# Patient Record
Sex: Female | Born: 1980 | Marital: Single | Smoking: Never smoker
Health system: Southern US, Community
[De-identification: ages and names within clinical notes are randomized; demographics above are authoritative.]

## PROBLEM LIST (undated history)

## (undated) DIAGNOSIS — Z992 Dependence on renal dialysis: Secondary | ICD-10-CM

## (undated) DIAGNOSIS — I10 Essential (primary) hypertension: Secondary | ICD-10-CM

## (undated) DIAGNOSIS — J690 Pneumonitis due to inhalation of food and vomit: Secondary | ICD-10-CM

## (undated) DIAGNOSIS — G40909 Epilepsy, unspecified, not intractable, without status epilepticus: Secondary | ICD-10-CM

## (undated) DIAGNOSIS — I5022 Chronic systolic (congestive) heart failure: Secondary | ICD-10-CM

## (undated) DIAGNOSIS — I469 Cardiac arrest, cause unspecified: Secondary | ICD-10-CM

## (undated) DIAGNOSIS — J9621 Acute and chronic respiratory failure with hypoxia: Secondary | ICD-10-CM

## (undated) DIAGNOSIS — N186 End stage renal disease: Secondary | ICD-10-CM

## (undated) DIAGNOSIS — F819 Developmental disorder of scholastic skills, unspecified: Secondary | ICD-10-CM

## (undated) HISTORY — PX: CLOSED REDUCTION PROXIMAL ULNAR FRACTURE: SUR236

---

## 2018-04-30 ENCOUNTER — Other Ambulatory Visit (HOSPITAL_COMMUNITY): Payer: Self-pay

## 2018-04-30 ENCOUNTER — Inpatient Hospital Stay
Admission: AD | Admit: 2018-04-30 | Discharge: 2018-05-20 | Disposition: A | Discharge status: Skilled Nursing Facility | Payer: Medicare Other | Source: Ambulatory Visit | Attending: Internal Medicine | Admitting: Internal Medicine

## 2018-04-30 DIAGNOSIS — J189 Pneumonia, unspecified organism: Secondary | ICD-10-CM

## 2018-04-30 DIAGNOSIS — Z431 Encounter for attention to gastrostomy: Secondary | ICD-10-CM

## 2018-04-30 DIAGNOSIS — J9621 Acute and chronic respiratory failure with hypoxia: Secondary | ICD-10-CM

## 2018-04-30 DIAGNOSIS — J90 Pleural effusion, not elsewhere classified: Secondary | ICD-10-CM

## 2018-04-30 DIAGNOSIS — R609 Edema, unspecified: Secondary | ICD-10-CM

## 2018-04-30 DIAGNOSIS — Z4659 Encounter for fitting and adjustment of other gastrointestinal appliance and device: Secondary | ICD-10-CM

## 2018-04-30 DIAGNOSIS — I5022 Chronic systolic (congestive) heart failure: Secondary | ICD-10-CM | POA: Diagnosis present

## 2018-04-30 DIAGNOSIS — J690 Pneumonitis due to inhalation of food and vomit: Secondary | ICD-10-CM | POA: Diagnosis present

## 2018-04-30 DIAGNOSIS — Z9911 Dependence on respirator [ventilator] status: Secondary | ICD-10-CM

## 2018-04-30 DIAGNOSIS — Z992 Dependence on renal dialysis: Secondary | ICD-10-CM

## 2018-04-30 DIAGNOSIS — O2230 Deep phlebothrombosis in pregnancy, unspecified trimester: Secondary | ICD-10-CM

## 2018-04-30 DIAGNOSIS — N186 End stage renal disease: Secondary | ICD-10-CM

## 2018-04-30 DIAGNOSIS — T17908A Unspecified foreign body in respiratory tract, part unspecified causing other injury, initial encounter: Secondary | ICD-10-CM

## 2018-04-30 DIAGNOSIS — I469 Cardiac arrest, cause unspecified: Secondary | ICD-10-CM | POA: Diagnosis present

## 2018-04-30 DIAGNOSIS — G40909 Epilepsy, unspecified, not intractable, without status epilepticus: Secondary | ICD-10-CM

## 2018-04-30 HISTORY — DX: Dependence on renal dialysis: N18.6

## 2018-04-30 HISTORY — DX: Developmental disorder of scholastic skills, unspecified: F81.9

## 2018-04-30 HISTORY — DX: Cardiac arrest, cause unspecified: I46.9

## 2018-04-30 HISTORY — DX: Essential (primary) hypertension: I10

## 2018-04-30 HISTORY — DX: Epilepsy, unspecified, not intractable, without status epilepticus: G40.909

## 2018-04-30 HISTORY — DX: Dependence on renal dialysis: Z99.2

## 2018-04-30 HISTORY — DX: Chronic systolic (congestive) heart failure: I50.22

## 2018-04-30 HISTORY — DX: Acute and chronic respiratory failure with hypoxia: J96.21

## 2018-04-30 HISTORY — DX: Pneumonitis due to inhalation of food and vomit: J69.0

## 2018-04-30 LAB — BLOOD GAS, ARTERIAL
Acid-Base Excess: 0.7 mmol/L (ref 0.0–2.0)
Bicarbonate: 25.3 mmol/L (ref 20.0–28.0)
FIO2: 40
MECHVT: 310 mL
O2 SAT: 96.6 %
PEEP: 5 cmH2O
Patient temperature: 97.9
RATE: 18 resp/min
pCO2 arterial: 43.6 mmHg (ref 32.0–48.0)
pH, Arterial: 7.38 (ref 7.350–7.450)
pO2, Arterial: 86.6 mmHg (ref 83.0–108.0)

## 2018-05-01 ENCOUNTER — Other Ambulatory Visit (HOSPITAL_COMMUNITY): Payer: Self-pay

## 2018-05-01 ENCOUNTER — Encounter: Payer: Self-pay | Admitting: Internal Medicine

## 2018-05-01 DIAGNOSIS — I5022 Chronic systolic (congestive) heart failure: Secondary | ICD-10-CM | POA: Diagnosis not present

## 2018-05-01 DIAGNOSIS — I469 Cardiac arrest, cause unspecified: Secondary | ICD-10-CM | POA: Diagnosis present

## 2018-05-01 DIAGNOSIS — G40909 Epilepsy, unspecified, not intractable, without status epilepticus: Secondary | ICD-10-CM

## 2018-05-01 DIAGNOSIS — Z992 Dependence on renal dialysis: Secondary | ICD-10-CM

## 2018-05-01 DIAGNOSIS — J9621 Acute and chronic respiratory failure with hypoxia: Secondary | ICD-10-CM | POA: Diagnosis not present

## 2018-05-01 DIAGNOSIS — J690 Pneumonitis due to inhalation of food and vomit: Secondary | ICD-10-CM | POA: Diagnosis not present

## 2018-05-01 DIAGNOSIS — N186 End stage renal disease: Secondary | ICD-10-CM

## 2018-05-01 LAB — CBC WITH DIFFERENTIAL/PLATELET
ABS IMMATURE GRANULOCYTES: 0.5 10*3/uL — AB (ref 0.0–0.1)
Basophils Absolute: 0.1 10*3/uL (ref 0.0–0.1)
Basophils Relative: 1 %
EOS PCT: 3 %
Eosinophils Absolute: 0.5 10*3/uL (ref 0.0–0.7)
HCT: 28.3 % — ABNORMAL LOW (ref 36.0–46.0)
HEMOGLOBIN: 8.4 g/dL — AB (ref 12.0–15.0)
Immature Granulocytes: 3 %
LYMPHS ABS: 0.7 10*3/uL (ref 0.7–4.0)
Lymphocytes Relative: 4 %
MCH: 26.3 pg (ref 26.0–34.0)
MCHC: 29.7 g/dL — AB (ref 30.0–36.0)
MCV: 88.7 fL (ref 78.0–100.0)
MONO ABS: 1.1 10*3/uL — AB (ref 0.1–1.0)
MONOS PCT: 7 %
NEUTROS ABS: 13.7 10*3/uL — AB (ref 1.7–7.7)
NEUTROS PCT: 82 %
Platelets: 327 10*3/uL (ref 150–400)
RBC: 3.19 MIL/uL — ABNORMAL LOW (ref 3.87–5.11)
RDW: 17.8 % — ABNORMAL HIGH (ref 11.5–15.5)
WBC: 16.6 10*3/uL — ABNORMAL HIGH (ref 4.0–10.5)

## 2018-05-01 LAB — COMPREHENSIVE METABOLIC PANEL
ALT: <5 U/L (ref 0–44)
ANION GAP: 13 (ref 5–15)
AST: 20 U/L (ref 15–41)
Albumin: 3.1 g/dL — ABNORMAL LOW (ref 3.5–5.0)
Alkaline Phosphatase: 119 U/L (ref 38–126)
BUN: 23 mg/dL — ABNORMAL HIGH (ref 6–20)
CHLORIDE: 98 mmol/L (ref 98–111)
CO2: 25 mmol/L (ref 22–32)
Calcium: 7.6 mg/dL — ABNORMAL LOW (ref 8.9–10.3)
Creatinine, Ser: 3.56 mg/dL — ABNORMAL HIGH (ref 0.44–1.00)
GFR calc non Af Amer: 15 mL/min — ABNORMAL LOW (ref >60)
GFR, EST AFRICAN AMERICAN: 18 mL/min — AB (ref >60)
Glucose, Bld: 107 mg/dL — ABNORMAL HIGH (ref 70–99)
Potassium: 3.9 mmol/L (ref 3.5–5.1)
Sodium: 136 mmol/L (ref 135–145)
Total Bilirubin: 0.7 mg/dL (ref 0.3–1.2)
Total Protein: 7.1 g/dL (ref 6.5–8.1)

## 2018-05-01 LAB — PREPARE RBC (CROSSMATCH)

## 2018-05-01 LAB — HEMOGLOBIN AND HEMATOCRIT, BLOOD
HCT: 24.6 % — ABNORMAL LOW (ref 36.0–46.0)
HCT: 23 % — ABNORMAL LOW (ref 36.0–46.0)
Hemoglobin: 7.5 g/dL — ABNORMAL LOW (ref 12.0–15.0)
Hemoglobin: 7 g/dL — ABNORMAL LOW (ref 12.0–15.0)

## 2018-05-01 LAB — PHOSPHORUS: PHOSPHORUS: 3.6 mg/dL (ref 2.5–4.6)

## 2018-05-01 LAB — PROTIME-INR
INR: 1.59
Prothrombin Time: 18.8 seconds — ABNORMAL HIGH (ref 11.4–15.2)

## 2018-05-01 LAB — MAGNESIUM: Magnesium: 2.2 mg/dL (ref 1.7–2.4)

## 2018-05-01 LAB — ABO/RH: ABO/RH(D): A POS

## 2018-05-01 MED ORDER — DOCUSATE SODIUM 100 MG PO CAPS
100.00 | ORAL_CAPSULE | ORAL | Status: DC
Start: ? — End: 2018-05-01

## 2018-05-01 MED ORDER — LEVETIRACETAM 100 MG/ML PO SOLN
500.00 | ORAL | Status: DC
Start: 2018-04-30 — End: 2018-05-01

## 2018-05-01 MED ORDER — ONDANSETRON HCL 4 MG/2ML IJ SOLN
4.00 | INTRAMUSCULAR | Status: DC
Start: ? — End: 2018-05-01

## 2018-05-01 MED ORDER — GENERIC EXTERNAL MEDICATION
2.50 | Status: DC
Start: ? — End: 2018-05-01

## 2018-05-01 MED ORDER — GENERIC EXTERNAL MEDICATION
2.25 g | Status: DC
Start: 2018-04-30 — End: 2018-05-01

## 2018-05-01 MED ORDER — ACETAMINOPHEN 325 MG PO TABS
650.00 | ORAL_TABLET | ORAL | Status: DC
Start: ? — End: 2018-05-01

## 2018-05-01 MED ORDER — TRAMADOL HCL 50 MG PO TABS
50.00 | ORAL_TABLET | ORAL | Status: DC
Start: ? — End: 2018-05-01

## 2018-05-01 MED ORDER — CLONIDINE 0.3 MG/24HR TD PTWK
1.00 | MEDICATED_PATCH | TRANSDERMAL | Status: DC
Start: 2018-05-03 — End: 2018-05-01

## 2018-05-01 MED ORDER — GENERIC EXTERNAL MEDICATION
10.00 | Status: DC
Start: ? — End: 2018-05-01

## 2018-05-01 MED ORDER — ASPIRIN 81 MG PO CHEW
81.00 | CHEWABLE_TABLET | ORAL | Status: DC
Start: 2018-05-01 — End: 2018-05-01

## 2018-05-01 MED ORDER — LISINOPRIL 20 MG PO TABS
40.00 | ORAL_TABLET | ORAL | Status: DC
Start: 2018-05-01 — End: 2018-05-01

## 2018-05-01 MED ORDER — FAMOTIDINE 20 MG PO TABS
20.00 | ORAL_TABLET | ORAL | Status: DC
Start: 2018-05-01 — End: 2018-05-01

## 2018-05-01 MED ORDER — SEVELAMER CARBONATE 800 MG PO TABS
800.00 | ORAL_TABLET | ORAL | Status: DC
Start: 2018-04-30 — End: 2018-05-01

## 2018-05-01 MED ORDER — GENERIC EXTERNAL MEDICATION
300.00 | Status: DC
Start: 2018-04-30 — End: 2018-05-01

## 2018-05-01 MED ORDER — OXYCODONE-ACETAMINOPHEN 5-325 MG PO TABS
2.00 | ORAL_TABLET | ORAL | Status: DC
Start: ? — End: 2018-05-01

## 2018-05-01 MED ORDER — SERTRALINE HCL 25 MG PO TABS
25.00 | ORAL_TABLET | ORAL | Status: DC
Start: 2018-05-01 — End: 2018-05-01

## 2018-05-01 MED ORDER — IPRATROPIUM BROMIDE 0.02 % IN SOLN
0.50 | RESPIRATORY_TRACT | Status: DC
Start: ? — End: 2018-05-01

## 2018-05-01 MED ORDER — FUROSEMIDE 40 MG PO TABS
40.00 | ORAL_TABLET | ORAL | Status: DC
Start: 2018-05-01 — End: 2018-05-01

## 2018-05-01 MED ORDER — GENERIC EXTERNAL MEDICATION
.00 | Status: DC
Start: ? — End: 2018-05-01

## 2018-05-01 MED ORDER — ALBUTEROL SULFATE (5 MG/ML) 0.5% IN NEBU
2.50 | INHALATION_SOLUTION | RESPIRATORY_TRACT | Status: DC
Start: ? — End: 2018-05-01

## 2018-05-01 MED ORDER — OXIDIZED CELLULOSE EX PADS
1.00 | MEDICATED_PAD | CUTANEOUS | Status: DC
Start: ? — End: 2018-05-01

## 2018-05-01 MED ORDER — TEMAZEPAM 15 MG PO CAPS
15.00 | ORAL_CAPSULE | ORAL | Status: DC
Start: ? — End: 2018-05-01

## 2018-05-01 MED ORDER — CLONIDINE HCL 0.1 MG PO TABS
0.30 | ORAL_TABLET | ORAL | Status: DC
Start: ? — End: 2018-05-01

## 2018-05-01 MED ORDER — GENERIC EXTERNAL MEDICATION
Status: DC
Start: ? — End: 2018-05-01

## 2018-05-01 MED ORDER — HEPARIN SODIUM (PORCINE) 5000 UNIT/ML IJ SOLN
5000.00 | INTRAMUSCULAR | Status: DC
Start: 2018-04-30 — End: 2018-05-01

## 2018-05-01 MED ORDER — GENERIC EXTERNAL MEDICATION
75.00 | Status: DC
Start: ? — End: 2018-05-01

## 2018-05-01 MED ORDER — LORAZEPAM 2 MG/ML IJ SOLN
0.50 | INTRAMUSCULAR | Status: DC
Start: ? — End: 2018-05-01

## 2018-05-01 MED ORDER — HYDRALAZINE HCL 20 MG/ML IJ SOLN
10.00 | INTRAMUSCULAR | Status: DC
Start: ? — End: 2018-05-01

## 2018-05-01 MED ORDER — SODIUM CHLORIDE 0.9 % IJ SOLN
5.00 | INTRAMUSCULAR | Status: DC
Start: 2018-04-30 — End: 2018-05-01

## 2018-05-01 MED ORDER — AMLODIPINE BESYLATE 5 MG PO TABS
10.00 | ORAL_TABLET | ORAL | Status: DC
Start: 2018-04-30 — End: 2018-05-01

## 2018-05-01 NOTE — Progress Notes (Signed)
Request for diagnostic and therapeutic thoracentesis for new onset left pleural effusion noted on CXR from 8/1. Limited chest US performed at bedside prior to procedure showing minimal amounts of pleural fluid present in left pleural space which is not amenable to drainage today given risk to patient. No procedure performed today, plan discussed with Dr. Odis LusterBowers.   Lynnette CaffeyShannon Watterson, PA-C 05/01/18 1407

## 2018-05-01 NOTE — Consult Note (Signed)
[facility]  Date of Service: 05/01/2018  PULMONARY CONSULT   Lori NobleMegan C Gutierrez  ZOX:[id number]  DOB: 1981-01-28   DOA: 04/30/2018  Referring Physician: Carron CurieAli Hijazi, MD  HPI: Lori Gutierrez is a 37 y.o. female seen for follow up of Acute on Chronic Respiratory Failure.  Patient is orally intubated on the ventilator.  She is critically ill in danger of cardiac arrest.  Patient apparently has a history of end-stage renal disease on dialysis who gets regular dialysis.  She came into the hospital with increasing swelling.  Apparently the swelling is gotten worse within 24 hours.  The patient had some having pain also in the left upper extremity.  When she was seen in the emergency room she was noted to be significantly hypoxic and basically in acute respiratory failure.  Chest x-ray at the time it showed moderate pulmonary vascular congestion.  The x-ray also was done of the upper extremities does not show any significant fractures or displacements.  Patient was admitted to the floor apparently suffered a cardiac arrest and was transferred to the ICU and intubated at that time.  Patient was felt to have aspirated while eating.  Also patient had developed a seizure at the time.  She was loaded with Keppra and also did get the CPR as mentioned with return of spontaneous circulation.  When she was intubated the in the ET tube food particles were noted.  Patient was empirically started on Zosyn at the time for the aspiration pneumonia.  She is transferred to our facility for further management and weaning.  Review of Systems:  ROS performed and is unremarkable other than noted above.  Past Medical History:  Diagnosis Date  . Acute on chronic respiratory failure with hypoxia (HCC)   . Aspiration pneumonia (HCC)   . Cardiac arrest (HCC)   . Chronic systolic heart failure (HCC)   . Delay of cognitive development   . End stage renal disease  on dialysis (HCC)   . Hypertension   . Seizure disorder [facility](HCC)     Past Surgical History:  Procedure Laterality Date  . CLOSED REDUCTION PROXIMAL ULNAR FRACTURE      Social History:    reports that she has never smoked. She has never used smokeless tobacco. She reports that she drank alcohol. She reports that she has current or past drug history.  Family History: Non-Contributory to the present illness  Allergies not on file  Medications: Reviewed on Rounds  Physical Exam:  Vitals: Temperature 97.9 pulse 85 respiratory rate 19 blood pressure 187/88 saturations 100%  Ventilator Settings mode of ventilation assist control FiO2 40% tidal volume 489 PEEP 5  . General: Comfortable at this time . Eyes: Grossly normal lids, irises & conjunctiva . ENT: grossly tongue is normal . Neck: no obvious mass . Cardiovascular: S1-S2 normal no gallop or rub . Respiratory: Coarse breath sounds are noted bilaterally . Abdomen: Soft and nontender . Skin: no rash seen on limited exam . Musculoskeletal: not rigid . Psychiatric:unable to assess . Neurologic: no seizure no involuntary movements         Labs on Admission:  Basic Metabolic Panel: Recent Labs  Lab 05/01/18 0506  NA 136  K 3.9  CL 98  CO2 25  GLUCOSE 107*  BUN 23*  CREATININE 3.56*  CALCIUM 7.6*  MG 2.2  PHOS 3.6    Liver Function Tests: Recent Labs  Lab 05/01/18 0506  AST 20  ALT <5  ALKPHOS 119  BILITOT 0.7  PROT 7.1  ALBUMIN 3.1*   No results for input(s): LIPASE, AMYLASE in the last 168 hours. No results for input(s): AMMONIA in the last 168 hours.  CBC: Recent Labs  Lab 05/01/18 0506  WBC 16.6*  NEUTROABS 13.7*  HGB 8.4*  HCT 28.3*  MCV 88.7  PLT 327    Cardiac Enzymes: No results for input(s): CKTOTAL, CKMB, CKMBINDEX, TROPONINI in the last 168 hours.  BNP (last 3 results) No results for input(s): BNP in the last 8760 hours.  ProBNP (last 3 results) No results for input(s): PROBNP in  the last 8760 hours.   Radiological Exams on Admission: Dg Abd 1 View  Result Date: 04/30/2018 CLINICAL DATA:  NG tube placement. EXAM: ABDOMEN - 1 VIEW COMPARISON:  04/30/2018 FINDINGS: The bowel gas pattern is normal. Residual oral contrast throughout the colon. Enteric catheter has been advanced, tip likely within the distal gastric body. Patchy airspace consolidation the left lower lobe. Right femoral line in place. IMPRESSION: Enteric catheter tip likely within the distal gastric body. Patchy airspace consolidation in the left lower lobe. Electronically Signed   By: Ted Mcalpine M.D.   On: 04/30/2018 18:25   Dg Abd 1 View  Result Date: 04/30/2018 CLINICAL DATA:  37 year old female with a history of nasogastric tube placement EXAM: ABDOMEN - 1 VIEW COMPARISON:  None. FINDINGS: Retained enteric contrast within the left side of the colon. Scoliotic curvature of the thoracolumbar spine. Cardiomegaly. Hemodialysis catheter. Endotracheal tube partially visualized. Gastric tube terminates in the left upper quadrant, with the side-port projecting at the level of the hemidiaphragm, potentially above the GE junction. Opacity at the left lung base. IMPRESSION: Gastric tube terminates in the stomach, with the side-port likely above the GE junction. Opacity at the left lung base, potentially a combination of atelectasis, pleural fluid, and/or consolidation. Electronically Signed   By: Gilmer Mor D.O.   On: 04/30/2018 15:30   Dg Chest Port 1 View  Result Date: 04/30/2018 CLINICAL DATA:  Encounter for ETT placement. EXAM: PORTABLE CHEST 1 VIEW COMPARISON:  Portable film 04/29/2018. FINDINGS: ET tube has been pulled back and now lies 2.2 cm above the carina, in satisfactory position. LEFT lower lobe consolidation, atelectasis, and effusion are increased. Orogastric tube tip lies in the stomach. Dialysis catheter tips RIGHT atrium. Mild diffuse vascular congestion. IMPRESSION: ET tube in satisfactory  position.  Worsening aeration. Electronically Signed   By: Elsie Stain M.D.   On: 04/30/2018 15:35    Assessment/Plan Principal Problem:   Acute on chronic respiratory failure with hypoxia (HCC) Active Problems:   Aspiration pneumonia (HCC)   Seizure disorder (HCC)   Cardiac arrest (HCC)   End stage renal disease on dialysis (HCC)   Chronic systolic heart failure (HCC)   1. Acute on chronic respiratory failure with hypoxia she currently is orally intubated on the ventilator.  We will have respiratory check the spontaneous breathing index and try to start her on a pressure support wean.  Patient's mechanics right now are borderline will need to monitor closely.  In addition chest x-ray shows some worsening aeration so I think she would benefit from dialysis.  Nephrology is to see the patient today hopefully 2. Pneumonia due to aspiration would continue with antibiotics as she was started on the other facility.  Follow-up x-ray as to as necessary. 3. Seizure disorder currently no active seizures are noted. 4. Cardiac arrest rhythm is stable we will continue with present  management. 5. End-stage renal disease awaiting nephrology consultation for dialysis will continue with supportive care. 6. Chronic systolic heart failure need to monitor her fluid status closely chest x-ray results were reviewed with the primary care team will continue with current management  I have personally seen and evaluated the patient, evaluated laboratory and imaging results, formulated the assessment and plan and placed orders. The Patient requires high complexity decision making for assessment and support.  Case was discussed on Rounds with the Respiratory Therapy Staff Time Spent patient is critically ill in danger of cardiac arrest.  She will need ongoing close monitoring.  Patient case was discussed with the primary care team and case team management.  Yevonne Pax, MD [facility] Pulmonary Critical Care  Medicine Sleep Medicine

## 2018-05-01 NOTE — Consult Note (Signed)
[facility] CONSULT NOTE    Date: 05/01/2018                  [name]  MRN: [id number]  DOB: 1981-07-21  Age / Sex: 37 y.o., female         PCP: Default, Provider, MD                 Service Requesting Consult: Hospitalist                 Reason for Consult: Management of ESRD            History of Present Illness: Patient is a 37 y.o. female with a PMHx of recent acute respiratory failure requiring intubation, aspiration pneumonia, cardiac arrest, chronic systolic heart failure, delay of cognitive development, end-stage renal disease on hemodialysis MWF, hypertension, seizure disorder, who was admitted to [facility] on 04/30/2018 for ongoing management of acute respiratory failure, recent PEA arrest, and end-stage renal disease.  [facility].  She unfortunately suffered a PEA arrest probably secondary to aspiration while eating and then had subsequent seizure episode.  She was also found to have right ulnar fracture and a brace was placed.  She was subsequently transferred here for further rehabilitation and weaning from the ventilator.   Medications: Discharge medications from [facility]: . amLODIPine 10 mg Oral Daily@0900   . aspirin 81 mg Oral Daily@0900   . cloNIDine 1 patch Transdermal Weekly  . famotidine 20 mg Per G Tube Daily@0900   . furosemide 40 mg Oral Daily@0900   . heparin (porcine) 5,000 Units Subcutaneous 3 times per day  . hydrALAZINE 50 mg Oral Once  . hydrALAZINE 75 mg Oral TID  . labetalol 300 mg Oral 3 times per day  . levETIRacetam 500 mg Per OG tube 2 times per day  . lisinopril 40 mg Oral Daily@0900   . piperacillin-tazobactam 2.25 g Intravenous Q8H  . sertraline 25 mg Oral Daily@0900   . sevelamer 800 mg Oral TID WC  . sodium chloride 5 mL Intracatheter 3 times per day       Allergies: Lactose   Past  Medical History: Past Medical History:  Diagnosis Date  . Acute on chronic respiratory failure with hypoxia (HCC)   . Aspiration pneumonia (HCC)   . Cardiac arrest (HCC)   . Chronic systolic heart failure (HCC)   . Delay of cognitive development   . End stage renal disease on dialysis (HCC)   . Hypertension   . Seizure disorder [facility])      Past Surgical History: Past Surgical History:  Procedure Laterality Date  . CLOSED REDUCTION PROXIMAL ULNAR FRACTURE       Family History: Family History  Family history unknown: Yes     Social History: Social History   Socioeconomic History  . Marital status: Single    Spouse name: Not on file  . Number of children: Not on file  . Years of education: Not on file  . Highest education level: Not on file  Occupational History  . Not on file  Social Needs  . Financial resource strain: Not on file  . Food insecurity:    Worry: Not on file    Inability: Not on file  . Transportation needs:    Medical: Not on file    Non-medical: Not on file  Tobacco Use  . Smoking status: Never Smoker  .  Smokeless tobacco: Never Used  Substance and Sexual Activity  . Alcohol use: Not Currently  . Drug use: Not Currently  . Sexual activity: Not Currently  Lifestyle  . Physical activity:    Days per week: Not on file    Minutes per session: Not on file  . Stress: Not on file  Relationships  . Social connections:    Talks on phone: Not on file    Gets together: Not on file    Attends religious service: Not on file    Active member of club or organization: Not on file    Attends meetings of clubs or organizations: Not on file    Relationship status: Not on file  . Intimate partner violence:    Fear of current or ex partner: Not on file    Emotionally abused: Not on file    Physically abused: Not on file    Forced sexual activity: Not on file  Other Topics Concern  . Not on file  Social History Narrative  . Not on file     Review  of Systems: Patient unable to provide as shes intubated  Vital Signs: Temperature 97.9 pulse 85 respirations 19 blood pressure 207/80   Physical Exam: General Critically ill appearing   Head: Normocephalic, atraumatic. NG in place  Eyes: Anicteric, EOMI  Nose: Mucous membranes moist, not inflammed, nonerythematous.  Throat: ETT in place  Neck: Supple, trachea midline.  Lungs:  Normal respiratory effort. Clear to auscultation BL without crackles or wheezes.  Heart: S1S2 no rubs  Abdomen:  Soft, NTND, BS present  Extremities: No pretibial edema.  Neurologic: Arousable, in restraints  Skin: No visible rashes, scars.    Lab results: Basic Metabolic Panel: Recent Labs  Lab 05/01/18 0506  NA 136  K 3.9  CL 98  CO2 25  GLUCOSE 107*  BUN 23*  CREATININE 3.56*  CALCIUM 7.6*  MG 2.2  PHOS 3.6    Liver Function Tests: Recent Labs  Lab 05/01/18 0506  AST 20  ALT <5  ALKPHOS 119  BILITOT 0.7  PROT 7.1  ALBUMIN 3.1*   No results for input(s): LIPASE, AMYLASE in the last 168 hours. No results for input(s): AMMONIA in the last 168 hours.  CBC: Recent Labs  Lab 05/01/18 0506 05/01/18 1004  WBC 16.6*  --   NEUTROABS 13.7*  --   HGB 8.4* 7.5*  HCT 28.3* 24.6*  MCV 88.7  --   PLT 327  --     Cardiac Enzymes: No results for input(s): CKTOTAL, CKMB, CKMBINDEX, TROPONINI in the last 168 hours.  BNP: Invalid input(s): POCBNP  CBG: No results for input(s): GLUCAP in the last 168 hours.  Microbiology: No results found for this or any previous visit.  Coagulation Studies: Recent Labs    05/01/18 0506  LABPROT 18.8*  INR 1.59    Urinalysis: No results for input(s): COLORURINE, LABSPEC, PHURINE, GLUCOSEU, HGBUR, BILIRUBINUR, KETONESUR, PROTEINUR, UROBILINOGEN, NITRITE, LEUKOCYTESUR in the last 72 hours.  Invalid input(s): APPERANCEUR    Imaging: Dg Abd 1 View  Result Date: 04/30/2018 CLINICAL DATA:  NG tube placement. EXAM: ABDOMEN - 1 VIEW COMPARISON:   04/30/2018 FINDINGS: The bowel gas pattern is normal. Residual oral contrast throughout the colon. Enteric catheter has been advanced, tip likely within the distal gastric body. Patchy airspace consolidation the left lower lobe. Right femoral line in place. IMPRESSION: Enteric catheter tip likely within the distal gastric body. Patchy airspace consolidation in the left lower lobe. Electronically  Signed   By: Ted Mcalpineobrinka  Dimitrova M.D.   On: 04/30/2018 18:25   Dg Abd 1 View  Result Date: 04/30/2018 CLINICAL DATA:  37 year old female with a history of nasogastric tube placement EXAM: ABDOMEN - 1 VIEW COMPARISON:  None. FINDINGS: Retained enteric contrast within the left side of the colon. Scoliotic curvature of the thoracolumbar spine. Cardiomegaly. Hemodialysis catheter. Endotracheal tube partially visualized. Gastric tube terminates in the left upper quadrant, with the side-port projecting at the level of the hemidiaphragm, potentially above the GE junction. Opacity at the left lung base. IMPRESSION: Gastric tube terminates in the stomach, with the side-port likely above the GE junction. Opacity at the left lung base, potentially a combination of atelectasis, pleural fluid, and/or consolidation. Electronically Signed   By: Gilmer MorJaime  Wagner D.O.   On: 04/30/2018 15:30   Dg Chest Port 1 View  Result Date: 04/30/2018 CLINICAL DATA:  Encounter for ETT placement. EXAM: PORTABLE CHEST 1 VIEW COMPARISON:  Portable film 04/29/2018. FINDINGS: ET tube has been pulled back and now lies 2.2 cm above the carina, in satisfactory position. LEFT lower lobe consolidation, atelectasis, and effusion are increased. Orogastric tube tip lies in the stomach. Dialysis catheter tips RIGHT atrium. Mild diffuse vascular congestion. IMPRESSION: ET tube in satisfactory position.  Worsening aeration. Electronically Signed   By: Elsie StainJohn T Curnes M.D.   On: 04/30/2018 15:35      Assessment & Plan: Pt is a 37 y.o. female with a PMHx of recent  acute respiratory failure requiring intubation, aspiration pneumonia, cardiac arrest, chronic systolic heart failure, delay of cognitive development, end-stage renal disease on hemodialysis MWF, hypertension, seizure disorder, who was admitted to [facility] on 04/30/2018 for ongoing management of acute respiratory failure, recent PEA arrest, and end-stage renal disease.   1.  ESRD on HD.  Serum electrolytes appear acceptable at the moment and patient does not appear volume overloaded.  Therefore we will proceed with hemodialysis treatment tomorrow.  We will prepare orders for this.  2.  Acute respiratory failure.  Secondary to aspiration.  Patient maintain on the ventilator at this time.  Continue ventilatory support.  3.  Anemia of chronic kidney disease.  Hemoglobin currently 7.5.  Consider erythropoietin stimulating agents early next week.  4.  Secondary hyperparathyroidism.  Check PTH, phosphorus today.  5.  Thanks for consultation.

## 2018-05-02 DIAGNOSIS — I5022 Chronic systolic (congestive) heart failure: Secondary | ICD-10-CM | POA: Diagnosis not present

## 2018-05-02 DIAGNOSIS — J9621 Acute and chronic respiratory failure with hypoxia: Secondary | ICD-10-CM | POA: Diagnosis not present

## 2018-05-02 DIAGNOSIS — J90 Pleural effusion, not elsewhere classified: Secondary | ICD-10-CM

## 2018-05-02 DIAGNOSIS — I469 Cardiac arrest, cause unspecified: Secondary | ICD-10-CM | POA: Diagnosis not present

## 2018-05-02 DIAGNOSIS — J690 Pneumonitis due to inhalation of food and vomit: Secondary | ICD-10-CM | POA: Diagnosis not present

## 2018-05-02 LAB — RENAL FUNCTION PANEL
ANION GAP: 15 (ref 5–15)
Albumin: 2.8 g/dL — ABNORMAL LOW (ref 3.5–5.0)
BUN: 32 mg/dL — ABNORMAL HIGH (ref 6–20)
CO2: 24 mmol/L (ref 22–32)
Calcium: 7.5 mg/dL — ABNORMAL LOW (ref 8.9–10.3)
Chloride: 93 mmol/L — ABNORMAL LOW (ref 98–111)
Creatinine, Ser: 3.99 mg/dL — ABNORMAL HIGH (ref 0.44–1.00)
GFR, EST AFRICAN AMERICAN: 16 mL/min — AB (ref >60)
GFR, EST NON AFRICAN AMERICAN: 13 mL/min — AB (ref >60)
Glucose, Bld: 100 mg/dL — ABNORMAL HIGH (ref 70–99)
PHOSPHORUS: 4.3 mg/dL (ref 2.5–4.6)
Potassium: 4.1 mmol/L (ref 3.5–5.1)
SODIUM: 132 mmol/L — AB (ref 135–145)

## 2018-05-02 LAB — CBC
HCT: 25.7 % — ABNORMAL LOW (ref 36.0–46.0)
HEMOGLOBIN: 8 g/dL — AB (ref 12.0–15.0)
MCH: 27 pg (ref 26.0–34.0)
MCHC: 31.1 g/dL (ref 30.0–36.0)
MCV: 86.8 fL (ref 78.0–100.0)
Platelets: 289 10*3/uL (ref 150–400)
RBC: 2.96 MIL/uL — AB (ref 3.87–5.11)
RDW: 17.8 % — ABNORMAL HIGH (ref 11.5–15.5)
WBC: 12.9 10*3/uL — AB (ref 4.0–10.5)

## 2018-05-02 LAB — HEPATITIS B SURFACE ANTIBODY,QUALITATIVE: HEP B S AB: REACTIVE

## 2018-05-02 LAB — HEPATITIS B CORE ANTIBODY, TOTAL: Hep B Core Total Ab: NEGATIVE

## 2018-05-02 LAB — CALCIUM, IONIZED: CALCIUM, IONIZED, SERUM: 3.9 mg/dL — AB (ref 4.5–5.6)

## 2018-05-02 LAB — PTH, INTACT AND CALCIUM
Calcium, Total (PTH): 7.3 mg/dL — ABNORMAL LOW (ref 8.7–10.2)
PTH: 166 pg/mL — ABNORMAL HIGH (ref 15–65)

## 2018-05-02 LAB — PARATHYROID HORMONE, INTACT (NO CA): PTH: 131 pg/mL — ABNORMAL HIGH (ref 15–65)

## 2018-05-02 LAB — HEPATITIS B SURFACE ANTIGEN: Hepatitis B Surface Ag: NEGATIVE

## 2018-05-02 NOTE — Progress Notes (Signed)
[facility]  PROGRESS NOTE  Date of Service: 05/02/2018  Lori Gutierrez  ZOX:[id number]  DOB: Feb 06, 1981   DOA: 04/30/2018  Referring Physician: Carron CurieAli Hijazi, MD  HPI: Lori Gutierrez is a 37 y.o. female seen for follow up of Acute on Chronic Respiratory Failure.  Patient remains on the ventilator full support right now has been on assist control mode being dialyzed at this time  Medications: Reviewed on Rounds  Physical Exam:  Vitals: Temperature 96.1 pulse 84 respiratory 18 blood pressure 122/85 saturations 100%  Ventilator Settings mode of ventilation assist control FiO2 40% tidal volume 456 PEEP 5  . General: Comfortable at this time . Eyes: Grossly normal lids, irises & conjunctiva . ENT: grossly tongue is normal . Neck: no obvious mass . Cardiovascular: S1 S2 normal no gallop . Respiratory: No rhonchi or rales . Abdomen: soft . Skin: no rash seen on limited exam . Musculoskeletal: not rigid . Psychiatric:unable to assess . Neurologic: no seizure no involuntary movements         Lab Data:   Basic Metabolic Panel: Recent Labs  Lab 05/01/18 0506 05/01/18 2346  NA 136 132*  K 3.9 4.1  CL 98 93*  CO2 25 24  GLUCOSE 107* 100*  BUN 23* 32*  CREATININE 3.56* 3.99*  CALCIUM 7.6* 7.5*  MG 2.2  --   PHOS 3.6 4.3    Liver Function Tests: Recent Labs  Lab 05/01/18 0506 05/01/18 2346  AST 20  --   ALT <5  --   ALKPHOS 119  --   BILITOT 0.7  --   PROT 7.1  --   ALBUMIN 3.1* 2.8*   No results for input(s): LIPASE, AMYLASE in the last 168 hours. No results for input(s): AMMONIA in the last 168 hours.  CBC: Recent Labs  Lab 05/01/18 0506 05/01/18 1004 05/01/18 1845 05/01/18 2346  WBC 16.6*  --   --  12.9*  NEUTROABS 13.7*  --   --   --   HGB 8.4* 7.5* 7.0* 8.0*  HCT 28.3* 24.6* 23.0* 25.7*  MCV 88.7  --   --  86.8  PLT 327  --   --  289    Cardiac Enzymes: No results for  input(s): CKTOTAL, CKMB, CKMBINDEX, TROPONINI in the last 168 hours.  BNP (last 3 results) No results for input(s): BNP in the last 8760 hours.  ProBNP (last 3 results) No results for input(s): PROBNP in the last 8760 hours.  Radiological Exams: Dg Abd 1 View  Result Date: 04/30/2018 CLINICAL DATA:  NG tube placement. EXAM: ABDOMEN - 1 VIEW COMPARISON:  04/30/2018 FINDINGS: The bowel gas pattern is normal. Residual oral contrast throughout the colon. Enteric catheter has been advanced, tip likely within the distal gastric body. Patchy airspace consolidation the left lower lobe. Right femoral line in place. IMPRESSION: Enteric catheter tip likely within the distal gastric body. Patchy airspace consolidation in the left lower lobe. Electronically Signed   By: Ted Mcalpineobrinka  Dimitrova M.D.   On: 04/30/2018 18:25   Dg Abd 1 View  Result Date: 04/30/2018 CLINICAL DATA:  37 year old female with a history of nasogastric tube placement EXAM: ABDOMEN - 1 VIEW COMPARISON:  None. FINDINGS: Retained enteric contrast within the left side of the colon. Scoliotic curvature of the thoracolumbar spine. Cardiomegaly. Hemodialysis catheter. Endotracheal tube partially visualized. Gastric tube terminates in the left upper quadrant, with the side-port projecting at the level of the hemidiaphragm,  potentially above the GE junction. Opacity at the left lung base. IMPRESSION: Gastric tube terminates in the stomach, with the side-port likely above the GE junction. Opacity at the left lung base, potentially a combination of atelectasis, pleural fluid, and/or consolidation. Electronically Signed   By: Gilmer Mor D.O.   On: 04/30/2018 15:30   [place] Chest (pleural Effusion)  Result Date: 05/01/2018 CLINICAL DATA:  37 year old female with a left-sided pleural effusion presents for attempted thoracentesis. EXAM: CHEST ULTRASOUND COMPARISON:  Chest x-ray 04/30/2018 FINDINGS: Sonographic interrogation of the left chest demonstrates a  trace pleural effusion. There is insufficient fluid for thoracentesis. IMPRESSION: Trace left pleural effusion, insufficient for thoracentesis. Electronically Signed   By: Malachy Moan M.D.   On: 05/01/2018 16:50   Dg Chest Port 1 View  Result Date: 04/30/2018 CLINICAL DATA:  Encounter for ETT placement. EXAM: PORTABLE CHEST 1 VIEW COMPARISON:  Portable film 04/29/2018. FINDINGS: ET tube has been pulled back and now lies 2.2 cm above the carina, in satisfactory position. LEFT lower lobe consolidation, atelectasis, and effusion are increased. Orogastric tube tip lies in the stomach. Dialysis catheter tips RIGHT atrium. Mild diffuse vascular congestion. IMPRESSION: ET tube in satisfactory position.  Worsening aeration. Electronically Signed   By: Elsie Stain M.D.   On: 04/30/2018 15:35    Assessment/Plan Principal Problem:   Acute on chronic respiratory failure with hypoxia (HCC) Active Problems:   Aspiration pneumonia (HCC)   Seizure disorder (HCC)   Cardiac arrest (HCC)   End stage renal disease on dialysis (HCC)   Chronic systolic heart failure (HCC)   1. Acute on chronic respiratory failure with hypoxia doing fine with the current vent settings.  Respiratory therapy will assess the spontaneous breathing index and try to start weaning. 2. Pneumonia due to aspiration treated we will monitor follow-up x-rays 3. Seizure disorder no active seizures noted at this time 4. Status post cardiac arrest rhythm is stable 5. End-stage renal disease on hemodialysis we will continue to follow 6. Chronic systolic heart failure she had bilateral effusions noted on the x-ray right now radiology does not appear to think that she has enough fluid to be drained   I have personally seen and evaluated the patient, evaluated laboratory and imaging results, formulated the assessment and plan and placed orders. The Patient requires high complexity decision making for assessment and support.  Case was  discussed on Rounds with the Respiratory Therapy Staff  Yevonne Pax, MD [facility] Pulmonary Critical Care Medicine Sleep Medicine

## 2018-05-03 DIAGNOSIS — J9621 Acute and chronic respiratory failure with hypoxia: Secondary | ICD-10-CM | POA: Diagnosis not present

## 2018-05-03 DIAGNOSIS — I5022 Chronic systolic (congestive) heart failure: Secondary | ICD-10-CM | POA: Diagnosis not present

## 2018-05-03 DIAGNOSIS — I469 Cardiac arrest, cause unspecified: Secondary | ICD-10-CM | POA: Diagnosis not present

## 2018-05-03 DIAGNOSIS — J690 Pneumonitis due to inhalation of food and vomit: Secondary | ICD-10-CM | POA: Diagnosis not present

## 2018-05-03 LAB — CULTURE, RESPIRATORY

## 2018-05-03 LAB — PTH, INTACT AND CALCIUM
CALCIUM TOTAL (PTH): 7.2 mg/dL — AB (ref 8.7–10.2)
PTH: 130 pg/mL — ABNORMAL HIGH (ref 15–65)

## 2018-05-03 LAB — HEMOGLOBIN AND HEMATOCRIT, BLOOD
HEMATOCRIT: 24.1 % — AB (ref 36.0–46.0)
Hemoglobin: 7.3 g/dL — ABNORMAL LOW (ref 12.0–15.0)

## 2018-05-03 LAB — CULTURE, RESPIRATORY W GRAM STAIN: Culture: NORMAL

## 2018-05-03 NOTE — Progress Notes (Signed)
Pulmonary Critical Care Medicine [facility] [place]   PULMONARY SERVICE  PROGRESS NOTE  Date of Service: 05/03/2018  Lori Gutierrez  ZOX:[id number]  DOB: [birth year]   DOA: 04/30/2018  Referring Physician: Carron Curie, MD  HPI: Lori Gutierrez is a 37 y.o. female seen for follow up of Acute on Chronic Respiratory Failure.  At this time no distress patient is on 40% FiO2 with a PEEP of 5  Medications: Reviewed on Rounds  Physical Exam:  Vitals: Temperature 97.4 pulse 69 respiratory 18 blood pressure 118/73 saturations 100%  Ventilator Settings mode of ventilation assist control FiO2 40% tidal volume 300 PEEP 5  . General: Comfortable at this time . Eyes: Grossly normal lids, irises & conjunctiva . ENT: grossly tongue is normal . Neck: no obvious mass . Cardiovascular: S1 S2 normal no gallop . Respiratory: No rhonchi or rales are noted at this time . Abdomen: soft . Skin: no rash seen on limited exam . Musculoskeletal: not rigid . Psychiatric:unable to assess . Neurologic: no seizure no involuntary movements         Lab Data:   Basic Metabolic Panel: Recent Labs  Lab 05/01/18 0506 05/01/18 2346  NA 136 132*  K 3.9 4.1  CL 98 93*  CO2 25 24  GLUCOSE 107* 100*  BUN 23* 32*  CREATININE 3.56* 3.99*  CALCIUM 7.6*  7.3* 7.5*  MG 2.2  --   PHOS 3.6 4.3    Liver Function Tests: Recent Labs  Lab 05/01/18 0506 05/01/18 2346  AST 20  --   ALT <5  --   ALKPHOS 119  --   BILITOT 0.7  --   PROT 7.1  --   ALBUMIN 3.1* 2.8*   No results for input(s): LIPASE, AMYLASE in the last 168 hours. No results for input(s): AMMONIA in the last 168 hours.  CBC: Recent Labs  Lab 05/01/18 0506 05/01/18 1004 05/01/18 1845 05/01/18 2346 05/03/18 0622  WBC 16.6*  --   --  12.9*  --   NEUTROABS 13.7*  --   --   --   --   HGB 8.4* 7.5* 7.0* 8.0* 7.3*  HCT 28.3* 24.6* 23.0* 25.7* 24.1*  MCV 88.7  --   --  86.8  --   PLT 327  --   --  289  --     Cardiac  Enzymes: No results for input(s): CKTOTAL, CKMB, CKMBINDEX, TROPONINI in the last 168 hours.  BNP (last 3 results) No results for input(s): BNP in the last 8760 hours.  ProBNP (last 3 results) No results for input(s): PROBNP in the last 8760 hours.  Radiological Exams: [place] Chest (pleural Effusion)  Result Date: 05/01/2018 CLINICAL DATA:  37 year old female with a left-sided pleural effusion presents for attempted thoracentesis. EXAM: CHEST ULTRASOUND COMPARISON:  Chest x-ray 04/30/2018 FINDINGS: Sonographic interrogation of the left chest demonstrates a trace pleural effusion. There is insufficient fluid for thoracentesis. IMPRESSION: Trace left pleural effusion, insufficient for thoracentesis. Electronically Signed   By: Malachy Moan M.D.   On: 05/01/2018 16:50    Assessment/Plan Principal Problem:   Acute on chronic respiratory failure with hypoxia (HCC) Active Problems:   Aspiration pneumonia (HCC)   Seizure disorder (HCC)   Cardiac arrest (HCC)   End stage renal disease on dialysis (HCC)   Chronic systolic heart failure (HCC)   1. Acute on chronic respiratory failure with hypoxia patient is comfortable at this time on full support.  Mechanics are poor she is not  able to wean.  Recommended getting an ENT consultation for tracheostomy as she is not likely able to come off the ventilator. 2. Pneumonia due to aspiration treated we will continue to follow 3. Seizure disorder continue present management. 4. Cardiac arrest at baseline no arrhythmias 5. End-stage renal disease on dialysis will follow 6. Chronic systolic heart failure compensated   I have personally seen and evaluated the patient, evaluated laboratory and imaging results, formulated the assessment and plan and placed orders. The Patient requires high complexity decision making for assessment and support.  Case was discussed on Rounds with the Respiratory Therapy Staff  Yevonne PaxSaadat A Khan, MD [facility] Pulmonary Critical  Care Medicine Sleep Medicine

## 2018-05-04 DIAGNOSIS — J9621 Acute and chronic respiratory failure with hypoxia: Secondary | ICD-10-CM | POA: Diagnosis not present

## 2018-05-04 DIAGNOSIS — I5022 Chronic systolic (congestive) heart failure: Secondary | ICD-10-CM | POA: Diagnosis not present

## 2018-05-04 DIAGNOSIS — I469 Cardiac arrest, cause unspecified: Secondary | ICD-10-CM | POA: Diagnosis not present

## 2018-05-04 DIAGNOSIS — J690 Pneumonitis due to inhalation of food and vomit: Secondary | ICD-10-CM | POA: Diagnosis not present

## 2018-05-04 LAB — HEPATITIS B CORE ANTIBODY, IGM: Hep B C IgM: NEGATIVE

## 2018-05-04 LAB — HEPATITIS B SURFACE ANTIGEN: Hepatitis B Surface Ag: NEGATIVE

## 2018-05-04 LAB — HEPATITIS B SURFACE ANTIBODY, QUANTITATIVE: HEPATITIS B-POST: 112.5 m[IU]/mL

## 2018-05-04 LAB — HEMOGLOBIN AND HEMATOCRIT, BLOOD
HEMATOCRIT: 25.4 % — AB (ref 36.0–46.0)
HEMOGLOBIN: 7.7 g/dL — AB (ref 12.0–15.0)

## 2018-05-04 NOTE — Progress Notes (Signed)
[facility] [place]   PULMONARY SERVICE  PROGRESS NOTE  Date of Service: 05/04/2018  Lori Gutierrez  ZOX:[id number]:[id number]  DOB: Jan 24, 1981   DOA: 04/30/2018  Referring Physician: Carron CurieAli Hijazi, MD  HPI: Lori Gutierrez is a 37 y.o. female seen for follow up of Acute on Chronic Respiratory Failure.  Patient is resting comfortably without distress remains on assist control mode patient's been on 40% oxygen with good saturations.  Medications: Reviewed on Rounds  Physical Exam:  Vitals: Temperature is 96.5 pulse 80 respiratory rate 24 blood pressure 140/95 oxygen saturations are 95%  Ventilator Settings mode of ventilation assist control FiO2 40% tidal volume 310 PEEP 5  . General: Comfortable at this time . Eyes: Grossly normal lids, irises & conjunctiva . ENT: grossly tongue is normal . Neck: no obvious mass . Cardiovascular: S1 S2 normal no gallop . Respiratory: Coarse breath sounds no rhonchi are noted at this time . Abdomen: soft . Skin: no rash seen on limited exam . Musculoskeletal: not rigid . Psychiatric:unable to assess . Neurologic: no seizure no involuntary movements         Lab Data:   Basic Metabolic Panel: Recent Labs  Lab 05/01/18 0506 05/01/18 2346  NA 136 132*  K 3.9 4.1  CL 98 93*  CO2 25 24  GLUCOSE 107* 100*  BUN 23* 32*  CREATININE 3.56* 3.99*  CALCIUM 7.6*  7.3* 7.5*  7.2*  MG 2.2  --   PHOS 3.6 4.3    Liver Function Tests: Recent Labs  Lab 05/01/18 0506 05/01/18 2346  AST 20  --   ALT <5  --   ALKPHOS 119  --   BILITOT 0.7  --   PROT 7.1  --   ALBUMIN 3.1* 2.8*   No results for input(s): LIPASE, AMYLASE in the last 168 hours. No results for input(s): AMMONIA in the last 168 hours.  CBC: Recent Labs  Lab 05/01/18 0506 05/01/18 1004 05/01/18 1845 05/01/18 2346 05/03/18 0622 05/04/18 0632  WBC 16.6*  --   --  12.9*  --   --   NEUTROABS 13.7*  --   --   --   --   --   HGB 8.4* 7.5* 7.0*  8.0* 7.3* 7.7*  HCT 28.3* 24.6* 23.0* 25.7* 24.1* 25.4*  MCV 88.7  --   --  86.8  --   --   PLT 327  --   --  289  --   --     Cardiac Enzymes: No results for input(s): CKTOTAL, CKMB, CKMBINDEX, TROPONINI in the last 168 hours.  BNP (last 3 results) No results for input(s): BNP in the last 8760 hours.  ProBNP (last 3 results) No results for input(s): PROBNP in the last 8760 hours.  Radiological Exams: No results found.  Assessment/Plan Principal Problem:   Acute on chronic respiratory failure with hypoxia (HCC) Active Problems:   Aspiration pneumonia (HCC)   Seizure disorder (HCC)   Cardiac arrest (HCC)   End stage renal disease on dialysis (HCC)   Chronic systolic heart failure (HCC)   1. Acute on chronic respiratory failure with hypoxia patient is going to continue with full vent support currently on assist control has been on PEEP of 5 patient's tidal volumes of 310 cc.  We will recheck the spontaneous breathing index to see if she is amenable she is failed mall several times.  Continue with supportive care.  ET tube is in place she will  need a tracheostomy as previously noted 2. Aspiration pneumonia treated with antibiotics we will continue present management 3. Seizure disorder no active seizures are noted at this time. 4. Cardiac arrest rhythm is stable 5. End-stage renal disease on hemodialysis followed by nephrology 6. Chronic systolic heart failure appears to be compensated at this time.   I have personally seen and evaluated the patient, evaluated laboratory and imaging results, formulated the assessment and plan and placed orders. The Patient requires high complexity decision making for assessment and support.  Case was discussed on Rounds with the Respiratory Therapy Staff time 35 minutes multidisciplinary rounds, discussion and also discussion with primary care team  Yevonne Pax, MD [facility] Pulmonary Critical Care Medicine Sleep Medicine

## 2018-05-04 NOTE — Progress Notes (Signed)
[facility]  ROUNDING NOTE   Subjective:  Patient remains critically ill. Still on the ventilator. FiO2 35%.   Objective:  Vital signs in last 24 hours:  Temperature 96.5 pulse 80 respirations 24 blood pressure 140/100  Physical Exam: General: Critically ill appearing  Head: [place]/AT ETT in place  Eyes: Anicteric  Neck: Supple, trachea midline  Lungs:  Scattered rhonchi, vent assisted  Heart: S1S2 no rubs  Abdomen:  Soft, nontender, bowel sounds present  Extremities: trace peripheral edema.  Neurologic: Awake, not following commands  Skin: No lesions       Basic Metabolic Panel: Recent Labs  Lab 05/01/18 0506 05/01/18 2346  NA 136 132*  K 3.9 4.1  CL 98 93*  CO2 25 24  GLUCOSE 107* 100*  BUN 23* 32*  CREATININE 3.56* 3.99*  CALCIUM 7.6*  7.3* 7.5*  7.2*  MG 2.2  --   PHOS 3.6 4.3    Liver Function Tests: Recent Labs  Lab 05/01/18 0506 05/01/18 2346  AST 20  --   ALT <5  --   ALKPHOS 119  --   BILITOT 0.7  --   PROT 7.1  --   ALBUMIN 3.1* 2.8*   No results for input(s): LIPASE, AMYLASE in the last 168 hours. No results for input(s): AMMONIA in the last 168 hours.  CBC: Recent Labs  Lab 05/01/18 0506 05/01/18 1004 05/01/18 1845 05/01/18 2346 05/03/18 0622 05/04/18 0632  WBC 16.6*  --   --  12.9*  --   --   NEUTROABS 13.7*  --   --   --   --   --   HGB 8.4* 7.5* 7.0* 8.0* 7.3* 7.7*  HCT 28.3* 24.6* 23.0* 25.7* 24.1* 25.4*  MCV 88.7  --   --  86.8  --   --   PLT 327  --   --  289  --   --     Cardiac Enzymes: No results for input(s): CKTOTAL, CKMB, CKMBINDEX, TROPONINI in the last 168 hours.  BNP: Invalid input(s): POCBNP  CBG: No results for input(s): GLUCAP in the last 168 hours.  Microbiology: Results for orders placed or performed during the hospital encounter of 04/30/18  Culture, respiratory (non-expectorated)     Status: None   Collection Time: 05/01/18  7:52 AM  Result Value Ref Range Status   Specimen  Description TRACHEAL ASPIRATE  Final   Special Requests NONE  Final   Gram Stain   Final    ABUNDANT WBC PRESENT,BOTH PMN AND MONONUCLEAR NO ORGANISMS SEEN    Culture   Final    RARE Consistent with normal respiratory flora. Performed at [facility], [address] [place], [place] [address]    Report Status 05/03/2018 FINAL  Final    Coagulation Studies: No results for input(s): LABPROT, INR in the last 72 hours.  Urinalysis: No results for input(s): COLORURINE, LABSPEC, PHURINE, GLUCOSEU, HGBUR, BILIRUBINUR, KETONESUR, PROTEINUR, UROBILINOGEN, NITRITE, LEUKOCYTESUR in the last 72 hours.  Invalid input(s): APPERANCEUR    Imaging: No results found.   Medications:       Assessment/ Plan:  37 y.o. female with a PMHx of recent acute respiratory failure requiring intubation, aspiration pneumonia, cardiac arrest, chronic systolic heart failure, delay of cognitive development, end-stage renal disease on hemodialysis MWF, hypertension, seizure disorder, who was admitted to [facility] on 04/30/2018 for ongoing management of acute respiratory failure, recent PEA arrest, and end-stage renal disease.   1.  ESRD on HD.  We will maintain patient on hemodialysis schedule of Tuesday, Thursday, Saturday.  Therefore next treatment tomorrow.  2.  Acute respiratory failure.  Secondary to aspiration.   -Patient still requiring ventilatory support.  Continue mechanical ventilation at this time.  3.  Anemia of chronic kidney disease.    Hemoglobin 7.7.  Start the patient on retacrit [id number] units IV with HD.   4.  Secondary hyperparathyroidism.  Phosphorus 4.3 and at target.  Repeat value tomorrow.     LOS: 0 Fynley Chrystal 8/5/20198:28 AM

## 2018-05-05 DIAGNOSIS — I5022 Chronic systolic (congestive) heart failure: Secondary | ICD-10-CM | POA: Diagnosis not present

## 2018-05-05 DIAGNOSIS — J690 Pneumonitis due to inhalation of food and vomit: Secondary | ICD-10-CM | POA: Diagnosis not present

## 2018-05-05 DIAGNOSIS — I469 Cardiac arrest, cause unspecified: Secondary | ICD-10-CM | POA: Diagnosis not present

## 2018-05-05 DIAGNOSIS — J9621 Acute and chronic respiratory failure with hypoxia: Secondary | ICD-10-CM | POA: Diagnosis not present

## 2018-05-05 LAB — CBC
HEMATOCRIT: 23.7 % — AB (ref 36.0–46.0)
HEMOGLOBIN: 7.4 g/dL — AB (ref 12.0–15.0)
MCH: 26.6 pg (ref 26.0–34.0)
MCHC: 31.2 g/dL (ref 30.0–36.0)
MCV: 85.3 fL (ref 78.0–100.0)
Platelets: 335 10*3/uL (ref 150–400)
RBC: 2.78 MIL/uL — ABNORMAL LOW (ref 3.87–5.11)
RDW: 18.5 % — AB (ref 11.5–15.5)
WBC: 13.6 10*3/uL — ABNORMAL HIGH (ref 4.0–10.5)

## 2018-05-05 LAB — RENAL FUNCTION PANEL
ALBUMIN: 2.6 g/dL — AB (ref 3.5–5.0)
ANION GAP: 17 — AB (ref 5–15)
BUN: 45 mg/dL — AB (ref 6–20)
CO2: 23 mmol/L (ref 22–32)
Calcium: 8 mg/dL — ABNORMAL LOW (ref 8.9–10.3)
Chloride: 89 mmol/L — ABNORMAL LOW (ref 98–111)
Creatinine, Ser: 3.99 mg/dL — ABNORMAL HIGH (ref 0.44–1.00)
GFR calc Af Amer: 16 mL/min — ABNORMAL LOW (ref >60)
GFR calc non Af Amer: 13 mL/min — ABNORMAL LOW (ref >60)
GLUCOSE: 94 mg/dL (ref 70–99)
PHOSPHORUS: 4.6 mg/dL (ref 2.5–4.6)
POTASSIUM: 5.7 mmol/L — AB (ref 3.5–5.1)
Sodium: 129 mmol/L — ABNORMAL LOW (ref 135–145)

## 2018-05-05 LAB — BPAM RBC
BLOOD PRODUCT EXPIRATION DATE: 201908262359
Blood Product Expiration Date: 201908262359
ISSUE DATE / TIME: 201908020146
UNIT TYPE AND RH: 6200
UNIT TYPE AND RH: 6200

## 2018-05-05 LAB — TYPE AND SCREEN
ABO/RH(D): A POS
ANTIBODY SCREEN: NEGATIVE
UNIT DIVISION: 0
Unit division: 0

## 2018-05-05 NOTE — Progress Notes (Addendum)
Pulmonary Critical Care Medicine [facility] [place]   PULMONARY SERVICE  PROGRESS NOTE  Date of Service: 05/05/2018  Lori Gutierrez  ZOX:[id number]  DOB: [birth year]   DOA: 04/30/2018  Referring Physician: Carron Curie, MD  HPI: Lori Gutierrez is a 37 y.o. female seen for follow up of Acute on Chronic Respiratory Failure.  Patient is currently on full support has been on assist control mode has not been attempted on pressure support and has failed.  Patient has been on 30% oxygen.  Patient remains critically ill she is on propofol drip for sedation.  She was a little bit more awake.  Patient was orally intubated  Medications: Reviewed on Rounds  Physical Exam:  Vitals: Temperature 97.1 pulse 86 respiratory rate 29 blood pressure 154/102 saturations 95%  Ventilator Settings currently on assist control FiO2 30% tidal volume 590 PEEP 5  . General: Comfortable at this time . Eyes: Grossly normal lids, irises & conjunctiva . ENT: grossly tongue is normal . Neck: no obvious mass . Cardiovascular: S1 S2 normal no gallop . Respiratory: No rhonchi or rales are noted . Abdomen: soft . Skin: no rash seen on limited exam . Musculoskeletal: not rigid . Psychiatric:unable to assess . Neurologic: no seizure no involuntary movements         Lab Data:   Basic Metabolic Panel: Recent Labs  Lab 05/01/18 0506 05/01/18 2346 05/05/18 0654  NA 136 132* 129*  K 3.9 4.1 5.7*  CL 98 93* 89*  CO2 25 24 23   GLUCOSE 107* 100* 94  BUN 23* 32* 45*  CREATININE 3.56* 3.99* 3.99*  CALCIUM 7.6*  7.3* 7.5*  7.2* 8.0*  MG 2.2  --   --   PHOS 3.6 4.3 4.6    Liver Function Tests: Recent Labs  Lab 05/01/18 0506 05/01/18 2346 05/05/18 0654  AST 20  --   --   ALT <5  --   --   ALKPHOS 119  --   --   BILITOT 0.7  --   --   PROT 7.1  --   --   ALBUMIN 3.1* 2.8* 2.6*   No results for input(s): LIPASE, AMYLASE in the last 168 hours. No results for input(s): AMMONIA in the last 168  hours.  CBC: Recent Labs  Lab 05/01/18 0506  05/01/18 1845 05/01/18 2346 05/03/18 0622 05/04/18 0632 05/05/18 0654  WBC 16.6*  --   --  12.9*  --   --  13.6*  NEUTROABS 13.7*  --   --   --   --   --   --   HGB 8.4*   < > 7.0* 8.0* 7.3* 7.7* 7.4*  HCT 28.3*   < > 23.0* 25.7* 24.1* 25.4* 23.7*  MCV 88.7  --   --  86.8  --   --  85.3  PLT 327  --   --  289  --   --  335   < > = values in this interval not displayed.    Cardiac Enzymes: No results for input(s): CKTOTAL, CKMB, CKMBINDEX, TROPONINI in the last 168 hours.  BNP (last 3 results) No results for input(s): BNP in the last 8760 hours.  ProBNP (last 3 results) No results for input(s): PROBNP in the last 8760 hours.  Radiological Exams: No results found.  Assessment/Plan Principal Problem:   Acute on chronic respiratory failure with hypoxia [facility]) Active Problems:   Aspiration pneumonia (HCC)   Seizure disorder (HCC)   Cardiac arrest (  HCC)   End stage renal disease on dialysis (HCC)   Chronic systolic heart failure (HCC)   1. Acute on chronic respiratory failure with hypoxia we will continue with full support.  Continue pulmonary toilet secretion management.  Assess the spontaneous breathing index is already noted above patient has been failing his spontaneous breathing trials.  Patient is to have a trach 2. Pneumonia due to aspiration treated with antibiotics we will follow-up x-ray as necessary 3. Seizure disorder grossly unchanged 4. Status post cardiac arrest no arrhythmias are noted at baseline 5. End-stage renal disease followed by nephrology for dialysis 6. Chronic systolic heart failure at baseline continue present management   I have personally seen and evaluated the patient, evaluated laboratory and imaging results, formulated the assessment and plan and placed orders. The Patient requires high complexity decision making for assessment and support.  Case was discussed on Rounds with the Respiratory  Therapy Staff time 35 minutes patient is critically ill in danger of cardiac arrest.  As mentioned above will need a tracheostomy for ongoing airway support  Yevonne PaxSaadat A Tyreka Henneke, MD [facility] Pulmonary Critical Care Medicine Sleep Medicine

## 2018-05-06 DIAGNOSIS — I469 Cardiac arrest, cause unspecified: Secondary | ICD-10-CM | POA: Diagnosis not present

## 2018-05-06 DIAGNOSIS — J9621 Acute and chronic respiratory failure with hypoxia: Secondary | ICD-10-CM | POA: Diagnosis not present

## 2018-05-06 DIAGNOSIS — J690 Pneumonitis due to inhalation of food and vomit: Secondary | ICD-10-CM | POA: Diagnosis not present

## 2018-05-06 DIAGNOSIS — I5022 Chronic systolic (congestive) heart failure: Secondary | ICD-10-CM | POA: Diagnosis not present

## 2018-05-06 LAB — BASIC METABOLIC PANEL
ANION GAP: 15 (ref 5–15)
BUN: 38 mg/dL — ABNORMAL HIGH (ref 6–20)
CHLORIDE: 90 mmol/L — AB (ref 98–111)
CO2: 27 mmol/L (ref 22–32)
Calcium: 7.9 mg/dL — ABNORMAL LOW (ref 8.9–10.3)
Creatinine, Ser: 3.01 mg/dL — ABNORMAL HIGH (ref 0.44–1.00)
GFR calc Af Amer: 23 mL/min — ABNORMAL LOW (ref >60)
GFR calc non Af Amer: 20 mL/min — ABNORMAL LOW (ref >60)
GLUCOSE: 94 mg/dL (ref 70–99)
POTASSIUM: 3.6 mmol/L (ref 3.5–5.1)
Sodium: 132 mmol/L — ABNORMAL LOW (ref 135–145)

## 2018-05-06 LAB — PHOSPHORUS: PHOSPHORUS: 4 mg/dL (ref 2.5–4.6)

## 2018-05-06 LAB — MAGNESIUM: Magnesium: 1.9 mg/dL (ref 1.7–2.4)

## 2018-05-06 NOTE — Progress Notes (Addendum)
[facility] [place]   PULMONARY SERVICE  PROGRESS NOTE  Date of Service: 05/06/2018  Lori NobleMegan C Gutierrez  ZOX:[id number]:[id number]  DOB: 1981-07-01   DOA: 04/30/2018  Referring Physician: Carron CurieAli Hijazi, MD  HPI: Lori Gutierrez is a 37 y.o. female seen for follow up of Acute on Chronic Respiratory Failure.  She remains on full support has to be sedated because she was increasingly agitated.  Patient is on propofol drip which is being titrated.  She remains critically ill with the endotracheal tube in place she is a high risk airway at this time.  Medications: Reviewed on Rounds  Physical Exam:  Vitals: Temperature 97.3 pulse 105 respiratory rate 12 blood pressure 173/84 saturation 94%  Ventilator Settings mode of ventilation assist control FiO2 30% tidal volume 425 PEEP 5  . General: Comfortable at this time . Eyes: Grossly normal lids, irises & conjunctiva . ENT: grossly tongue is normal . Neck: no obvious mass . Cardiovascular: S1 S2 normal no gallop . Respiratory: No rhonchi rales are noted at this time . Abdomen: soft . Skin: no rash seen on limited exam . Musculoskeletal: not rigid . Psychiatric:unable to assess . Neurologic: no seizure no involuntary movements         Lab Data:   Basic Metabolic Panel: Recent Labs  Lab 05/01/18 0506 05/01/18 2346 05/05/18 0654 05/06/18 0540  NA 136 132* 129* 132*  K 3.9 4.1 5.7* 3.6  CL 98 93* 89* 90*  CO2 25 24 23 27   GLUCOSE 107* 100* 94 94  BUN 23* 32* 45* 38*  CREATININE 3.56* 3.99* 3.99* 3.01*  CALCIUM 7.6*  7.3* 7.5*  7.2* 8.0* 7.9*  MG 2.2  --   --  1.9  PHOS 3.6 4.3 4.6 4.0    Liver Function Tests: Recent Labs  Lab 05/01/18 0506 05/01/18 2346 05/05/18 0654  AST 20  --   --   ALT <5  --   --   ALKPHOS 119  --   --   BILITOT 0.7  --   --   PROT 7.1  --   --   ALBUMIN 3.1* 2.8* 2.6*   No results for input(s): LIPASE, AMYLASE in the last 168 hours. No results for input(s):  AMMONIA in the last 168 hours.  CBC: Recent Labs  Lab 05/01/18 0506  05/01/18 1845 05/01/18 2346 05/03/18 0622 05/04/18 0632 05/05/18 0654  WBC 16.6*  --   --  12.9*  --   --  13.6*  NEUTROABS 13.7*  --   --   --   --   --   --   HGB 8.4*   < > 7.0* 8.0* 7.3* 7.7* 7.4*  HCT 28.3*   < > 23.0* 25.7* 24.1* 25.4* 23.7*  MCV 88.7  --   --  86.8  --   --  85.3  PLT 327  --   --  289  --   --  335   < > = values in this interval not displayed.    Cardiac Enzymes: No results for input(s): CKTOTAL, CKMB, CKMBINDEX, TROPONINI in the last 168 hours.  BNP (last 3 results) No results for input(s): BNP in the last 8760 hours.  ProBNP (last 3 results) No results for input(s): PROBNP in the last 8760 hours.  Radiological Exams: No results found.  Assessment/Plan Principal Problem:   Acute on chronic respiratory failure with hypoxia (HCC) Active Problems:   Aspiration pneumonia (HCC)   Seizure disorder (  HCC)   Cardiac arrest (HCC)   End stage renal disease on dialysis (HCC)   Chronic systolic heart failure (HCC)   1. Acute on chronic respiratory failure with hypoxia we will continue with full vent support as Artie mentioned patient is on assist control mode right now requiring 30% oxygen.  Patient also requiring sedation because she is a danger of pulling out the ET tube.  We are waiting for tracheostomy apparently has been seen by ENT. 2. Pneumonia due to aspiration treated with antibiotics we will continue to follow 3. Seizure disorder no active seizures are noted 4. Cardiac arrest rhythm has been stable 5. End-stage renal disease currently is on dialysis will follow 6. Chronic systolic heart failure baseline monitor fluid status   I have personally seen and evaluated the patient, evaluated laboratory and imaging results, formulated the assessment and plan and placed orders. The Patient requires high complexity decision making for assessment and support.  Case was discussed on  Rounds with the Respiratory Therapy Staff time spent 35 minutes patient is critically ill in danger of cardiac arrest.  Case discussed on rounds with respiratory therapy case management as well as the primary care team  Yevonne Pax, MD [facility] Pulmonary Critical Care Medicine Sleep Medicine

## 2018-05-06 NOTE — Progress Notes (Signed)
[facility]  ROUNDING NOTE   Subjective:  Critical illness persist at this time. Continues to have labile blood pressure. Patient remains on the ventilator with FiO2 of 30%.   Objective:  Vital signs in last 24 hours:  Temperature 97.3 pulse 105 respirations 18 blood pressure 173/104  Physical Exam: General: Critically ill appearing  Head: [place]/AT ETT/NG in place  Eyes: Anicteric  Neck: Supple, trachea midline  Lungs:  Scattered rhonchi, vent assisted  Heart: S1S2 no rubs  Abdomen:  Soft, nontender, bowel sounds present  Extremities: trace peripheral edema.  Neurologic: Sleeping, not following commands  Skin: No lesions  Access: L IJ permcath    Basic Metabolic Panel: Recent Labs  Lab 05/01/18 0506 05/01/18 2346 05/05/18 0654 05/06/18 0540  NA 136 132* 129* 132*  K 3.9 4.1 5.7* 3.6  CL 98 93* 89* 90*  CO2 25 24 23 27   GLUCOSE 107* 100* 94 94  BUN 23* 32* 45* 38*  CREATININE 3.56* 3.99* 3.99* 3.01*  CALCIUM 7.6*  7.3* 7.5*  7.2* 8.0* 7.9*  MG 2.2  --   --  1.9  PHOS 3.6 4.3 4.6 4.0    Liver Function Tests: Recent Labs  Lab 05/01/18 0506 05/01/18 2346 05/05/18 0654  AST 20  --   --   ALT <5  --   --   ALKPHOS 119  --   --   BILITOT 0.7  --   --   PROT 7.1  --   --   ALBUMIN 3.1* 2.8* 2.6*   No results for input(s): LIPASE, AMYLASE in the last 168 hours. No results for input(s): AMMONIA in the last 168 hours.  CBC: Recent Labs  Lab 05/01/18 0506  05/01/18 1845 05/01/18 2346 05/03/18 0622 05/04/18 0632 05/05/18 0654  WBC 16.6*  --   --  12.9*  --   --  13.6*  NEUTROABS 13.7*  --   --   --   --   --   --   HGB 8.4*   < > 7.0* 8.0* 7.3* 7.7* 7.4*  HCT 28.3*   < > 23.0* 25.7* 24.1* 25.4* 23.7*  MCV 88.7  --   --  86.8  --   --  85.3  PLT 327  --   --  289  --   --  335   < > = values in this interval not displayed.    Cardiac Enzymes: No results for input(s): CKTOTAL, CKMB, CKMBINDEX, TROPONINI in the last 168  hours.  BNP: Invalid input(s): POCBNP  CBG: No results for input(s): GLUCAP in the last 168 hours.  Microbiology: Results for orders placed or performed during the hospital encounter of 04/30/18  Culture, respiratory (non-expectorated)     Status: None   Collection Time: 05/01/18  7:52 AM  Result Value Ref Range Status   Specimen Description TRACHEAL ASPIRATE  Final   Special Requests NONE  Final   Gram Stain   Final    ABUNDANT WBC PRESENT,BOTH PMN AND MONONUCLEAR NO ORGANISMS SEEN    Culture   Final    RARE Consistent with normal respiratory flora. Performed [facility], [address] [place], [place] [address]    Report Status 05/03/2018 FINAL  Final    Coagulation Studies: No results for input(s): LABPROT, INR in the last 72 hours.  Urinalysis: No results for input(s): COLORURINE, LABSPEC, PHURINE, GLUCOSEU, HGBUR, BILIRUBINUR, KETONESUR, PROTEINUR, UROBILINOGEN, NITRITE, LEUKOCYTESUR in the last 72 hours.  Invalid input(s):  APPERANCEUR    Imaging: No results found.   Medications:       Assessment/ Plan:  37 y.o. female with a PMHx of recent acute respiratory failure requiring intubation, aspiration pneumonia, cardiac arrest, chronic systolic heart failure, delay of cognitive development, end-stage renal disease on hemodialysis MWF, hypertension, seizure disorder, who was admitted to [facility] on 04/30/2018 for ongoing management of acute respiratory failure, recent PEA arrest, and end-stage renal disease.   1.  ESRD on HD.    We will plan for dialysis again tomorrow.  Orders to be prepared.  2.  Acute respiratory failure.  Secondary to aspiration.   -Remains on the ventilator with FiO2 of 30%.  She would likely end up requiring tracheostomy placement.  3.  Anemia of chronic kidney disease.    Hemoglobin currently 7.4.  Continues retacrit 40981 units IV with HD.   4.  Secondary hyperparathyroidism.  Phos 4.0, continue renvela powder.    5.  Hypertension:  Blood pressure still labile, will increase hydralazine to 100mg  po qid.     LOS: 0 Lori Gutierrez 8/7/201911:19 AM

## 2018-05-07 DIAGNOSIS — J690 Pneumonitis due to inhalation of food and vomit: Secondary | ICD-10-CM | POA: Diagnosis not present

## 2018-05-07 DIAGNOSIS — I5022 Chronic systolic (congestive) heart failure: Secondary | ICD-10-CM | POA: Diagnosis not present

## 2018-05-07 DIAGNOSIS — I469 Cardiac arrest, cause unspecified: Secondary | ICD-10-CM | POA: Diagnosis not present

## 2018-05-07 DIAGNOSIS — J9621 Acute and chronic respiratory failure with hypoxia: Secondary | ICD-10-CM | POA: Diagnosis not present

## 2018-05-07 LAB — CBC
HEMATOCRIT: 23.3 % — AB (ref 36.0–46.0)
HEMOGLOBIN: 7.3 g/dL — AB (ref 12.0–15.0)
MCH: 27 pg (ref 26.0–34.0)
MCHC: 31.3 g/dL (ref 30.0–36.0)
MCV: 86.3 fL (ref 78.0–100.0)
Platelets: 325 10*3/uL (ref 150–400)
RBC: 2.7 MIL/uL — ABNORMAL LOW (ref 3.87–5.11)
RDW: 19.1 % — ABNORMAL HIGH (ref 11.5–15.5)
WBC: 13.6 10*3/uL — ABNORMAL HIGH (ref 4.0–10.5)

## 2018-05-07 LAB — RENAL FUNCTION PANEL
ALBUMIN: 2.5 g/dL — AB (ref 3.5–5.0)
ANION GAP: 20 — AB (ref 5–15)
BUN: 52 mg/dL — ABNORMAL HIGH (ref 6–20)
CO2: 25 mmol/L (ref 22–32)
Calcium: 8.2 mg/dL — ABNORMAL LOW (ref 8.9–10.3)
Chloride: 86 mmol/L — ABNORMAL LOW (ref 98–111)
Creatinine, Ser: 3.53 mg/dL — ABNORMAL HIGH (ref 0.44–1.00)
GFR calc Af Amer: 18 mL/min — ABNORMAL LOW (ref >60)
GFR, EST NON AFRICAN AMERICAN: 16 mL/min — AB (ref >60)
GLUCOSE: 82 mg/dL (ref 70–99)
PHOSPHORUS: 4.1 mg/dL (ref 2.5–4.6)
POTASSIUM: 3.7 mmol/L (ref 3.5–5.1)
Sodium: 131 mmol/L — ABNORMAL LOW (ref 135–145)

## 2018-05-07 LAB — PREPARE RBC (CROSSMATCH)

## 2018-05-07 NOTE — Anesthesia Preprocedure Evaluation (Addendum)
Anesthesia Evaluation  Patient identified by MRN, date of birth, ID band Patient awake    Reviewed: Allergy & Precautions, H&P , NPO status , Patient's Chart, lab work & pertinent test results, reviewed documented beta blocker date and time   Airway Mallampati: II  TM Distance: >3 FB Neck ROM: full    Dental no notable dental hx.    Pulmonary pneumonia,    Pulmonary exam normal breath sounds clear to auscultation       Cardiovascular Exercise Tolerance: Good hypertension,  Rhythm:regular Rate:Normal     Neuro/Psych Seizures -,  negative psych ROS   GI/Hepatic negative GI ROS, Neg liver ROS,   Endo/Other  negative endocrine ROS  Renal/GU CRFRenal disease  negative genitourinary   Musculoskeletal negative musculoskeletal ROS (+)   Abdominal   Peds  Hematology negative hematology ROS (+)   Anesthesia Other Findings 1. Acute on chronic respiratory failure with hypoxia patient is doing fine with the full vent support on assist control mode right now.  Patient has been on 28% FiO2 tidal volume 430 PEEP 5.  As noted patient is scheduled for tracheostomy in the morning by ENT. 2. Pneumonia due to aspiration continue with present management supportive care 3. Seizure disorder at baseline we will continue to monitor 4. Cardiac arrest rhythm has been stable 5. End-stage renal disease on dialysis continue with hemodialysis as per nephrology recommendations. 6. Chronic systolic heart failure right now is compensated we will continue to monitor  Reproductive/Obstetrics negative OB ROS                           Anesthesia Physical Anesthesia Plan  ASA: IV  Anesthesia Plan: General   Post-op Pain Management:    Induction:   PONV Risk Score and Plan: 3 and Ondansetron and Treatment may vary due to age or medical condition  Airway Management Planned: Tracheostomy  Additional Equipment:   Intra-op  Plan:   Post-operative Plan: Post-operative intubation/ventilation  Informed Consent: I have reviewed the patients History and Physical, chart, labs and discussed the procedure including the risks, benefits and alternatives for the proposed anesthesia with the patient or authorized representative who has indicated his/her understanding and acceptance.   Dental Advisory Given  Plan Discussed with: CRNA  Anesthesia Plan Comments:        Anesthesia Quick Evaluation

## 2018-05-07 NOTE — Progress Notes (Addendum)
Pulmonary Critical Care Medicine [facility]   PULMONARY SERVICE  PROGRESS NOTE  Date of Service: 05/07/2018  Lori NobleMegan C Agosto  WJX:[id number]  DOB: [birth year]   DOA: 04/30/2018  Referring Physician: Carron CurieAli Hijazi, MD  HPI: Lori Gutierrez is a 37 y.o. female seen for follow up of Acute on Chronic Respiratory Failure.  Patient is on full vent support orally intubated.  She still is on dipper Zenaida NieceVan has had some breakthrough with movements noted.  Patient's tracheostomy is scheduled for the morning.  Her airway is still high risk with danger of cardiac arrest  Medications: Reviewed on Rounds  Physical Exam:  Vitals: Temperature 97.4 pulse 92 respiratory 18 blood pressure 162/97 saturations 100%  Ventilator Settings mode of ventilation is assist control FiO2 28% tidal volume 430 PEEP 5  . General: Comfortable at this time . Eyes: Grossly normal lids, irises & conjunctiva . ENT: grossly tongue is normal . Neck: no obvious mass . Cardiovascular: S1 S2 normal no gallop . Respiratory: Good air entry no rhonchi or rales are noted . Abdomen: soft . Skin: no rash seen on limited exam . Musculoskeletal: not rigid . Psychiatric:unable to assess . Neurologic: no seizure no involuntary movements         Lab Data:   Basic Metabolic Panel: Recent Labs  Lab 05/01/18 0506 05/01/18 2346 05/05/18 0654 05/06/18 0540 05/07/18 0457  NA 136 132* 129* 132* 131*  K 3.9 4.1 5.7* 3.6 3.7  CL 98 93* 89* 90* 86*  CO2 25 24 23 27 25   GLUCOSE 107* 100* 94 94 82  BUN 23* 32* 45* 38* 52*  CREATININE 3.56* 3.99* 3.99* 3.01* 3.53*  CALCIUM 7.6*  7.3* 7.5*  7.2* 8.0* 7.9* 8.2*  MG 2.2  --   --  1.9  --   PHOS 3.6 4.3 4.6 4.0 4.1    Liver Function Tests: Recent Labs  Lab 05/01/18 0506 05/01/18 2346 05/05/18 0654 05/07/18 0457  AST 20  --   --   --   ALT <5  --   --   --   ALKPHOS 119  --   --   --   BILITOT 0.7  --   --   --   PROT 7.1  --   --   --   ALBUMIN 3.1* 2.8* 2.6*  2.5*   No results for input(s): LIPASE, AMYLASE in the last 168 hours. No results for input(s): AMMONIA in the last 168 hours.  CBC: Recent Labs  Lab 05/01/18 0506  05/01/18 2346 05/03/18 0622 05/04/18 [id number] 05/05/18 0654 05/07/18 0457  WBC 16.6*  --  12.9*  --   --  13.6* 13.6*  NEUTROABS 13.7*  --   --   --   --   --   --   HGB 8.4*   < > 8.0* 7.3* 7.7* 7.4* 7.3*  HCT 28.3*   < > 25.7* 24.1* 25.4* 23.7* 23.3*  MCV 88.7  --  86.8  --   --  85.3 86.3  PLT 327  --  289  --   --  335 325   < > = values in this interval not displayed.    Cardiac Enzymes: No results for input(s): CKTOTAL, CKMB, CKMBINDEX, TROPONINI in the last 168 hours.  BNP (last 3 results) No results for input(s): BNP in the last 8760 hours.  ProBNP (last 3 results) No results for input(s): PROBNP in the last 8760 hours.  Radiological Exams: No results found.  Assessment/Plan Principal Problem:   Acute on chronic respiratory failure with hypoxia (HCC) Active Problems:   Aspiration pneumonia (HCC)   Seizure disorder (HCC)   Cardiac arrest (HCC)   End stage renal disease on dialysis (HCC)   Chronic systolic heart failure (HCC)   1. Acute on chronic respiratory failure with hypoxia patient is doing fine with the full vent support on assist control mode right now.  Patient has been on 28% FiO2 tidal volume 430 PEEP 5.  As noted patient is scheduled for tracheostomy in the morning by ENT. 2. Pneumonia due to aspiration continue with present management supportive care 3. Seizure disorder at baseline we will continue to monitor 4. Cardiac arrest rhythm has been stable 5. End-stage renal disease on dialysis continue with hemodialysis as per nephrology recommendations. 6. Chronic systolic heart failure right now is compensated we will continue to monitor   I have personally seen and evaluated the patient, evaluated laboratory and imaging results, formulated the assessment and plan and placed orders. The  Patient requires high complexity decision making for assessment and support.  Case was discussed on Rounds with the Respiratory Therapy Staff time spent 35 minutes patient has high risk airway assessment.  Discussed with primary care team as well as the respiratory therapist on rounds  Yevonne Pax, MD [facility] Pulmonary Critical Care Medicine Sleep Medicine

## 2018-05-08 ENCOUNTER — Institutional Professional Consult (permissible substitution) (HOSPITAL_COMMUNITY): Payer: Self-pay | Admitting: Anesthesiology

## 2018-05-08 ENCOUNTER — Inpatient Hospital Stay: Admission: RE | Admit: 2018-05-08 | Payer: Self-pay | Source: Ambulatory Visit

## 2018-05-08 ENCOUNTER — Encounter (HOSPITAL_COMMUNITY)
Admission: AD | Disposition: A | Discharge status: Skilled Nursing Facility | Payer: Self-pay | Source: Ambulatory Visit | Attending: Internal Medicine

## 2018-05-08 ENCOUNTER — Encounter: Payer: Self-pay | Admitting: Anesthesiology

## 2018-05-08 DIAGNOSIS — J9621 Acute and chronic respiratory failure with hypoxia: Secondary | ICD-10-CM | POA: Diagnosis not present

## 2018-05-08 DIAGNOSIS — J690 Pneumonitis due to inhalation of food and vomit: Secondary | ICD-10-CM | POA: Diagnosis not present

## 2018-05-08 DIAGNOSIS — I469 Cardiac arrest, cause unspecified: Secondary | ICD-10-CM | POA: Diagnosis not present

## 2018-05-08 DIAGNOSIS — I5022 Chronic systolic (congestive) heart failure: Secondary | ICD-10-CM | POA: Diagnosis not present

## 2018-05-08 HISTORY — PX: TRACHEOSTOMY TUBE PLACEMENT: SHX814

## 2018-05-08 LAB — TYPE AND SCREEN
ABO/RH(D): A POS
ANTIBODY SCREEN: NEGATIVE
Unit division: 0

## 2018-05-08 LAB — BPAM RBC
BLOOD PRODUCT EXPIRATION DATE: 201908312359
ISSUE DATE / TIME: 201908081551
UNIT TYPE AND RH: 6200

## 2018-05-08 LAB — CBC
HEMATOCRIT: 25.4 % — AB (ref 36.0–46.0)
HEMOGLOBIN: 8.3 g/dL — AB (ref 12.0–15.0)
MCH: 28.3 pg (ref 26.0–34.0)
MCHC: 32.7 g/dL (ref 30.0–36.0)
MCV: 86.7 fL (ref 78.0–100.0)
Platelets: 297 10*3/uL (ref 150–400)
RBC: 2.93 MIL/uL — AB (ref 3.87–5.11)
RDW: 18.5 % — ABNORMAL HIGH (ref 11.5–15.5)
WBC: 11.2 10*3/uL — AB (ref 4.0–10.5)

## 2018-05-08 SURGERY — CREATION, TRACHEOSTOMY
Anesthesia: General | Site: Neck

## 2018-05-08 MED ORDER — LIDOCAINE-EPINEPHRINE 1 %-1:100000 IJ SOLN
INTRAMUSCULAR | Status: DC | PRN
  Administered 2018-05-08: 5 mL

## 2018-05-08 MED ORDER — ROCURONIUM BROMIDE 10 MG/ML (PF) SYRINGE
PREFILLED_SYRINGE | INTRAVENOUS | Status: AC
  Filled 2018-05-08: qty 10

## 2018-05-08 MED ORDER — 0.9 % SODIUM CHLORIDE (POUR BTL) OPTIME
TOPICAL | Status: DC | PRN
  Administered 2018-05-08: 1000 mL

## 2018-05-08 MED ORDER — STERILE WATER FOR IRRIGATION IR SOLN
Status: DC | PRN
  Administered 2018-05-08: 1000 mL

## 2018-05-08 MED ORDER — SODIUM CHLORIDE 0.9 % IV SOLN
INTRAVENOUS | Status: DC | PRN
  Administered 2018-05-08: 08:00:00 via INTRAVENOUS

## 2018-05-08 MED ORDER — DEXAMETHASONE SODIUM PHOSPHATE 10 MG/ML IJ SOLN
INTRAMUSCULAR | Status: DC | PRN
  Administered 2018-05-08: 10 mg via INTRAVENOUS

## 2018-05-08 MED ORDER — ONDANSETRON HCL 4 MG/2ML IJ SOLN
INTRAMUSCULAR | Status: DC | PRN
  Administered 2018-05-08: 4 mg via INTRAVENOUS

## 2018-05-08 MED ORDER — MIDAZOLAM HCL 5 MG/5ML IJ SOLN
INTRAMUSCULAR | Status: DC | PRN
  Administered 2018-05-08: 2 mg via INTRAVENOUS

## 2018-05-08 MED ORDER — ROCURONIUM BROMIDE 100 MG/10ML IV SOLN
INTRAVENOUS | Status: DC | PRN
  Administered 2018-05-08: 40 mg via INTRAVENOUS

## 2018-05-08 MED ORDER — PROPOFOL 10 MG/ML IV BOLUS
INTRAVENOUS | Status: AC
  Filled 2018-05-08: qty 20

## 2018-05-08 MED ORDER — LIDOCAINE 2% (20 MG/ML) 5 ML SYRINGE
INTRAMUSCULAR | Status: AC
  Filled 2018-05-08: qty 5

## 2018-05-08 MED ORDER — FENTANYL CITRATE (PF) 250 MCG/5ML IJ SOLN
INTRAMUSCULAR | Status: AC
  Filled 2018-05-08: qty 5

## 2018-05-08 MED ORDER — MIDAZOLAM HCL 2 MG/2ML IJ SOLN
INTRAMUSCULAR | Status: AC
  Filled 2018-05-08: qty 2

## 2018-05-08 MED ORDER — FENTANYL CITRATE (PF) 100 MCG/2ML IJ SOLN
INTRAMUSCULAR | Status: DC | PRN
  Administered 2018-05-08 (×2): 50 ug via INTRAVENOUS

## 2018-05-08 SURGICAL SUPPLY — 35 items
ATTRACTOMAT 16X20 MAGNETIC DRP (DRAPES) IMPLANT
BLADE SURG 15 STRL LF DISP TIS (BLADE) ×1 IMPLANT
BLADE SURG 15 STRL SS (BLADE) ×2
CLEANER TIP ELECTROSURG 2X2 (MISCELLANEOUS) ×3 IMPLANT
COVER SURGICAL LIGHT HANDLE (MISCELLANEOUS) ×3 IMPLANT
DRAPE HALF SHEET 40X57 (DRAPES) IMPLANT
ELECT COATED BLADE 2.86 ST (ELECTRODE) ×3 IMPLANT
ELECT REM PT RETURN 9FT ADLT (ELECTROSURGICAL) ×3
ELECTRODE REM PT RTRN 9FT ADLT (ELECTROSURGICAL) ×1 IMPLANT
GAUZE 4X4 16PLY RFD (DISPOSABLE) ×3 IMPLANT
GEL ULTRASOUND 20GR AQUASONIC (MISCELLANEOUS) ×3 IMPLANT
GLOVE SS BIOGEL STRL SZ 7.5 (GLOVE) ×1 IMPLANT
GLOVE SUPERSENSE BIOGEL SZ 7.5 (GLOVE) ×2
GOWN STRL REUS W/ TWL LRG LVL3 (GOWN DISPOSABLE) ×1 IMPLANT
GOWN STRL REUS W/ TWL XL LVL3 (GOWN DISPOSABLE) ×1 IMPLANT
GOWN STRL REUS W/TWL LRG LVL3 (GOWN DISPOSABLE) ×2
GOWN STRL REUS W/TWL XL LVL3 (GOWN DISPOSABLE) ×2
HOLDER TRACH TUBE VELCRO 19.5 (MISCELLANEOUS) ×3 IMPLANT
KIT BASIN OR (CUSTOM PROCEDURE TRAY) ×3 IMPLANT
KIT TURNOVER KIT B (KITS) ×3 IMPLANT
NEEDLE HYPO 25GX1X1/2 BEV (NEEDLE) ×3 IMPLANT
NS IRRIG 1000ML POUR BTL (IV SOLUTION) ×3 IMPLANT
PACK EENT II TURBAN DRAPE (CUSTOM PROCEDURE TRAY) ×3 IMPLANT
PAD ARMBOARD 7.5X6 YLW CONV (MISCELLANEOUS) ×3 IMPLANT
PENCIL BUTTON HOLSTER BLD 10FT (ELECTRODE) ×3 IMPLANT
SPONGE DRAIN TRACH 4X4 STRL 2S (GAUZE/BANDAGES/DRESSINGS) ×3 IMPLANT
SPONGE INTESTINAL PEANUT (DISPOSABLE) ×3 IMPLANT
SUT SILK 2 0 SH CR/8 (SUTURE) ×3 IMPLANT
SUT SILK 3 0 TIES 10X30 (SUTURE) IMPLANT
SYR 5ML LUER SLIP (SYRINGE) ×3 IMPLANT
SYR CONTROL 10ML LL (SYRINGE) ×3 IMPLANT
TOWEL OR 17X26 10 PK STRL BLUE (TOWEL DISPOSABLE) ×3 IMPLANT
TUBE CONNECTING 12'X1/4 (SUCTIONS) ×1
TUBE CONNECTING 12X1/4 (SUCTIONS) ×2 IMPLANT
TUBE TRACH SHILEY  6 DIST  CUF (TUBING) ×3 IMPLANT

## 2018-05-08 NOTE — Progress Notes (Signed)
Pulmonary Critical Care Medicine [facility]   PULMONARY SERVICE  PROGRESS NOTE  Date of Service: 05/08/2018  Lori NobleMegan C Gutierrez  ZOX:[id number]  DOB: 07-Nov-1980   DOA: 04/30/2018  Referring Physician: Carron CurieAli Hijazi, MD  HPI: Lori Gutierrez is a 37 y.o. female seen for follow up of Acute on Chronic Respiratory Failure.  Patient had tracheostomy done.  Right now afebrile comfortable without distress.  Patient is on 30% FiO2  Medications: Reviewed on Rounds  Physical Exam:  Vitals: Temperature 97.3 pulse 88 respiratory rate 17 blood pressure is 155/88 saturations 97%  Ventilator Settings mode of ventilation assist control FiO2 30% tidal volume 316 PEEP is 5  . General: Comfortable at this time . Eyes: Grossly normal lids, irises & conjunctiva . ENT: grossly tongue is normal . Neck: no obvious mass . Cardiovascular: S1 S2 normal no gallop . Respiratory: No rhonchi or rales are noted . Abdomen: soft . Skin: no rash seen on limited exam . Musculoskeletal: not rigid . Psychiatric:unable to assess . Neurologic: no seizure no involuntary movements         Lab Data:   Basic Metabolic Panel: Recent Labs  Lab 05/01/18 2346 05/05/18 0654 05/06/18 0540 05/07/18 0457  NA 132* 129* 132* 131*  K 4.1 5.7* 3.6 3.7  CL 93* 89* 90* 86*  CO2 24 23 27 25   GLUCOSE 100* 94 94 82  BUN 32* 45* 38* 52*  CREATININE 3.99* 3.99* 3.01* 3.53*  CALCIUM 7.5*  7.2* 8.0* 7.9* 8.2*  MG  --   --  1.9  --   PHOS 4.3 4.6 4.0 4.1    Liver Function Tests: Recent Labs  Lab 05/01/18 2346 05/05/18 0654 05/07/18 0457  ALBUMIN 2.8* 2.6* 2.5*   No results for input(s): LIPASE, AMYLASE in the last 168 hours. No results for input(s): AMMONIA in the last 168 hours.  CBC: Recent Labs  Lab 05/01/18 2346 05/03/18 0622 05/04/18 [id number] 05/05/18 0654 05/07/18 0457 05/08/18 0644  WBC 12.9*  --   --  13.6* 13.6* 11.2*  HGB 8.0* 7.3* 7.7* 7.4* 7.3* 8.3*  HCT 25.7* 24.1* 25.4* 23.7* 23.3*  25.4*  MCV 86.8  --   --  85.3 86.3 86.7  PLT 289  --   --  335 325 297    Cardiac Enzymes: No results for input(s): CKTOTAL, CKMB, CKMBINDEX, TROPONINI in the last 168 hours.  BNP (last 3 results) No results for input(s): BNP in the last 8760 hours.  ProBNP (last 3 results) No results for input(s): PROBNP in the last 8760 hours.  Radiological Exams: No results found.  Assessment/Plan Principal Problem:   Acute on chronic respiratory failure with hypoxia (HCC) Active Problems:   Aspiration pneumonia (HCC)   Seizure disorder (HCC)   Cardiac arrest (HCC)   End stage renal disease on dialysis (HCC)   Chronic systolic heart failure (HCC)   1. Acute on chronic respiratory failure with hypoxia we will continue with full vent support for now.  Let her rest and then tomorrow we should be able to try to resume weaning.  We will continue secretion management supportive care. 2. Aspiration pneumonia treated with antibiotics we will continue to follow 3. Seizure disorder at baseline we will continue present management. 4. Status post cardiac arrest at baseline rhythm normal 5. End-stage renal disease on dialysis followed by nephrology. 6. Chronic systolic heart failure follow fluid status   I have personally seen and evaluated the patient, evaluated laboratory and imaging results, formulated  the assessment and plan and placed orders. The Patient requires high complexity decision making for assessment and support.  Case was discussed on Rounds with the Respiratory Therapy Staff  Allyne Gee, MD [facility] Pulmonary Critical Care Medicine Sleep Medicine

## 2018-05-08 NOTE — Transfer of Care (Signed)
Immediate Anesthesia Transfer of Care Note  Patient: Lori Gutierrez  Procedure(s) Performed: TRACHEOSTOMY (N/A Neck)  Patient Location: PACU  Anesthesia Type:General  Level of Consciousness: sedated and Patient remains intubated per anesthesia plan  Airway & Oxygen Therapy: Patient remains intubated per anesthesia plan and Patient placed on Ventilator (see vital sign flow sheet for setting)  Post-op Assessment: Report given to RN and Post -op Vital signs reviewed and stable  Post vital signs: Reviewed and stable  Last Vitals:  Vitals Value Taken Time  BP    Temp    Pulse    Resp    SpO2      Last Pain: There were no vitals filed for this visit.       Complications: No apparent anesthesia complications

## 2018-05-08 NOTE — Interval H&P Note (Signed)
History and Physical Interval Note:  05/08/2018 7:40 AM  Lori Gutierrez  has presented today for surgery, with the diagnosis of respiratory failure  The various methods of treatment have been discussed with the patient and family. After consideration of risks, benefits and other options for treatment, the patient has consented to  Procedure(s): TRACHEOSTOMY (N/A) as a surgical intervention .  The patient's history has been reviewed, patient examined, no change in status, stable for surgery.  I have reviewed the patient's chart and labs.  Questions were answered to the patient's satisfaction.     Dillard Cannonhristopher Newman

## 2018-05-08 NOTE — Progress Notes (Signed)
[facility]  ROUNDING NOTE   Subjective:  Patient just had tracheostomy placed. She will be due for dialysis again tomorrow. To be transitioned to MWF schedule starting next week.   Objective:  Vital signs in last 24 hours:  Temperature 97.3 pulse 88 respirations 22 blood pressure 155/110  Physical Exam: General: Critically ill appearing  Head: Scott/AT ETT/NG in place  Eyes: Anicteric  Neck:  Tracheostomy in place  Lungs:  Scattered rhonchi, vent assisted  Heart: S1S2 no rubs  Abdomen:  Soft, nontender, bowel sounds present  Extremities: trace peripheral edema.  Neurologic: Received sedation for tracheostomy  Skin: No lesions  Access: L IJ permcath    Basic Metabolic Panel: Recent Labs  Lab 05/01/18 2346 05/05/18 0654 05/06/18 0540 05/07/18 0457  NA 132* 129* 132* 131*  K 4.1 5.7* 3.6 3.7  CL 93* 89* 90* 86*  CO2 24 23 27 25   GLUCOSE 100* 94 94 82  BUN 32* 45* 38* 52*  CREATININE 3.99* 3.99* 3.01* 3.53*  CALCIUM 7.5*  7.2* 8.0* 7.9* 8.2*  MG  --   --  1.9  --   PHOS 4.3 4.6 4.0 4.1    Liver Function Tests: Recent Labs  Lab 05/01/18 2346 05/05/18 0654 05/07/18 0457  ALBUMIN 2.8* 2.6* 2.5*   No results for input(s): LIPASE, AMYLASE in the last 168 hours. No results for input(s): AMMONIA in the last 168 hours.  CBC: Recent Labs  Lab 05/01/18 2346 05/03/18 0622 05/04/18 1610 05/05/18 0654 05/07/18 0457 05/08/18 0644  WBC 12.9*  --   --  13.6* 13.6* 11.2*  HGB 8.0* 7.3* 7.7* 7.4* 7.3* 8.3*  HCT 25.7* 24.1* 25.4* 23.7* 23.3* 25.4*  MCV 86.8  --   --  85.3 86.3 86.7  PLT 289  --   --  335 325 297    Cardiac Enzymes: No results for input(s): CKTOTAL, CKMB, CKMBINDEX, TROPONINI in the last 168 hours.  BNP: Invalid input(s): POCBNP  CBG: No results for input(s): GLUCAP in the last 168 hours.  Microbiology: Results for orders placed or performed during the hospital encounter of 04/30/18  Culture, respiratory (non-expectorated)      Status: None   Collection Time: 05/01/18  7:52 AM  Result Value Ref Range Status   Specimen Description TRACHEAL ASPIRATE  Final   Special Requests NONE  Final   Gram Stain   Final    ABUNDANT WBC PRESENT,BOTH PMN AND MONONUCLEAR NO ORGANISMS SEEN    Culture   Final    RARE Consistent with normal respiratory flora. Performed at [facility], [address], [place], [place] [address]    Report Status 05/03/2018 FINAL  Final    Coagulation Studies: No results for input(s): LABPROT, INR in the last 72 hours.  Urinalysis: No results for input(s): COLORURINE, LABSPEC, PHURINE, GLUCOSEU, HGBUR, BILIRUBINUR, KETONESUR, PROTEINUR, UROBILINOGEN, NITRITE, LEUKOCYTESUR in the last 72 hours.  Invalid input(s): APPERANCEUR    Imaging: No results found.   Medications:       Assessment/ Plan:  37 y.o. female with a PMHx of recent acute respiratory failure requiring intubation, aspiration pneumonia, cardiac arrest, chronic systolic heart failure, delay of cognitive development, end-stage renal disease on hemodialysis MWF, hypertension, seizure disorder, who was admitted to [facility] on 04/30/2018 for ongoing management of acute respiratory failure, recent PEA arrest, and end-stage renal disease.   1.  ESRD on HD.    Patient due for dialysis again on Saturday.  Thereafter we will switch her  to MWF scheduling.  2.  Acute respiratory failure.  Secondary to aspiration.   -Patient status post tracheostomy placement.  Hopefully she can now be weaned off of the ventilator.  3.  Anemia of chronic kidney disease.    Hemoglobin up to 8.3.  Maintain the patient on erythropoietin stimulating agents.  4.  Secondary hyperparathyroidism.  Stress acceptable at 4.1.  Patient to be maintained on Renvela powder.  5.  Hypertension: Blood pressure continues to be labile at times.  She is on IV hydralazine for acute blood pressure elevations.  P.o. hydralazine was also recently increased.   Continue to monitor for now.    LOS: 0 Lori Gutierrez 8/9/20198:29 AM

## 2018-05-08 NOTE — OR Nursing (Signed)
CALLED 5E RN TO LET KNOW WE WILL BE ARRIVING WITH PATIENT IN 5 MINUTES.

## 2018-05-08 NOTE — Brief Op Note (Signed)
05/08/2018  8:30 AM  PATIENT:  Asencion NobleMegan C Griep  37 y.o. female  PRE-OPERATIVE DIAGNOSIS:  respiratory failure  POST-OPERATIVE DIAGNOSIS:  respiratory failure  PROCEDURE:  Procedure(s): TRACHEOSTOMY (N/A)  # 6 Shiley Trach  SURGEON:  Surgeon(s) and Role:    * Ezzard StandingNewman, Kristine Garbehristopher E, MD - Primary  PHYSICIAN ASSISTANT:   ASSISTANTS: none   ANESTHESIA:   general  EBL:  minimal   BLOOD ADMINISTERED:none  DRAINS: none   LOCAL MEDICATIONS USED:  XYLOCAINE with Epi  5 cc  SPECIMEN:  No Specimen  DISPOSITION OF SPECIMEN:  N/A  COUNTS:  YES  TOURNIQUET:  * No tourniquets in log *  DICTATION: .Other Dictation: Dictation Number [id number]  PLAN OF CARE: Discharge to home after PACU  PATIENT DISPOSITION:  PACU - hemodynamically stable.   Delay start of Pharmacological VTE agent (>24hrs) due to surgical blood loss or risk of bleeding: yes

## 2018-05-08 NOTE — Op Note (Signed)
NAME: Lori Gutierrez, Dyan C. MEDICAL RECORD [id number] ACCOUNT [id number] DATE OF BIRTH:[birth year] FACILITY: [facility] LOCATION: [facility] PHYSICIAN:Khira Cudmore Braxton FeathersE. Emmersyn Kratzke, MD  OPERATIVE REPORT  DATE OF PROCEDURE:  05/08/2018  PREOPERATIVE DIAGNOSIS:  Acute on chronic respiratory failure.  POSTOPERATIVE DIAGNOSIS:  Acute on chronic respiratory failure.  OPERATION PERFORMED:  Tracheostomy with a #6 cuffed Shiley tube.  SURGEON:  Dillard Cannonhristopher Ann-Marie Kluge, M.D.  ANESTHESIA:  General endotracheal.  COMPLICATIONS:  None.  ESTIMATED BLOOD LOSS:  Minimal.  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  She has history of chronic end-stage renal disease, is on dialysis.  She has had problems with aspiration and chronic pulmonary problems and is on home O2.  She also  has history of seizure disorder.  She was recently admitted to outside hospital because of increasing respiratory problems.  During hospitalization, she had acute respiratory and cardiac arrest requiring CPR.  The patient was subsequently intubated and  transferred to the ICU.  This occurred about 2 weeks ago.  [facility] intubated on vent.  Attempts at weaning have been unsuccessful and a tracheotomy was recommended.  The patient  was taken to the operating room this time for tracheostomy.  DESCRIPTION OF PROCEDURE:  The [facility] room.  She remained in her bed.  The neck was extended with a roll beneath her shoulders.  The proposed incision site was marked out and  injected with 5 mL of Xylocaine with epinephrine for local hemostasis.  The area was prepped with Betadine solution.  Vertical incision was made just below the cricoid cartilage.  Dissection was carried down through the subcutaneous tissue with a  cautery.  The strap muscles were identified and divided  in the midline and retracted laterally.  The cricoid cartilage was exposed and the first tracheal ring was partially exposed.  The thyroid isthmus was divided with the cautery  to expose the first 3  tracheal rings.  Hemostasis was obtained with cautery.  A horizontal tracheotomy was performed between the first and second tracheal rings.  Endotracheal tube was removed and a #6 cuffed Shiley tube was inserted without any difficulty.  The patient was  ventilated well.  This was secured to the neck with 2-0 silk sutures x4 and a Velcro trach collar around the neck.  The [facility]  in stable condition with good O2 sats and ventilated well.  AN/NUANCE  D:05/08/2018 T:05/08/2018 JOB:[id number]

## 2018-05-08 NOTE — H&P (Signed)
PREOPERATIVE H&P  Chief Complaint: Acute on chronic respiratory failure  HPI: Lori Gutierrez is a 37 y.o. female who presents for evaluation of acute on chronic respiratory failure. Patient with ESRD who is on dialysis. She was admitted to outside hospital with respiratory difficulty felt to be related to aspiration. She apparently had acute respiratory failure and cardiac arrest requiring CPR on 7/29. She was intubated and transferred to the ICU. She had an associated seizure at that time. [facility] int[facility] and on vent. Weaning the patient has been unsuccessful and trach was recommended. Patient with history of home O2 use, CHF and right ulna fx.  She has mental retardation. She's taken to the OR for tracheostomy.   Past Medical History:  Diagnosis Date  . Acute on chronic respiratory failure with hypoxia (HCC)   . Aspiration pneumonia (HCC)   . Cardiac arrest (HCC)   . Chronic systolic heart failure (HCC)   . Delay of cognitive development   . End stage renal disease on dialysis (HCC)   . Hypertension   . Seizure disorder [facility](HCC)    Past Surgical History:  Procedure Laterality Date  . CLOSED REDUCTION PROXIMAL ULNAR FRACTURE     Social History   Socioeconomic History  . Marital status: Single    Spouse name: Not on file  . Number of children: Not on file  . Years of education: Not on file  . Highest education level: Not on file  Occupational History  . Not on file  Social Needs  . Financial resource strain: Not on file  . Food insecurity:    Worry: Not on file    Inability: Not on file  . Transportation needs:    Medical: Not on file    Non-medical: Not on file  Tobacco Use  . Smoking status: Never Smoker  . Smokeless tobacco: Never Used  Substance and Sexual Activity  . Alcohol use: Not Currently  . Drug use: Not Currently  . Sexual activity: Not Currently  Lifestyle  . Physical activity:    Days per week: Not on file   Minutes per session: Not on file  . Stress: Not on file  Relationships  . Social connections:    Talks on phone: Not on file    Gets together: Not on file    Attends religious service: Not on file    Active member of club or organization: Not on file    Attends meetings of clubs or organizations: Not on file    Relationship status: Not on file  Other Topics Concern  . Not on file  Social History Narrative  . Not on file   Family History  Family history unknown: Yes   Not on File Prior to Admission medications   Not on File     Positive ROS: neg  All other systems have been reviewed and were otherwise negative with the exception of those mentioned in the HPI and as above.  Physical Exam: There were no vitals filed for this visit.  General: Intubated and on vent. Not responsive. Oral: Intubated Neck: No palpable adenopathy or thyroid nodules. Trach midline. Cardiovascular: Regular rate and rhythm, no murmur.  Respiratory: Clear to auscultation   Assessment/Plan: respiratory failure Plan for Procedure(s): TRACHEOSTOMY   Dillard Cannonhristopher Kentavious Michele, MD 05/08/2018 7:28 AM

## 2018-05-09 ENCOUNTER — Other Ambulatory Visit (HOSPITAL_COMMUNITY): Payer: Self-pay

## 2018-05-09 DIAGNOSIS — J690 Pneumonitis due to inhalation of food and vomit: Secondary | ICD-10-CM | POA: Diagnosis not present

## 2018-05-09 DIAGNOSIS — J9621 Acute and chronic respiratory failure with hypoxia: Secondary | ICD-10-CM | POA: Diagnosis not present

## 2018-05-09 DIAGNOSIS — I5022 Chronic systolic (congestive) heart failure: Secondary | ICD-10-CM | POA: Diagnosis not present

## 2018-05-09 DIAGNOSIS — I469 Cardiac arrest, cause unspecified: Secondary | ICD-10-CM | POA: Diagnosis not present

## 2018-05-09 LAB — RENAL FUNCTION PANEL
Albumin: 2.6 g/dL — ABNORMAL LOW (ref 3.5–5.0)
Anion gap: 15 (ref 5–15)
BUN: 51 mg/dL — ABNORMAL HIGH (ref 6–20)
CALCIUM: 8.3 mg/dL — AB (ref 8.9–10.3)
CHLORIDE: 90 mmol/L — AB (ref 98–111)
CO2: 24 mmol/L (ref 22–32)
CREATININE: 2.95 mg/dL — AB (ref 0.44–1.00)
GFR calc Af Amer: 22 mL/min — ABNORMAL LOW (ref >60)
GFR calc non Af Amer: 19 mL/min — ABNORMAL LOW (ref >60)
GLUCOSE: 96 mg/dL (ref 70–99)
Phosphorus: 3.7 mg/dL (ref 2.5–4.6)
Potassium: 4.4 mmol/L (ref 3.5–5.1)
SODIUM: 129 mmol/L — AB (ref 135–145)

## 2018-05-09 LAB — CBC
HCT: 28.4 % — ABNORMAL LOW (ref 36.0–46.0)
Hemoglobin: 8.9 g/dL — ABNORMAL LOW (ref 12.0–15.0)
MCH: 28.1 pg (ref 26.0–34.0)
MCHC: 31.3 g/dL (ref 30.0–36.0)
MCV: 89.6 fL (ref 78.0–100.0)
PLATELETS: 373 10*3/uL (ref 150–400)
RBC: 3.17 MIL/uL — AB (ref 3.87–5.11)
RDW: 19.4 % — ABNORMAL HIGH (ref 11.5–15.5)
WBC: 11.1 10*3/uL — ABNORMAL HIGH (ref 4.0–10.5)

## 2018-05-09 NOTE — Progress Notes (Signed)
Pulmonary Critical Care [facility]   PULMONARY SERVICE  PROGRESS NOTE  Date of Service: 05/09/2018  Lori Gutierrez  ZOX:[id number]  DOB: Jan 14, 1981   DOA: 04/30/2018  Referring Physician: Carron CurieAli Hijazi, MD  HPI: Lori NobleMegan C Gutierrez is a 37 y.o. female seen for follow up of Acute on Chronic Respiratory Failure.  She is more awake comfortable without distress on dialysis this morning remains on assist control mode  Medications: Reviewed on Rounds  Physical Exam:  Vitals: Temperature 97.2 pulse 73 respiratory 27 blood pressure 140/97 saturations 95%  Ventilator Settings mode of ventilation assist control FiO2 30% tidal volume 544 PEEP 5  . General: Comfortable at this time . Eyes: Grossly normal lids, irises & conjunctiva . ENT: grossly tongue is normal . Neck: no obvious mass . Cardiovascular: S1 S2 normal no gallop . Respiratory: No rhonchi or rales are noted . Abdomen: soft . Skin: no rash seen on limited exam . Musculoskeletal: not rigid . Psychiatric:unable to assess . Neurologic: no seizure no involuntary movements         Lab Data:   Basic Metabolic Panel: Recent Labs  Lab 05/05/18 0654 05/06/18 0540 05/07/18 0457 05/09/18 0450  NA 129* 132* 131* 129*  K 5.7* 3.6 3.7 4.4  CL 89* 90* 86* 90*  CO2 23 27 25 24   GLUCOSE 94 94 82 96  BUN 45* 38* 52* 51*  CREATININE 3.99* 3.01* 3.53* 2.95*  CALCIUM 8.0* 7.9* 8.2* 8.3*  MG  --  1.9  --   --   PHOS 4.6 4.0 4.1 3.7    Liver Function Tests: Recent Labs  Lab 05/05/18 0654 05/07/18 0457 05/09/18 0450  ALBUMIN 2.6* 2.5* 2.6*   No results for input(s): LIPASE, AMYLASE in the last 168 hours. No results for input(s): AMMONIA in the last 168 hours.  CBC: Recent Labs  Lab 05/04/18 0632 05/05/18 0654 05/07/18 0457 05/08/18 0644 05/09/18 0623  WBC  --  13.6* 13.6* 11.2* 11.1*  HGB 7.7* 7.4* 7.3* 8.3* 8.9*  HCT 25.4* 23.7* 23.3* 25.4* 28.4*  MCV  --  85.3 86.3 86.7 89.6  PLT  --  335  325 297 373    Cardiac Enzymes: No results for input(s): CKTOTAL, CKMB, CKMBINDEX, TROPONINI in the last 168 hours.  BNP (last 3 results) No results for input(s): BNP in the last 8760 hours.  ProBNP (last 3 results) No results for input(s): PROBNP in the last 8760 hours.  Radiological Exams: No results found.  Assessment/Plan Principal Problem:   Acute on chronic respiratory failure with hypoxia (HCC) Active Problems:   Aspiration pneumonia (HCC)   Seizure disorder (HCC)   Cardiac arrest (HCC)   End stage renal disease on dialysis (HCC)   Chronic systolic heart failure (HCC)   1. Acute on chronic respiratory failure with hypoxia patient is comfortable right now on full support respiratory therapy will check the rapid shallow index and try to start weaning. 2. Pneumonia due to aspiration treated we will continue to follow 3. Seizure disorder at baseline 4. Cardiac arrest at baseline stable rhythm 5. End-stage renal disease on hemodialysis continue 6. Chronic systolic heart failure at baseline   I have personally seen and evaluated the patient, evaluated laboratory and imaging results, formulated the assessment and plan and placed orders. The Patient requires high complexity decision making for assessment and support.  Case was discussed on Rounds with the Respiratory Therapy Staff  Yevonne PaxSaadat A Khan, MD [facility] Pulmonary Critical Care Medicine Sleep Medicine

## 2018-05-10 DIAGNOSIS — I469 Cardiac arrest, cause unspecified: Secondary | ICD-10-CM | POA: Diagnosis not present

## 2018-05-10 DIAGNOSIS — J9621 Acute and chronic respiratory failure with hypoxia: Secondary | ICD-10-CM | POA: Diagnosis not present

## 2018-05-10 DIAGNOSIS — J690 Pneumonitis due to inhalation of food and vomit: Secondary | ICD-10-CM | POA: Diagnosis not present

## 2018-05-10 DIAGNOSIS — I5022 Chronic systolic (congestive) heart failure: Secondary | ICD-10-CM | POA: Diagnosis not present

## 2018-05-10 NOTE — Progress Notes (Signed)
Pulmonary Critical Care Medicine [facility] [place]   PULMONARY SERVICE  PROGRESS NOTE  Date of Service: 05/10/2018  Lori Gutierrez  RUE:[id number]  DOB: July 21, 1981   DOA: 04/30/2018  Referring Physician: Carron Curie, MD  HPI: Lori Gutierrez is a 37 y.o. female seen for follow up of Acute on Chronic Respiratory Failure.  She is more awake comfortable without distress remains on the ventilator she has not yet been weaned down to pressure support.  Respiratory is going to assess the RSBI today  Medications: Reviewed on Rounds  Physical Exam:  Vitals: Temperature is 97.9 pulse 78 respiratory rate 17 blood pressure 197/79 saturations 93%  Ventilator Settings mode of ventilation is assist control FiO2 30% tidal volume 517 PEEP 5  . General: Comfortable at this time . Eyes: Grossly normal lids, irises & conjunctiva . ENT: grossly tongue is normal . Neck: no obvious mass . Cardiovascular: S1 S2 normal no gallop . Respiratory: Coarse rhonchi noted bilaterally . Abdomen: soft . Skin: no rash seen on limited exam . Musculoskeletal: not rigid . Psychiatric:unable to assess . Neurologic: no seizure no involuntary movements         Lab Data:   Basic Metabolic Panel: Recent Labs  Lab 05/05/18 0654 05/06/18 0540 05/07/18 0457 05/09/18 0450  NA 129* 132* 131* 129*  K 5.7* 3.6 3.7 4.4  CL 89* 90* 86* 90*  CO2 23 27 25 24   GLUCOSE 94 94 82 96  BUN 45* 38* 52* 51*  CREATININE 3.99* 3.01* 3.53* 2.95*  CALCIUM 8.0* 7.9* 8.2* 8.3*  MG  --  1.9  --   --   PHOS 4.6 4.0 4.1 3.7    Liver Function Tests: Recent Labs  Lab 05/05/18 0654 05/07/18 0457 05/09/18 0450  ALBUMIN 2.6* 2.5* 2.6*   No results for input(s): LIPASE, AMYLASE in the last 168 hours. No results for input(s): AMMONIA in the last 168 hours.  CBC: Recent Labs  Lab 05/04/18 0632 05/05/18 0654 05/07/18 0457 05/08/18 0644 05/09/18 0623  WBC  --  13.6* 13.6* 11.2* 11.1*  HGB 7.7* 7.4* 7.3* 8.3*  8.9*  HCT 25.4* 23.7* 23.3* 25.4* 28.4*  MCV  --  85.3 86.3 86.7 89.6  PLT  --  335 325 297 373    Cardiac Enzymes: No results for input(s): CKTOTAL, CKMB, CKMBINDEX, TROPONINI in the last 168 hours.  BNP (last 3 results) No results for input(s): BNP in the last 8760 hours.  ProBNP (last 3 results) No results for input(s): PROBNP in the last 8760 hours.  Radiological Exams: No results found.  Assessment/Plan Principal Problem:   Acute on chronic respiratory failure with hypoxia (HCC) Active Problems:   Aspiration pneumonia (HCC)   Seizure disorder (HCC)   Cardiac arrest (HCC)   End stage renal disease on dialysis (HCC)   Chronic systolic heart failure (HCC)   1. Acute on chronic respiratory failure with hypoxia continue with assessing the spontaneous breathing index.  Right now not able to tolerate we will reassess again tomorrow continue secretion management pulmonary toilet prognosis guarded 2. Pneumonia due to aspiration patient's been treated follow-up x-ray as necessary. 3. Status post cardiac arrest stable will follow 4. End-stage renal disease on dialysis followed by nephrology will follow the recommendations. 5. Chronic systolic heart failure compensated   I have personally seen and evaluated the patient, evaluated laboratory and imaging results, formulated the assessment and plan and placed orders. The Patient requires high complexity decision making for assessment and support.  Case was discussed on Rounds with the Respiratory Therapy Staff  Yevonne PaxSaadat A Edee Nifong, MD [facility] Pulmonary Critical Care Medicine Sleep Medicine

## 2018-05-10 NOTE — Anesthesia Postprocedure Evaluation (Signed)
Anesthesia Post Note  Patient: Lori Gutierrez  Procedure(s) Performed: TRACHEOSTOMY (N/A Neck)     Patient location during evaluation: PACU Anesthesia Type: General Level of consciousness: awake and alert Pain management: pain level controlled Vital Signs Assessment: post-procedure vital signs reviewed and stable Respiratory status: spontaneous breathing, nonlabored ventilation, respiratory function stable and patient connected to nasal cannula oxygen Cardiovascular status: blood pressure returned to baseline and stable Postop Assessment: no apparent nausea or vomiting Anesthetic complications: no    Last Vitals: There were no vitals filed for this visit.  Last Pain: There were no vitals filed for this visit.               Mukesh Kornegay

## 2018-05-11 ENCOUNTER — Other Ambulatory Visit (HOSPITAL_COMMUNITY): Payer: Self-pay

## 2018-05-11 ENCOUNTER — Encounter (HOSPITAL_COMMUNITY): Payer: Self-pay | Admitting: Otolaryngology

## 2018-05-11 DIAGNOSIS — I5022 Chronic systolic (congestive) heart failure: Secondary | ICD-10-CM | POA: Diagnosis not present

## 2018-05-11 DIAGNOSIS — J9621 Acute and chronic respiratory failure with hypoxia: Secondary | ICD-10-CM | POA: Diagnosis not present

## 2018-05-11 DIAGNOSIS — J690 Pneumonitis due to inhalation of food and vomit: Secondary | ICD-10-CM | POA: Diagnosis not present

## 2018-05-11 DIAGNOSIS — I469 Cardiac arrest, cause unspecified: Secondary | ICD-10-CM | POA: Diagnosis not present

## 2018-05-11 LAB — RENAL FUNCTION PANEL
ALBUMIN: 2.7 g/dL — AB (ref 3.5–5.0)
Anion gap: 17 — ABNORMAL HIGH (ref 5–15)
BUN: 69 mg/dL — ABNORMAL HIGH (ref 6–20)
CO2: 24 mmol/L (ref 22–32)
Calcium: 8.7 mg/dL — ABNORMAL LOW (ref 8.9–10.3)
Chloride: 87 mmol/L — ABNORMAL LOW (ref 98–111)
Creatinine, Ser: 3.19 mg/dL — ABNORMAL HIGH (ref 0.44–1.00)
GFR calc non Af Amer: 18 mL/min — ABNORMAL LOW (ref >60)
GFR, EST AFRICAN AMERICAN: 20 mL/min — AB (ref >60)
GLUCOSE: 100 mg/dL — AB (ref 70–99)
POTASSIUM: 3.8 mmol/L (ref 3.5–5.1)
Phosphorus: 3.8 mg/dL (ref 2.5–4.6)
SODIUM: 128 mmol/L — AB (ref 135–145)

## 2018-05-11 LAB — CBC
HEMATOCRIT: 29.7 % — AB (ref 36.0–46.0)
Hemoglobin: 9.2 g/dL — ABNORMAL LOW (ref 12.0–15.0)
MCH: 28 pg (ref 26.0–34.0)
MCHC: 31 g/dL (ref 30.0–36.0)
MCV: 90.3 fL (ref 78.0–100.0)
Platelets: 379 10*3/uL (ref 150–400)
RBC: 3.29 MIL/uL — AB (ref 3.87–5.11)
RDW: 19.9 % — ABNORMAL HIGH (ref 11.5–15.5)
WBC: 14.6 10*3/uL — ABNORMAL HIGH (ref 4.0–10.5)

## 2018-05-11 LAB — MAGNESIUM: Magnesium: 2.4 mg/dL (ref 1.7–2.4)

## 2018-05-11 NOTE — Progress Notes (Signed)
Pulmonary Critical Care [facility]   PULMONARY SERVICE  PROGRESS NOTE  Date of Service: 05/11/2018  Lori Gutierrez  MWU:[id number]  DOB: 14-Feb-1981   DOA: 04/30/2018  Referring Physician: Carron CurieAli Hijazi, MD  HPI: Lori Gutierrez is a 37 y.o. female seen for follow up of Acute on Chronic Respiratory Failure.  Apparently was started back on the propofol spoke with the wean team on rounds.  Patient is not weaning basically because of the propofol at this time.  Reportedly agitated.  Patient remains on full support right now assist control mode on 35% FiO2 and has been failing weaning attempts  Medications: Reviewed on Rounds  Physical Exam:  Vitals: Temperature 98.1 pulse 81 respiratory 28 blood pressure 164/67  Ventilator Settings mode of ventilation assist control FiO2 35% tidal volume 310 PEEP 5  . General: Comfortable at this time . Eyes: Grossly normal lids, irises & conjunctiva . ENT: grossly tongue is normal . Neck: no obvious mass . Cardiovascular: S1 S2 normal no gallop . Respiratory: No rhonchi or rales are noted at this time . Abdomen: soft . Skin: no rash seen on limited exam . Musculoskeletal: not rigid . Psychiatric:unable to assess . Neurologic: no seizure no involuntary movements         Lab Data:   Basic Metabolic Panel: Recent Labs  Lab 05/05/18 0654 05/06/18 0540 05/07/18 0457 05/09/18 0450 05/11/18 0451  NA 129* 132* 131* 129* 128*  K 5.7* 3.6 3.7 4.4 3.8  CL 89* 90* 86* 90* 87*  CO2 23 27 25 24 24   GLUCOSE 94 94 82 96 100*  BUN 45* 38* 52* 51* 69*  CREATININE 3.99* 3.01* 3.53* 2.95* 3.19*  CALCIUM 8.0* 7.9* 8.2* 8.3* 8.7*  MG  --  1.9  --   --  2.4  PHOS 4.6 4.0 4.1 3.7 3.8    Liver Function Tests: Recent Labs  Lab 05/05/18 0654 05/07/18 0457 05/09/18 0450 05/11/18 0451  ALBUMIN 2.6* 2.5* 2.6* 2.7*   No results for input(s): LIPASE, AMYLASE in the last 168 hours. No results for input(s): AMMONIA in the last  168 hours.  CBC: Recent Labs  Lab 05/05/18 0654 05/07/18 0457 05/08/18 0644 05/09/18 0623 05/11/18 0451  WBC 13.6* 13.6* 11.2* 11.1* 14.6*  HGB 7.4* 7.3* 8.3* 8.9* 9.2*  HCT 23.7* 23.3* 25.4* 28.4* 29.7*  MCV 85.3 86.3 86.7 89.6 90.3  PLT 335 325 297 373 379    Cardiac Enzymes: No results for input(s): CKTOTAL, CKMB, CKMBINDEX, TROPONINI in the last 168 hours.  BNP (last 3 results) No results for input(s): BNP in the last 8760 hours.  ProBNP (last 3 results) No results for input(s): PROBNP in the last 8760 hours.  Radiological Exams: Dg Chest Port 1 View  Result Date: 05/11/2018 CLINICAL DATA:  Respiratory failure. EXAM: PORTABLE CHEST 1 VIEW COMPARISON:  04/30/2018 and prior radiographs FINDINGS: A tracheostomy tube is now noted with tip 2.7 cm above the carina. A LEFT dialysis catheter with tips overlying the UPPER and mid RIGHT atrium again noted. Cardiomegaly, pulmonary vascular congestion and minimal interstitial opacities noted. Improved aeration within the LEFT LOWER lung noted. There is no evidence of pneumothorax. An NG tube is identified entering the stomach. IMPRESSION: Tracheostomy tube placement with pulmonary vascular congestion and possible minimal interstitial edema. Improved LEFT LOWER lung aeration. Electronically Signed   By: Harmon PierJeffrey  Hu M.D.   On: 05/11/2018 07:44    Assessment/Plan Principal Problem:   Acute on chronic respiratory failure with  hypoxia (HCC) Active Problems:   Aspiration pneumonia (HCC)   Seizure disorder (HCC)   Cardiac arrest (HCC)   End stage renal disease on dialysis (HCC)   Chronic systolic heart failure (HCC)   1. Acute on chronic respiratory failure with hypoxia we will continue with full support right now.  Going to try to wean the propofol back off.  We will continue pulmonary toilet secretion management. 2. Aspiration pneumonia treated with antibiotics we will continue to follow follow-up x-ray was done showing some minimal  interstitial edema. 3. Seizure disorder no active seizures noted. 4. End-stage renal disease on hemodialysis patient is being followed by nephrology for dialysis 5. Chronic systolic heart wound is both still has some interstitial edema needed to maybe take off more fluid. 6. Status post cardiac arrest rhythm is stable at baseline continue present management   I have personally seen and evaluated the patient, evaluated laboratory and imaging results, formulated the assessment and plan and placed orders. The Patient requires high complexity decision making for assessment and support.  Case was discussed on Rounds with the Respiratory Therapy Staff time 35 minutes patient is on critical drips including the propofol which will need to try to wean down.  Discussed with primary care team and the wean team during rounds  Yevonne PaxSaadat A Zachory Mangual, MD [facility] Pulmonary Critical Care Medicine Sleep Medicine

## 2018-05-11 NOTE — Progress Notes (Signed)
[facility]  ROUNDING NOTE   Subjective:  Patient seen at bedside. Still on the ventilator. Patient will be due for dialysis again today.   Objective:  Vital signs in last 24 hours:  Temperature 98.1 pulse 81 respirations 28 blood pressure 142/97  Physical Exam: General: Critically ill appearing  Head: [place]/AT NG in place  Eyes: Anicteric  Neck: Tracheostomy in place  Lungs:  Scattered rhonchi, vent assisted  Heart: S1S2 no rubs  Abdomen:  Soft, nontender, bowel sounds present  Extremities: trace peripheral edema.  Neurologic: Awake but not following commands  Skin: No lesions  Access: L IJ permcath    Basic Metabolic Panel: Recent Labs  Lab 05/05/18 0654 05/06/18 0540 05/07/18 0457 05/09/18 0450 05/11/18 0451  NA 129* 132* 131* 129* 128*  K 5.7* 3.6 3.7 4.4 3.8  CL 89* 90* 86* 90* 87*  CO2 23 27 25 24 24   GLUCOSE 94 94 82 96 100*  BUN 45* 38* 52* 51* 69*  CREATININE 3.99* 3.01* 3.53* 2.95* 3.19*  CALCIUM 8.0* 7.9* 8.2* 8.3* 8.7*  MG  --  1.9  --   --  2.4  PHOS 4.6 4.0 4.1 3.7 3.8    Liver Function Tests: Recent Labs  Lab 05/05/18 0654 05/07/18 0457 05/09/18 0450 05/11/18 0451  ALBUMIN 2.6* 2.5* 2.6* 2.7*   No results for input(s): LIPASE, AMYLASE in the last 168 hours. No results for input(s): AMMONIA in the last 168 hours.  CBC: Recent Labs  Lab 05/05/18 0654 05/07/18 0457 05/08/18 0644 05/09/18 0623 05/11/18 0451  WBC 13.6* 13.6* 11.2* 11.1* 14.6*  HGB 7.4* 7.3* 8.3* 8.9* 9.2*  HCT 23.7* 23.3* 25.4* 28.4* 29.7*  MCV 85.3 86.3 86.7 89.6 90.3  PLT 335 325 297 373 379    Cardiac Enzymes: No results for input(s): CKTOTAL, CKMB, CKMBINDEX, TROPONINI in the last 168 hours.  BNP: Invalid input(s): POCBNP  CBG: No results for input(s): GLUCAP in the last 168 hours.  Microbiology: Results for orders placed or performed during the hospital encounter of 04/30/18  Culture, respiratory (non-expectorated)     Status: None   Collection Time: 05/01/18  7:52 AM  Result Value Ref Range Status   Specimen Description TRACHEAL ASPIRATE  Final   Special Requests NONE  Final   Gram Stain   Final    ABUNDANT WBC PRESENT,BOTH PMN AND MONONUCLEAR NO ORGANISMS SEEN    Culture   Final    RARE Consistent with normal respiratory flora. Performed at [facility], [address] [place], [place] [address]    Report Status 05/03/2018 FINAL  Final    Coagulation Studies: No results for input(s): LABPROT, INR in the last 72 hours.  Urinalysis: No results for input(s): COLORURINE, LABSPEC, PHURINE, GLUCOSEU, HGBUR, BILIRUBINUR, KETONESUR, PROTEINUR, UROBILINOGEN, NITRITE, LEUKOCYTESUR in the last 72 hours.  Invalid input(s): APPERANCEUR    Imaging: Dg Chest Port 1 View  Result Date: 05/11/2018 CLINICAL DATA:  Respiratory failure. EXAM: PORTABLE CHEST 1 VIEW COMPARISON:  04/30/2018 and prior radiographs FINDINGS: A tracheostomy tube is now noted with tip 2.7 cm above the carina. A LEFT dialysis catheter with tips overlying the UPPER and mid RIGHT atrium again noted. Cardiomegaly, pulmonary vascular congestion and minimal interstitial opacities noted. Improved aeration within the LEFT LOWER lung noted. There is no evidence of pneumothorax. An NG tube is identified entering the stomach. IMPRESSION: Tracheostomy tube placement with pulmonary vascular congestion and possible minimal interstitial edema. Improved LEFT LOWER lung aeration. Electronically Signed  By: Harmon PierJeffrey  Hu M.D.   On: 05/11/2018 07:44     Medications:       Assessment/ Plan:  37 y.o. female with a PMHx of recent acute respiratory failure requiring intubation, aspiration pneumonia, cardiac arrest, chronic systolic heart failure, delay of cognitive development, end-stage renal disease on hemodialysis MWF, hypertension, seizure disorder, who was admitted to [facility] on 04/30/2018 for ongoing management of acute respiratory failure, recent PEA  arrest, and end-stage renal disease.   1.  ESRD on HD.    Patient due for dialysis today.  Orders have been prepared.  2.  Acute respiratory failure.  Secondary to aspiration.   -Still requiring ventilatory support.  Weaning efforts as per pulmonary/critical care.  3.  Anemia of chronic kidney disease.    Hemoglobin up to 9.2.  Continue to monitor.  4.  Secondary hyperparathyroidism.  Phosphorus 3.8 on Renvela powder.  Continue to periodically monitor.  5.  Hypertension: Blood pressure continues to be labile but is better this a.m. at 142/97.  We will continue to monitor the trend.    LOS: 0 Adalei Novell 8/12/20198:31 AM

## 2018-05-12 ENCOUNTER — Ambulatory Visit (HOSPITAL_COMMUNITY): Payer: Self-pay | Attending: General Practice

## 2018-05-12 ENCOUNTER — Other Ambulatory Visit (HOSPITAL_COMMUNITY): Payer: Self-pay

## 2018-05-12 DIAGNOSIS — M7989 Other specified soft tissue disorders: Secondary | ICD-10-CM | POA: Insufficient documentation

## 2018-05-12 DIAGNOSIS — J9621 Acute and chronic respiratory failure with hypoxia: Secondary | ICD-10-CM | POA: Diagnosis not present

## 2018-05-12 DIAGNOSIS — I5022 Chronic systolic (congestive) heart failure: Secondary | ICD-10-CM | POA: Diagnosis not present

## 2018-05-12 DIAGNOSIS — J690 Pneumonitis due to inhalation of food and vomit: Secondary | ICD-10-CM | POA: Diagnosis not present

## 2018-05-12 DIAGNOSIS — I469 Cardiac arrest, cause unspecified: Secondary | ICD-10-CM | POA: Diagnosis not present

## 2018-05-12 LAB — HEMOGLOBIN AND HEMATOCRIT, BLOOD
HCT: 29.5 % — ABNORMAL LOW (ref 36.0–46.0)
HEMATOCRIT: 30.4 % — AB (ref 36.0–46.0)
HEMOGLOBIN: 9.1 g/dL — AB (ref 12.0–15.0)
Hemoglobin: 9.5 g/dL — ABNORMAL LOW (ref 12.0–15.0)

## 2018-05-12 NOTE — Progress Notes (Signed)
IR requested by Merril Abbehun Li, NP for possible image-guided percutaneous gastrostomy tube placement.  CT abdomen reviewed by Dr. Fredia SorrowYamagata who states patient is not a candidate for percutaneous gastrostomy tube placement due to anatomy.  From CT abdomen report: "The liver is significantly enlarged with a large left lobe extending into the far left lateral abdomen. The majority of the stomach lies posterior to the liver. The distal stomach lies posterior to part of the transverse colon and also small bowel loops. Even with gastric insufflation, risk of liver injury or bowel perforation with attempted percutaneous gastrostomy tube placement would be quite high."  Spoke with Dr. Baldo DaubVarghese to inform her that, per Dr. Fredia SorrowYamagata, patient is not a candidate for percutaneous gastrostomy tube placement.  All questions answered and concerns addressed. IR available in future if needed.  Waylan Bogalexandra M Louk, PA-C 05/12/2018, 4:28 PM

## 2018-05-12 NOTE — Progress Notes (Signed)
*  Preliminary Results* Left upper extremity venous duplex completed. Visualized veins of the left upper extremity are patent without any obvious evidence of deep vein thrombosis.  Incidental finding: there is a large, vascularized heterogenous area extending from the antecubital fossa to the mid upper arm, with an anechoic area at the antecubital fossa. The heterogenous area is suggestive of a possible hematoma. The anechoic area is extending from the distal brachial artery area, with a visible neck containing to-fro flow. This is suggestive of a pseudoaneurysm.  Preliminary results discussed with Camelia Engriya Varghese, PA-C.  05/12/2018 4:32 PM  Gertie FeyMichelle Sheriff Rodenberg, MHA, RVT, RDCS, RDMS

## 2018-05-12 NOTE — Progress Notes (Signed)
Pulmonary Critical Care Medicine [facility] [place]   PULMONARY SERVICE  PROGRESS NOTE  Date of Service: 05/12/2018  Lori NobleMegan C Gutierrez  ZOX:[id number]  DOB: 1980/11/02   DOA: 04/30/2018  Referring Physician: Carron CurieAli Hijazi, MD  HPI: Lori Gutierrez is a 37 y.o. female seen for follow up of Acute on Chronic Respiratory Failure.  Patient currently is on full support assist control mode was attempted on pressure support failed.  Right now is resting she is on 35% FiO2.  Currently is off the propofol  Medications: Reviewed on Rounds  Physical Exam:  Vitals: Temperature 98.0 pulse 86 respiratory rate 13 blood pressure 180/115 saturations are 99%  Ventilator Settings mode of ventilation assist control FiO2 35% tidal volume is 568 PEEP 5  . General: Comfortable at this time . Eyes: Grossly normal lids, irises & conjunctiva . ENT: grossly tongue is normal . Neck: no obvious mass . Cardiovascular: S1 S2 normal no gallop . Respiratory: Coarse breath sounds no rhonchi . Abdomen: soft . Skin: no rash seen on limited exam . Musculoskeletal: not rigid . Psychiatric:unable to assess . Neurologic: no seizure no involuntary movements         Lab Data:   Basic Metabolic Panel: Recent Labs  Lab 05/06/18 0540 05/07/18 0457 05/09/18 0450 05/11/18 0451  NA 132* 131* 129* 128*  K 3.6 3.7 4.4 3.8  CL 90* 86* 90* 87*  CO2 27 25 24 24   GLUCOSE 94 82 96 100*  BUN 38* 52* 51* 69*  CREATININE 3.01* 3.53* 2.95* 3.19*  CALCIUM 7.9* 8.2* 8.3* 8.7*  MG 1.9  --   --  2.4  PHOS 4.0 4.1 3.7 3.8    Liver Function Tests: Recent Labs  Lab 05/07/18 0457 05/09/18 0450 05/11/18 0451  ALBUMIN 2.5* 2.6* 2.7*   No results for input(s): LIPASE, AMYLASE in the last 168 hours. No results for input(s): AMMONIA in the last 168 hours.  CBC: Recent Labs  Lab 05/07/18 0457 05/08/18 0644 05/09/18 0623 05/11/18 0451  WBC 13.6* 11.2* 11.1* 14.6*  HGB 7.3* 8.3* 8.9* 9.2*  HCT 23.3* 25.4* 28.4*  29.7*  MCV 86.3 86.7 89.6 90.3  PLT 325 297 373 379    Cardiac Enzymes: No results for input(s): CKTOTAL, CKMB, CKMBINDEX, TROPONINI in the last 168 hours.  BNP (last 3 results) No results for input(s): BNP in the last 8760 hours.  ProBNP (last 3 results) No results for input(s): PROBNP in the last 8760 hours.  Radiological Exams: Dg Chest Port 1 View  Result Date: 05/11/2018 CLINICAL DATA:  Respiratory failure. EXAM: PORTABLE CHEST 1 VIEW COMPARISON:  04/30/2018 and prior radiographs FINDINGS: A tracheostomy tube is now noted with tip 2.7 cm above the carina. A LEFT dialysis catheter with tips overlying the UPPER and mid RIGHT atrium again noted. Cardiomegaly, pulmonary vascular congestion and minimal interstitial opacities noted. Improved aeration within the LEFT LOWER lung noted. There is no evidence of pneumothorax. An NG tube is identified entering the stomach. IMPRESSION: Tracheostomy tube placement with pulmonary vascular congestion and possible minimal interstitial edema. Improved LEFT LOWER lung aeration. Electronically Signed   By: Harmon PierJeffrey  Hu M.D.   On: 05/11/2018 07:44    Assessment/Plan Principal Problem:   Acute on chronic respiratory failure with hypoxia (HCC) Active Problems:   Aspiration pneumonia (HCC)   Seizure disorder (HCC)   Cardiac arrest (HCC)   End stage renal disease on dialysis (HCC)   Chronic systolic heart failure (HCC)   1. Acute on chronic respiratory  failure with hypoxia patient right now is on the ventilator full support as mentioned we will check the spontaneous breathing index and try to resume the wean.  Continue with supportive care. 2. Pneumonia due to aspiration treated chest film showed minimal interstitial densities 3. Seizure disorder no active seizures 4. Cardiac arrest stable 5. End-stage renal disease on dialysis continue per nephrology recommendations. 6. Chronic systolic heart failure as noted above fluid still noted on the last chest  x-ray absent more fluid off during dialysis if she is able to tolerate.   I have personally seen and evaluated the patient, evaluated laboratory and imaging results, formulated the assessment and plan and placed orders. The Patient requires high complexity decision making for assessment and support.  Case was discussed on Rounds with the Respiratory Therapy Staff  Lori PaxSaadat A Mikhi Athey, MD [facility] Pulmonary Critical Care Medicine Sleep Medicine

## 2018-05-13 DIAGNOSIS — Z992 Dependence on renal dialysis: Secondary | ICD-10-CM

## 2018-05-13 DIAGNOSIS — J9621 Acute and chronic respiratory failure with hypoxia: Secondary | ICD-10-CM | POA: Diagnosis not present

## 2018-05-13 DIAGNOSIS — R2232 Localized swelling, mass and lump, left upper limb: Secondary | ICD-10-CM

## 2018-05-13 DIAGNOSIS — I469 Cardiac arrest, cause unspecified: Secondary | ICD-10-CM | POA: Diagnosis not present

## 2018-05-13 DIAGNOSIS — I5022 Chronic systolic (congestive) heart failure: Secondary | ICD-10-CM | POA: Diagnosis not present

## 2018-05-13 DIAGNOSIS — J690 Pneumonitis due to inhalation of food and vomit: Secondary | ICD-10-CM | POA: Diagnosis not present

## 2018-05-13 DIAGNOSIS — N186 End stage renal disease: Secondary | ICD-10-CM

## 2018-05-13 LAB — RENAL FUNCTION PANEL
Albumin: 2.9 g/dL — ABNORMAL LOW (ref 3.5–5.0)
Anion gap: 16 — ABNORMAL HIGH (ref 5–15)
BUN: 73 mg/dL — ABNORMAL HIGH (ref 6–20)
CHLORIDE: 89 mmol/L — AB (ref 98–111)
CO2: 23 mmol/L (ref 22–32)
Calcium: 9.2 mg/dL (ref 8.9–10.3)
Creatinine, Ser: 3.01 mg/dL — ABNORMAL HIGH (ref 0.44–1.00)
GFR calc Af Amer: 22 mL/min — ABNORMAL LOW (ref >60)
GFR, EST NON AFRICAN AMERICAN: 19 mL/min — AB (ref >60)
Glucose, Bld: 119 mg/dL — ABNORMAL HIGH (ref 70–99)
POTASSIUM: 4 mmol/L (ref 3.5–5.1)
Phosphorus: 5 mg/dL — ABNORMAL HIGH (ref 2.5–4.6)
Sodium: 128 mmol/L — ABNORMAL LOW (ref 135–145)

## 2018-05-13 LAB — URINALYSIS, ROUTINE W REFLEX MICROSCOPIC
BILIRUBIN URINE: NEGATIVE
Glucose, UA: NEGATIVE mg/dL
Ketones, ur: NEGATIVE mg/dL
Leukocytes, UA: NEGATIVE
Nitrite: NEGATIVE
Specific Gravity, Urine: 1.011 (ref 1.005–1.030)
pH: 7 (ref 5.0–8.0)

## 2018-05-13 LAB — CBC
HEMATOCRIT: 29.4 % — AB (ref 36.0–46.0)
Hemoglobin: 9.1 g/dL — ABNORMAL LOW (ref 12.0–15.0)
MCH: 28 pg (ref 26.0–34.0)
MCHC: 31 g/dL (ref 30.0–36.0)
MCV: 90.5 fL (ref 78.0–100.0)
Platelets: 374 10*3/uL (ref 150–400)
RBC: 3.25 MIL/uL — ABNORMAL LOW (ref 3.87–5.11)
RDW: 19.5 % — AB (ref 11.5–15.5)
WBC: 17.8 10*3/uL — ABNORMAL HIGH (ref 4.0–10.5)

## 2018-05-13 NOTE — Progress Notes (Signed)
[facility]  ROUNDING NOTE   Subjective:  Patient due for hemodialysis today. Patient resting comfortably in bed at the moment. NG remains in place. Percutaneous gastrostomy cannot be placed secondary to enlarged liver.   Objective:  Vital signs in last 24 hours:  Temperature 98.1 pulse 81 respirations 28 blood pressure 142/97  Physical Exam: General: Critically ill appearing  Head: [place]/AT NG in place  Eyes: Anicteric  Neck: Tracheostomy in place  Lungs:  Scattered rhonchi, vent assisted  Heart: S1S2 no rubs  Abdomen:  Soft, nontender, bowel sounds present  Extremities: trace peripheral edema.  Neurologic: Awake but not following commands  Skin: No lesions  Access: L IJ permcath    Basic Metabolic Panel: Recent Labs  Lab 05/07/18 0457 05/09/18 0450 05/11/18 0451 05/13/18 0436  NA 131* 129* 128* 128*  K 3.7 4.4 3.8 4.0  CL 86* 90* 87* 89*  CO2 25 24 24 23   GLUCOSE 82 96 100* 119*  BUN 52* 51* 69* 73*  CREATININE 3.53* 2.95* 3.19* 3.01*  CALCIUM 8.2* 8.3* 8.7* 9.2  MG  --   --  2.4  --   PHOS 4.1 3.7 3.8 5.0*    Liver Function Tests: Recent Labs  Lab 05/07/18 0457 05/09/18 0450 05/11/18 0451 05/13/18 0436  ALBUMIN 2.5* 2.6* 2.7* 2.9*   No results for input(s): LIPASE, AMYLASE in the last 168 hours. No results for input(s): AMMONIA in the last 168 hours.  CBC: Recent Labs  Lab 05/07/18 0457 05/08/18 0644 05/09/18 0623 05/11/18 0451 05/12/18 1651 05/12/18 2221 05/13/18 0436  WBC 13.6* 11.2* 11.1* 14.6*  --   --  17.8*  HGB 7.3* 8.3* 8.9* 9.2* 9.5* 9.1* 9.1*  HCT 23.3* 25.4* 28.4* 29.7* 30.4* 29.5* 29.4*  MCV 86.3 86.7 89.6 90.3  --   --  90.5  PLT 325 297 373 379  --   --  374    Cardiac Enzymes: No results for input(s): CKTOTAL, CKMB, CKMBINDEX, TROPONINI in the last 168 hours.  BNP: Invalid input(s): POCBNP  CBG: No results for input(s): GLUCAP in the last 168 hours.  Microbiology: Results for orders placed or performed  during the hospital encounter of 04/30/18  Culture, respiratory (non-expectorated)     Status: None   Collection Time: 05/01/18  7:52 AM  Result Value Ref Range Status   Specimen Description TRACHEAL ASPIRATE  Final   Special Requests NONE  Final   Gram Stain   Final    ABUNDANT WBC PRESENT,BOTH PMN AND MONONUCLEAR NO ORGANISMS SEEN    Culture   Final    RARE Consistent with normal respiratory flora. Performed at [facility], [address] [place], [place] [address]    Report Status 05/03/2018 FINAL  Final    Coagulation Studies: No results for input(s): LABPROT, INR in the last 72 hours.  Urinalysis: No results for input(s): COLORURINE, LABSPEC, PHURINE, GLUCOSEU, HGBUR, BILIRUBINUR, KETONESUR, PROTEINUR, UROBILINOGEN, NITRITE, LEUKOCYTESUR in the last 72 hours.  Invalid input(s): APPERANCEUR    Imaging: Ct Abdomen Wo Contrast  Result Date: 05/12/2018 CLINICAL DATA:  Respiratory failure, aspiration pneumonia, dysphagia and evaluation for possible gastrostomy tube placement. EXAM: CT ABDOMEN WITHOUT CONTRAST TECHNIQUE: Multidetector CT imaging of the abdomen was performed following the standard protocol without IV contrast. COMPARISON:  Abdominal film on 04/30/2018 FINDINGS: Lower chest: Small right pleural effusion and bibasilar atelectasis. Significant cardiac enlargement. Hepatobiliary: Hepatomegaly with prominent left lobe of the liver crossing the midline and extending into the far lateral left  abdomen. Gallbladder appears contracted. No biliary ductal dilatation identified. Pancreas: Unremarkable by unenhanced CT. Spleen: Small spleen appears unremarkable by unenhanced CT. Adrenals/Urinary Tract: No obvious adrenal masses. Native kidneys are not visualized and have either been previously removed, are severely atrophic or pelvic in location. Stomach/Bowel: No hiatal hernia. A feeding/decompression tube extends into the stomach and terminates in the second portion of the  duodenum. Gastric anatomy is unusual in that the majority of the stomach is posterior to the left lobe of the liver. A small amount of the distal stomach does project inferior to the liver, but the distal stomach lies posterior to a segment of the transverse colon and some small bowel loops. Very high splenic flexure of the colon lies predominantly posterior to the stomach. Vascular/Lymphatic: Normal caliber abdominal aorta. No lymphadenopathy identified. Other: Diffuse anasarca and body wall edema present. Edema is present in the mesenteric fat without overt ascites. Musculoskeletal: No acute or significant osseous findings. IMPRESSION: 1. Abdominal visceral and bowel anatomy is not amenable to safe placement of a percutaneous gastrostomy tube. The liver is significantly enlarged with a large left lobe extending into the far left lateral abdomen. The majority of the stomach lies posterior to the liver. The distal stomach lies posterior to part of the transverse colon and also small bowel loops. Even with gastric insufflation, risk of liver injury or bowel perforation with attempted percutaneous gastrostomy tube placement would be quite high. 2. Diffuse anasarca and body wall edema. 3. Small right pleural effusion. 4. Lack of visualization of native kidneys reflecting either previous nephrectomy, severe renal atrophy or pelvic location of kidney(s). Electronically Signed   By: Irish LackGlenn  Yamagata M.D.   On: 05/12/2018 16:20     Medications:       Assessment/ Plan:  37 y.o. female with a PMHx of recent acute respiratory failure requiring intubation, aspiration pneumonia, cardiac arrest, chronic systolic heart failure, delay of cognitive development, end-stage renal disease on hemodialysis MWF, hypertension, seizure disorder, who was admitted to [facility] on 04/30/2018 for ongoing management of acute respiratory failure, recent PEA arrest, and end-stage renal disease.   1.  ESRD on HD.    Maintain the  patient on MWF dialysis schedule.  We will prepare orders for Friday.  2.  Acute respiratory failure.  Secondary to aspiration.   -Patient still on the ventilator.  Weaning as per Dr. Welton FlakesKhan.   3.  Anemia of chronic kidney disease.    Hemoglobin is remained stable over the past week.  Hemoglobin currently 9.1.  Continue to monitor.  4.  Secondary hyperparathyroidism.  Phosphorus within target range at 5.0.  Recheck on Friday.  5.  Hypertension: Blood pressure this a.m. was 144/97.  Continue current antihypertensive regimen.    LOS: 0 Lonetta Blassingame 8/14/201910:24 AM

## 2018-05-13 NOTE — Progress Notes (Signed)
Pulmonary Critical Care Medicine [facility]   PULMONARY SERVICE  PROGRESS NOTE  Date of Service: 05/13/2018  Lori NobleMegan C Gutierrez  ZOX:[id number]  DOB: 1980-10-31   DOA: 04/30/2018  Referring Physician: Carron CurieAli Hijazi, MD  HPI: Lori Gutierrez is a 37 y.o. female seen for follow up of Acute on Chronic Respiratory Failure.  Today she was weaning on pressure support mode and seems to be tolerating it well currently on 28% FiO2 with a pressure support of 12 and PEEP of 5  Medications: Reviewed on Rounds  Physical Exam:  Vitals: Temperature 97.0 pulse 83 respiratory rate 12 blood pressure 144/87 saturations 93%  Ventilator Settings mode of ventilation pressure support FiO2 28% tidal volume 436 per support 12 PEEP 5  . General: Comfortable at this time . Eyes: Grossly normal lids, irises & conjunctiva . ENT: grossly tongue is normal . Neck: no obvious mass . Cardiovascular: S1 S2 normal no gallop . Respiratory: No rhonchi or rales are noted at this time . Abdomen: soft . Skin: no rash seen on limited exam . Musculoskeletal: not rigid . Psychiatric:unable to assess . Neurologic: no seizure no involuntary movements         Lab Data:   Basic Metabolic Panel: Recent Labs  Lab 05/07/18 0457 05/09/18 0450 05/11/18 0451 05/13/18 0436  NA 131* 129* 128* 128*  K 3.7 4.4 3.8 4.0  CL 86* 90* 87* 89*  CO2 25 24 24 23   GLUCOSE 82 96 100* 119*  BUN 52* 51* 69* 73*  CREATININE 3.53* 2.95* 3.19* 3.01*  CALCIUM 8.2* 8.3* 8.7* 9.2  MG  --   --  2.4  --   PHOS 4.1 3.7 3.8 5.0*    Liver Function Tests: Recent Labs  Lab 05/07/18 0457 05/09/18 0450 05/11/18 0451 05/13/18 0436  ALBUMIN 2.5* 2.6* 2.7* 2.9*   No results for input(s): LIPASE, AMYLASE in the last 168 hours. No results for input(s): AMMONIA in the last 168 hours.  CBC: Recent Labs  Lab 05/07/18 0457 05/08/18 0644 05/09/18 0623 05/11/18 0451 05/12/18 1651 05/12/18 2221 05/13/18 0436  WBC 13.6*  11.2* 11.1* 14.6*  --   --  17.8*  HGB 7.3* 8.3* 8.9* 9.2* 9.5* 9.1* 9.1*  HCT 23.3* 25.4* 28.4* 29.7* 30.4* 29.5* 29.4*  MCV 86.3 86.7 89.6 90.3  --   --  90.5  PLT 325 297 373 379  --   --  374    Cardiac Enzymes: No results for input(s): CKTOTAL, CKMB, CKMBINDEX, TROPONINI in the last 168 hours.  BNP (last 3 results) No results for input(s): BNP in the last 8760 hours.  ProBNP (last 3 results) No results for input(s): PROBNP in the last 8760 hours.  Radiological Exams: Ct Abdomen Wo Contrast  Result Date: 05/12/2018 CLINICAL DATA:  Respiratory failure, aspiration pneumonia, dysphagia and evaluation for possible gastrostomy tube placement. EXAM: CT ABDOMEN WITHOUT CONTRAST TECHNIQUE: Multidetector CT imaging of the abdomen was performed following the standard protocol without IV contrast. COMPARISON:  Abdominal film on 04/30/2018 FINDINGS: Lower chest: Small right pleural effusion and bibasilar atelectasis. Significant cardiac enlargement. Hepatobiliary: Hepatomegaly with prominent left lobe of the liver crossing the midline and extending into the far lateral left abdomen. Gallbladder appears contracted. No biliary ductal dilatation identified. Pancreas: Unremarkable by unenhanced CT. Spleen: Small spleen appears unremarkable by unenhanced CT. Adrenals/Urinary Tract: No obvious adrenal masses. Native kidneys are not visualized and have either been previously removed, are severely atrophic or pelvic in location. Stomach/Bowel: No hiatal hernia.  A feeding/decompression tube extends into the stomach and terminates in the second portion of the duodenum. Gastric anatomy is unusual in that the majority of the stomach is posterior to the left lobe of the liver. A small amount of the distal stomach does project inferior to the liver, but the distal stomach lies posterior to a segment of the transverse colon and some small bowel loops. Very high splenic flexure of the colon lies predominantly posterior  to the stomach. Vascular/Lymphatic: Normal caliber abdominal aorta. No lymphadenopathy identified. Other: Diffuse anasarca and body wall edema present. Edema is present in the mesenteric fat without overt ascites. Musculoskeletal: No acute or significant osseous findings. IMPRESSION: 1. Abdominal visceral and bowel anatomy is not amenable to safe placement of a percutaneous gastrostomy tube. The liver is significantly enlarged with a large left lobe extending into the far left lateral abdomen. The majority of the stomach lies posterior to the liver. The distal stomach lies posterior to part of the transverse colon and also small bowel loops. Even with gastric insufflation, risk of liver injury or bowel perforation with attempted percutaneous gastrostomy tube placement would be quite high. 2. Diffuse anasarca and body wall edema. 3. Small right pleural effusion. 4. Lack of visualization of native kidneys reflecting either previous nephrectomy, severe renal atrophy or pelvic location of kidney(s). Electronically Signed   By: Irish LackGlenn  Yamagata M.D.   On: 05/12/2018 16:20    Assessment/Plan Principal Problem:   Acute on chronic respiratory failure with hypoxia (HCC) Active Problems:   Aspiration pneumonia (HCC)   Seizure disorder (HCC)   Cardiac arrest (HCC)   End stage renal disease on dialysis (HCC)   Chronic systolic heart failure (HCC)   1. Acute on chronic respiratory failure with hypoxia we will continue weaning on pressure support as tolerated 2. Pneumonia due to aspiration treated continue present management. 3. Seizure disorder no active seizures noted. 4. Cardiac arrest stable we will monitor 5. End-stage renal disease on dialysis 6. Chronic systolic heart failure compensated at this time   I have personally seen and evaluated the patient, evaluated laboratory and imaging results, formulated the assessment and plan and placed orders. The Patient requires high complexity decision making for  assessment and support.  Case was discussed on Rounds with the Respiratory Therapy Staff  Yevonne PaxSaadat A Wynnie Pacetti, MD [facility] Pulmonary Critical Care Medicine Sleep Medicine

## 2018-05-13 NOTE — Consult Note (Addendum)
Hospital Consult    Reason for Consult:  Persistent L arm edema Requesting Physician:  Lilian Kapurriya, NP MRN #:  [id number]  History of Present Illness: This is a 37 y.o. female with cognitive deficit, acute respiratory failure now full support on trach, aspiration pneumonia, recent cardiac arrest, CHF with 25% EF, ESRD on HD via L IJ TDC seen in consultation due to persistent edema of LUE.  No family or friends present and unable to obtain history from patient.  Based on chart review patient is believed to have had an infiltrated IV site in L arm several weeks ago while admitted [facility].  Edema persisted and she was treated for a cellulitis of L arm with IV Zosyn.  Duplex in [place] negative for DVT, findings consistent with hematoma.  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  She is in wrist restraints to keep from disconnecting tubing to tracheostomy.  She has pain to touch in L arm so exam is limited.  Primary service also concerned about infectious source with rising white count.  Past Medical History:  Diagnosis Date  . Acute on chronic respiratory failure with hypoxia (HCC)   . Aspiration pneumonia (HCC)   . Cardiac arrest (HCC)   . Chronic systolic heart failure (HCC)   . Delay of cognitive development   . End stage renal disease on dialysis (HCC)   . Hypertension   . Seizure disorder [facility](HCC)     Past Surgical History:  Procedure Laterality Date  . CLOSED REDUCTION PROXIMAL ULNAR FRACTURE    . TRACHEOSTOMY TUBE PLACEMENT N/A 05/08/2018   Procedure: TRACHEOSTOMY;  Surgeon: Drema HalonNewman, Christopher E, MD;  [facility];  Service: General;  Laterality: N/A;    Not on File  Prior to Admission medications   Not on File    Social History   Socioeconomic History  . Marital status: Single    Spouse name: Not on file  . Number of children: Not  on file  . Years of education: Not on file  . Highest education level: Not on file  Occupational History  . Not on file  Social Needs  . Financial resource strain: Not on file  . Food insecurity:    Worry: Not on file    Inability: Not on file  . Transportation needs:    Medical: Not on file    Non-medical: Not on file  Tobacco Use  . Smoking status: Never Smoker  . Smokeless tobacco: Never Used  Substance and Sexual Activity  . Alcohol use: Not Currently  . Drug use: Not Currently  . Sexual activity: Not Currently  Lifestyle  . Physical activity:    Days per week: Not on file    Minutes per session: Not on file  . Stress: Not on file  Relationships  . Social connections:    Talks on phone: Not on file    Gets together: Not on file    Attends religious service: Not on file    Active member of club or organization: Not on file    Attends meetings of clubs or organizations: Not on file    Relationship status: Not on file  . Intimate partner violence:    Fear of current or ex partner: Not on file    Emotionally abused: Not on file    Physically abused: Not on file    Forced  sexual activity: Not on file  Other Topics Concern  . Not on file  Social History Narrative  . Not on file     Family History  Family history unknown: Yes    ROS: Otherwise negative unless mentioned in HPI  Physical Examination  There were no vitals filed for this visit. There is no height or weight on file to calculate BMI.  General:  Critically ill Gait: Not observed Pulmonary: ventilator Abdomen: soft, NT/ND, no masses Skin: without rashes Vascular Exam/Pulses: R wrist splint; palpable L radial Extremities:  Sensation intact L hand; grip strength intact L hand; pitting edema L forearm, more prominent L upper arm; no erythema or other sign of infection; pain to light palpation L upper arm unable to appreciate pulsatility of hematoma  Musculoskeletal: no muscle wasting or  atrophy  Neurologic: sedated Lymph:  Unremarkable  CBC    Component Value Date/Time   WBC 17.8 (H) 05/13/2018 0436   RBC 3.25 (L) 05/13/2018 0436   HGB 9.1 (L) 05/13/2018 0436   HCT 29.4 (L) 05/13/2018 0436   PLT 374 05/13/2018 0436   MCV 90.5 05/13/2018 0436   MCH 28.0 05/13/2018 0436   MCHC 31.0 05/13/2018 0436   RDW 19.5 (H) 05/13/2018 0436   LYMPHSABS 0.7 05/01/2018 0506   MONOABS 1.1 (H) 05/01/2018 0506   EOSABS 0.5 05/01/2018 0506   BASOSABS 0.1 05/01/2018 0506    BMET    Component Value Date/Time   NA 128 (L) 05/13/2018 0436   K 4.0 05/13/2018 0436   CL 89 (L) 05/13/2018 0436   CO2 23 05/13/2018 0436   GLUCOSE 119 (H) 05/13/2018 0436   BUN 73 (H) 05/13/2018 0436   CREATININE 3.01 (H) 05/13/2018 0436   CALCIUM 9.2 05/13/2018 0436   CALCIUM 7.2 (L) 05/01/2018 2346   GFRNONAA 19 (L) 05/13/2018 0436   GFRAA 22 (L) 05/13/2018 0436    COAGS: Lab Results  Component Value Date   INR 1.59 05/01/2018     Non-Invasive Vascular Imaging:    LUE venous duplex: 1.  Negative for DVT 2.  large, vascularized heterogenous area extending from the antecubital fossa to the mid upper arm, with an anechoic area at the antecubital fossa. The heterogenous area is suggestive of a possible hematoma. The anechoic area is extending from the distal brachial artery area, with a visible neck containing to-fro flow. This is suggestive of a pseudoaneurysm.   ASSESSMENT/PLAN: This is a 37 y.o. female with persistent LUE edema/hematoma with concern for possible pseudoaneurysm  - Critically ill patient with multiple medical conditions currently in [facility] and fully supported by ventilator via tracheostomy.  Venous duplex suggesting possible pseudoaneurysm off of distal brachial artery.  Does not appear to be infected so this would not explain rising white count.  Unable to appreciate any pulsatility in distal upper arm on exam however exam limited due to pain.  Would recommend  elevation of arm and possibly a light ace wrap however this is difficult given her need for restraints.  Given patient's condition we would be reluctant to proceed with any invasive treatment plan.  Dr. Arbie CookeyEarly will evaluate the patient and provide further recommendations.   Emilie RutterMatthew Eveland PA-C [facility] [phone]   I have examined the patient, reviewed and agree with above.  Difficult to determine whether the hematoma in her left arm is increasing in size.  Was noted to be present [facility].  On physical exam she does not have  any evidence of threatened skin loss over the hematoma.  Does have a large hematoma in her upper arm.  Does not have any expansile nature to her hematoma to suggest false aneurysm.  I did review her actual films from her duplex from yesterday.  This does show a large hematoma and her duplex does strongly suggest that there is a false aneurysm off her brachial artery with a flow in and out of a portion of the hematoma.  I have discussed this [facility]  Recommend CT angiogram of her left arm for further evaluation.  If she indeed does have flow into this hematoma with a false aneurysm will require eventual evacuation and repair of her brachial artery.  We will follow with you.  Gretta Began, MD 05/13/2018 4:44 PM

## 2018-05-14 DIAGNOSIS — I5022 Chronic systolic (congestive) heart failure: Secondary | ICD-10-CM | POA: Diagnosis not present

## 2018-05-14 DIAGNOSIS — J690 Pneumonitis due to inhalation of food and vomit: Secondary | ICD-10-CM | POA: Diagnosis not present

## 2018-05-14 DIAGNOSIS — J9621 Acute and chronic respiratory failure with hypoxia: Secondary | ICD-10-CM | POA: Diagnosis not present

## 2018-05-14 DIAGNOSIS — I469 Cardiac arrest, cause unspecified: Secondary | ICD-10-CM | POA: Diagnosis not present

## 2018-05-14 NOTE — Progress Notes (Signed)
[facility]   PULMONARY SERVICE  PROGRESS NOTE  Date of Service: 05/14/2018  Asencion NobleMegan C Feliz  MVH:[id number]  DOB: [birth year]   DOA: 04/30/2018  Referring Physician: Carron CurieAli Hijazi, MD  HPI: Asencion NobleMegan C Sloss is a 37 y.o. female seen for follow up of Acute on Chronic Respiratory Failure.  Patient is on pressure support mode right now has been on 40% oxygen.  She is doing a little bit better than goal is for 16 hours  Medications: Reviewed on Rounds  Physical Exam:  Vitals: Temperature 98.7 pulse 77 respiratory rate 21 blood pressure is 102/67  Ventilator Settings mode of ventilation pressure support FiO2 40% tidal volume 430 pressure support 12 PEEP 5  . General: Comfortable at this time . Eyes: Grossly normal lids, irises & conjunctiva . ENT: grossly tongue is normal . Neck: no obvious mass . Cardiovascular: S1 S2 normal no gallop . Respiratory: Coarse rhonchi expansion equal . Abdomen: soft . Skin: no rash seen on limited exam . Musculoskeletal: not rigid . Psychiatric:unable to assess . Neurologic: no seizure no involuntary movements         Lab Data:   Basic Metabolic Panel: Recent Labs  Lab 05/09/18 0450 05/11/18 0451 05/13/18 0436  NA 129* 128* 128*  K 4.4 3.8 4.0  CL 90* 87* 89*  CO2 24 24 23   GLUCOSE 96 100* 119*  BUN 51* 69* 73*  CREATININE 2.95* 3.19* 3.01*  CALCIUM 8.3* 8.7* 9.2  MG  --  2.4  --   PHOS 3.7 3.8 5.0*    Liver Function Tests: Recent Labs  Lab 05/09/18 0450 05/11/18 0451 05/13/18 0436  ALBUMIN 2.6* 2.7* 2.9*   No results for input(s): LIPASE, AMYLASE in the last 168 hours. No results for input(s): AMMONIA in the last 168 hours.  CBC: Recent Labs  Lab 05/08/18 0644 05/09/18 0623 05/11/18 0451 05/12/18 1651 05/12/18 2221 05/13/18 0436  WBC 11.2* 11.1* 14.6*  --   --  17.8*  HGB 8.3* 8.9* 9.2* 9.5* 9.1* 9.1*  HCT 25.4* 28.4* 29.7* 30.4* 29.5* 29.4*  MCV 86.7 89.6 90.3  --   --   90.5  PLT 297 373 379  --   --  374    Cardiac Enzymes: No results for input(s): CKTOTAL, CKMB, CKMBINDEX, TROPONINI in the last 168 hours.  BNP (last 3 results) No results for input(s): BNP in the last 8760 hours.  ProBNP (last 3 results) No results for input(s): PROBNP in the last 8760 hours.  Radiological Exams: Ct Abdomen Wo Contrast  Result Date: 05/12/2018 CLINICAL DATA:  Respiratory failure, aspiration pneumonia, dysphagia and evaluation for possible gastrostomy tube placement. EXAM: CT ABDOMEN WITHOUT CONTRAST TECHNIQUE: Multidetector CT imaging of the abdomen was performed following the standard protocol without IV contrast. COMPARISON:  Abdominal film on 04/30/2018 FINDINGS: Lower chest: Small right pleural effusion and bibasilar atelectasis. Significant cardiac enlargement. Hepatobiliary: Hepatomegaly with prominent left lobe of the liver crossing the midline and extending into the far lateral left abdomen. Gallbladder appears contracted. No biliary ductal dilatation identified. Pancreas: Unremarkable by unenhanced CT. Spleen: Small spleen appears unremarkable by unenhanced CT. Adrenals/Urinary Tract: No obvious adrenal masses. Native kidneys are not visualized and have either been previously removed, are severely atrophic or pelvic in location. Stomach/Bowel: No hiatal hernia. A feeding/decompression tube extends into the stomach and terminates in the second portion of the duodenum. Gastric anatomy is unusual in that the majority of the stomach is posterior to the left  lobe of the liver. A small amount of the distal stomach does project inferior to the liver, but the distal stomach lies posterior to a segment of the transverse colon and some small bowel loops. Very high splenic flexure of the colon lies predominantly posterior to the stomach. Vascular/Lymphatic: Normal caliber abdominal aorta. No lymphadenopathy identified. Other: Diffuse anasarca and body wall edema present. Edema is  present in the mesenteric fat without overt ascites. Musculoskeletal: No acute or significant osseous findings. IMPRESSION: 1. Abdominal visceral and bowel anatomy is not amenable to safe placement of a percutaneous gastrostomy tube. The liver is significantly enlarged with a large left lobe extending into the far left lateral abdomen. The majority of the stomach lies posterior to the liver. The distal stomach lies posterior to part of the transverse colon and also small bowel loops. Even with gastric insufflation, risk of liver injury or bowel perforation with attempted percutaneous gastrostomy tube placement would be quite high. 2. Diffuse anasarca and body wall edema. 3. Small right pleural effusion. 4. Lack of visualization of native kidneys reflecting either previous nephrectomy, severe renal atrophy or pelvic location of kidney(s). Electronically Signed   By: Irish LackGlenn  Yamagata M.D.   On: 05/12/2018 16:20    Assessment/Plan Principal Problem:   Acute on chronic respiratory failure with hypoxia (HCC) Active Problems:   Aspiration pneumonia (HCC)   Seizure disorder (HCC)   Cardiac arrest (HCC)   End stage renal disease on dialysis (HCC)   Chronic systolic heart failure (HCC)   1. Acute on chronic respiratory failure with hypoxia continue with weaning on pressure support as mentioned the goal is for 16 hours 2. Pneumonia due to aspiration treated we will continue to follow 3. Seizure disorder she is awake no active seizures 4. End-stage renal disease on dialysis followed by nephrology 5. Chronic systolic heart failure follow fluid status closely   I have personally seen and evaluated the patient, evaluated laboratory and imaging results, formulated the assessment and plan and placed orders. The Patient requires high complexity decision making for assessment and support.  Case was discussed on Rounds with the Respiratory Therapy Staff  Yevonne PaxSaadat A Shawntelle Ungar, MD [facility] Pulmonary Critical Care  Medicine Sleep Medicine

## 2018-05-15 ENCOUNTER — Other Ambulatory Visit (HOSPITAL_COMMUNITY): Payer: Self-pay

## 2018-05-15 DIAGNOSIS — J9621 Acute and chronic respiratory failure with hypoxia: Secondary | ICD-10-CM | POA: Diagnosis not present

## 2018-05-15 DIAGNOSIS — J690 Pneumonitis due to inhalation of food and vomit: Secondary | ICD-10-CM | POA: Diagnosis not present

## 2018-05-15 DIAGNOSIS — I469 Cardiac arrest, cause unspecified: Secondary | ICD-10-CM | POA: Diagnosis not present

## 2018-05-15 DIAGNOSIS — I5022 Chronic systolic (congestive) heart failure: Secondary | ICD-10-CM | POA: Diagnosis not present

## 2018-05-15 LAB — CBC
HCT: 27.1 % — ABNORMAL LOW (ref 36.0–46.0)
Hemoglobin: 8.5 g/dL — ABNORMAL LOW (ref 12.0–15.0)
MCH: 28.2 pg (ref 26.0–34.0)
MCHC: 31.4 g/dL (ref 30.0–36.0)
MCV: 90 fL (ref 78.0–100.0)
Platelets: 337 10*3/uL (ref 150–400)
RBC: 3.01 MIL/uL — ABNORMAL LOW (ref 3.87–5.11)
RDW: 19.6 % — ABNORMAL HIGH (ref 11.5–15.5)
WBC: 16.6 10*3/uL — ABNORMAL HIGH (ref 4.0–10.5)

## 2018-05-15 LAB — RENAL FUNCTION PANEL
ANION GAP: 12 (ref 5–15)
Albumin: 2.7 g/dL — ABNORMAL LOW (ref 3.5–5.0)
BUN: 70 mg/dL — ABNORMAL HIGH (ref 6–20)
CALCIUM: 9 mg/dL (ref 8.9–10.3)
CO2: 25 mmol/L (ref 22–32)
Chloride: 92 mmol/L — ABNORMAL LOW (ref 98–111)
Creatinine, Ser: 2.74 mg/dL — ABNORMAL HIGH (ref 0.44–1.00)
GFR calc Af Amer: 25 mL/min — ABNORMAL LOW (ref >60)
GFR calc non Af Amer: 21 mL/min — ABNORMAL LOW (ref >60)
GLUCOSE: 136 mg/dL — AB (ref 70–99)
Phosphorus: 3.8 mg/dL (ref 2.5–4.6)
Potassium: 4.1 mmol/L (ref 3.5–5.1)
SODIUM: 129 mmol/L — AB (ref 135–145)

## 2018-05-15 LAB — CULTURE, RESPIRATORY

## 2018-05-15 LAB — CULTURE, RESPIRATORY W GRAM STAIN: Culture: NORMAL

## 2018-05-15 MED ORDER — IOPAMIDOL (ISOVUE-370) INJECTION 76%
INTRAVENOUS | Status: AC
  Filled 2018-05-15: qty 100

## 2018-05-15 NOTE — Progress Notes (Signed)
[facility]  ROUNDING NOTE   Subjective:  Patient underwent hemodialysis today. Ultrafiltration achieved was 2.1 kg. Remains on the ventilator at this time.   Objective:  Vital signs in last 24 hours:  Temperature 97.4 pulse 98 respirations 26 blood pressure 180/104  Physical Exam: General: Critically ill appearing  Head: [place]/AT NG in place  Eyes: Anicteric  Neck: Tracheostomy in place  Lungs:  Scattered rhonchi, vent assisted  Heart: S1S2 no rubs  Abdomen:  Soft, nontender, bowel sounds present  Extremities: trace peripheral edema.  Neurologic: Awake but not following commands  Skin: No lesions  Access: L IJ permcath    Basic Metabolic Panel: Recent Labs  Lab 05/09/18 0450 05/11/18 0451 05/13/18 0436 05/15/18 0610  NA 129* 128* 128* 129*  K 4.4 3.8 4.0 4.1  CL 90* 87* 89* 92*  CO2 24 24 23 25   GLUCOSE 96 100* 119* 136*  BUN 51* 69* 73* 70*  CREATININE 2.95* 3.19* 3.01* 2.74*  CALCIUM 8.3* 8.7* 9.2 9.0  MG  --  2.4  --   --   PHOS 3.7 3.8 5.0* 3.8    Liver Function Tests: Recent Labs  Lab 05/09/18 0450 05/11/18 0451 05/13/18 0436 05/15/18 0610  ALBUMIN 2.6* 2.7* 2.9* 2.7*   No results for input(s): LIPASE, AMYLASE in the last 168 hours. No results for input(s): AMMONIA in the last 168 hours.  CBC: Recent Labs  Lab 05/09/18 0623 05/11/18 0451 05/12/18 1651 05/12/18 2221 05/13/18 0436 05/15/18 0610  WBC 11.1* 14.6*  --   --  17.8* 16.6*  HGB 8.9* 9.2* 9.5* 9.1* 9.1* 8.5*  HCT 28.4* 29.7* 30.4* 29.5* 29.4* 27.1*  MCV 89.6 90.3  --   --  90.5 90.0  PLT 373 379  --   --  374 337    Cardiac Enzymes: No results for input(s): CKTOTAL, CKMB, CKMBINDEX, TROPONINI in the last 168 hours.  BNP: Invalid input(s): POCBNP  CBG: No results for input(s): GLUCAP in the last 168 hours.  Microbiology: Results for orders placed or performed during the hospital encounter of 04/30/18  Culture, respiratory (non-expectorated)     Status: None   Collection Time: 05/01/18  7:52 AM  Result Value Ref Range Status   Specimen Description TRACHEAL ASPIRATE  Final   Special Requests NONE  Final   Gram Stain   Final    ABUNDANT WBC PRESENT,BOTH PMN AND MONONUCLEAR NO ORGANISMS SEEN    Culture   Final    RARE Consistent with normal respiratory flora. Performed at [facility], [address] [place], [place] [address]    Report Status 05/03/2018 FINAL  Final  Culture, respiratory (non-expectorated)     Status: None   Collection Time: 05/13/18  1:50 PM  Result Value Ref Range Status   Specimen Description TRACHEAL ASPIRATE  Final   Special Requests NONE  Final   Gram Stain   Final    ABUNDANT WBC PRESENT, PREDOMINANTLY PMN RARE GRAM POSITIVE COCCI    Culture   Final    RARE Consistent with normal respiratory flora. Performed at [facility], [address] [place], [place] [address]    Report Status 05/15/2018 FINAL  Final    Coagulation Studies: No results for input(s): LABPROT, INR in the last 72 hours.  Urinalysis: Recent Labs    05/13/18 2032  COLORURINE YELLOW  LABSPEC 1.011  PHURINE 7.0  GLUCOSEU NEGATIVE  HGBUR MODERATE*  BILIRUBINUR NEGATIVE  KETONESUR NEGATIVE  PROTEINUR >=300*  NITRITE NEGATIVE  LEUKOCYTESUR NEGATIVE      Imaging: No results found.   Medications:       Assessment/ Plan:  37 y.o. female with a PMHx of recent acute respiratory failure requiring intubation, aspiration pneumonia, cardiac arrest, chronic systolic heart failure, delay of cognitive development, end-stage renal disease on hemodialysis MWF, hypertension, seizure disorder, who was admitted to [facility] on 04/30/2018 for ongoing management of acute respiratory failure, recent PEA arrest, and end-stage renal disease.   1.  ESRD on HD.    Patient completed hemodialysis today ultrafiltration achieved was 2.1 kg.  Next dialysis treatment on Monday.  2.  Acute respiratory failure.  Secondary to aspiration.    -Continue ventilatory support at this time.  Weaning as per pulmonary/critical care.  3.  Anemia of chronic kidney disease.  Hemoglobin currently 8.5.  Recheck this next week.  4.  Secondary hyperparathyroidism.  Phosphorus down to 3.8 and at target.  Recheck on Monday.  5.  Hypertension: Patient continues to have labile blood pressure.  Continue the patient on amlodipine, clonidine, hydralazine, lisinopril, and metoprolol.  She also receives as needed hydralazine intravenous.    LOS: 0 Cidney Kirkwood 8/16/20196:34 PM

## 2018-05-15 NOTE — Progress Notes (Signed)
Patient ID: Lori NobleMegan C Gutierrez, female   DOB: Jan 29, 1981, 37 y.o.   MRN: [id number] Left arm stable.  No change in hematoma. Awaiting CT angiogram. No acute issues.  If she has a pulse aneurysm, will discuss possible benefit of evacuation and ligation of false aneurysm

## 2018-05-15 NOTE — Progress Notes (Signed)
Pulmonary Critical Care Medicine [facility]   PULMONARY SERVICE  PROGRESS NOTE  Date of Service: 05/15/2018  Lori NobleMegan C Gutierrez  ZOX:[id number]:[id number]  DOB: [birth year]   DOA: 04/30/2018  Referring Physician: Carron CurieAli Hijazi, MD  HPI: Lori Gutierrez is a 37 y.o. female seen for follow up of Acute on Chronic Respiratory Failure.  Currently on T collar has been on 40% oxygen.  Patient was able to do about 3 hours  Medications: Reviewed on Rounds  Physical Exam:  Vitals: Temperature 97.4 pulse 98 respiratory rate 26 blood pressure 180/100 saturations 98%  Ventilator Settings currently is on aerosolized T collar FiO2 40%  . General: Comfortable at this time . Eyes: Grossly normal lids, irises & conjunctiva . ENT: grossly tongue is normal . Neck: no obvious mass . Cardiovascular: S1 S2 normal no gallop . Respiratory: No rhonchi no rales are noted . Abdomen: soft . Skin: no rash seen on limited exam . Musculoskeletal: not rigid . Psychiatric:unable to assess . Neurologic: no seizure no involuntary movements         Lab Data:   Basic Metabolic Panel: Recent Labs  Lab 05/09/18 0450 05/11/18 0451 05/13/18 0436 05/15/18 0610  NA 129* 128* 128* 129*  K 4.4 3.8 4.0 4.1  CL 90* 87* 89* 92*  CO2 24 24 23 25   GLUCOSE 96 100* 119* 136*  BUN 51* 69* 73* 70*  CREATININE 2.95* 3.19* 3.01* 2.74*  CALCIUM 8.3* 8.7* 9.2 9.0  MG  --  2.4  --   --   PHOS 3.7 3.8 5.0* 3.8    Liver Function Tests: Recent Labs  Lab 05/09/18 0450 05/11/18 0451 05/13/18 0436 05/15/18 0610  ALBUMIN 2.6* 2.7* 2.9* 2.7*   No results for input(s): LIPASE, AMYLASE in the last 168 hours. No results for input(s): AMMONIA in the last 168 hours.  CBC: Recent Labs  Lab 05/09/18 0623 05/11/18 0451 05/12/18 1651 05/12/18 2221 05/13/18 0436 05/15/18 0610  WBC 11.1* 14.6*  --   --  17.8* 16.6*  HGB 8.9* 9.2* 9.5* 9.1* 9.1* 8.5*  HCT 28.4* 29.7* 30.4* 29.5* 29.4* 27.1*  MCV 89.6 90.3  --   --   90.5 90.0  PLT 373 379  --   --  374 337    Cardiac Enzymes: No results for input(s): CKTOTAL, CKMB, CKMBINDEX, TROPONINI in the last 168 hours.  BNP (last 3 results) No results for input(s): BNP in the last 8760 hours.  ProBNP (last 3 results) No results for input(s): PROBNP in the last 8760 hours.  Radiological Exams: No results found.  Assessment/Plan Principal Problem:   Acute on chronic respiratory failure with hypoxia (HCC) Active Problems:   Aspiration pneumonia (HCC)   Seizure disorder (HCC)   Cardiac arrest (HCC)   End stage renal disease on dialysis (HCC)   Chronic systolic heart failure (HCC)   1. Acute on chronic respiratory failure with hypoxia continue to wean and advance as tolerated tomorrow goal should be 6 hours. 2. Pneumonia due to aspiration clinically improving we will continue to monitor 3. Seizure disorder at baseline we will continue with supportive care no active seizures are noted. 4. Cardiac arrest patient is got normal stable rhythm right now 5. End-stage renal disease on dialysis followed by nephrology 6. Chronic systolic heart failure continue with supportive care   I have personally seen and evaluated the patient, evaluated laboratory and imaging results, formulated the assessment and plan and placed orders. The Patient requires high complexity decision making  for assessment and support.  Case was discussed on Rounds with the Respiratory Therapy Staff  Allyne Gee, MD [facility] Pulmonary Critical Care Medicine Sleep Medicine

## 2018-05-16 ENCOUNTER — Other Ambulatory Visit (HOSPITAL_COMMUNITY): Payer: Self-pay

## 2018-05-16 MED ORDER — IOPAMIDOL (ISOVUE-370) INJECTION 76%
100.0000 mL | Freq: Once | INTRAVENOUS | Status: AC | PRN
  Administered 2018-05-16: 100 mL via INTRAVENOUS

## 2018-05-16 NOTE — Progress Notes (Addendum)
[facility] [place]  Subjective  - Left arm hematoma.  Patient alert and nods yes no answers.  Does not speak aloud.  Patient is believed to have had an infiltrated IV site in L arm several weeks ago [facility].  Edema persisted and she was treated for a cellulitis of L arm with IV Zosyn.  Duplex in [place] negative for DVT, findings consistent with hematoma.    Objective           No intake or output data in the 24 hours ending 05/16/18 1312  Left UE AC hematoma/ edema markings on skin indicate unchanged. Skin is in tact without damage.  Area is firm to palpation, but not tender per patients response.   Palpable radial pulse distally, motor intact, as well as sensation.    CTA: LUE Left upper extremity vasculature: Normal appearance of left axillary artery without aneurysm or dissection. The left brachial artery is patent but there is narrowing of the brachial artery in the distal humerus region which appears to be associated with a soft tissue hematoma. There is some motion artifact and streak artifact in the distal humeral region but no evidence for a significant aneurysm or active contrast extravasation. Hyperdense material around the distal brachial artery is probably associated with the hematoma. Difficult to assess for a dissection due to the imaging limitations. Images of the forearm vessels are limited. Radial artery is patent to the wrist. Ulnar artery is not well demonstrated on this examination.  IMPRESSION: Enlargement of the left biceps musculature compatible with history of a hematoma. There is a 2.5 cm hyperdense area around the left brachial vein which could be related to the hematoma but difficult to exclude a pseudoaneurysm in this area. This area could be further characterized with a vascular ultrasound.  Assessment/Planning: Left UE pseudoaneurysm   The left arm appears stable and unchanged. I spoke  with the physicians who are following this patient and they stated it is stable.  No skin changes and no changes in motor, palpable pulse or sensation.    Continued observation for now.    Mosetta Pigeonmma Maureen Collins 05/16/2018 1:12 PM --  Laboratory Lab Results: Recent Labs    05/15/18 0610  WBC 16.6*  HGB 8.5*  HCT 27.1*  PLT 337   BMET Recent Labs    05/15/18 0610  NA 129*  K 4.1  CL 92*  CO2 25  GLUCOSE 136*  BUN 70*  CREATININE 2.74*  CALCIUM 9.0    COAG Lab Results  Component Value Date   INR 1.59 05/01/2018   No results found for: PTT   I have seen and evaluated the patient. I agree with the PA note as documented above. Reviewed CTA.  Arm is stable.  Cephus Shellinghristopher J. Kele Barthelemy, MD [facility] [place] Office: [phone] Pager: [phone]

## 2018-05-17 DIAGNOSIS — J690 Pneumonitis due to inhalation of food and vomit: Secondary | ICD-10-CM | POA: Diagnosis not present

## 2018-05-17 DIAGNOSIS — I469 Cardiac arrest, cause unspecified: Secondary | ICD-10-CM | POA: Diagnosis not present

## 2018-05-17 DIAGNOSIS — I5022 Chronic systolic (congestive) heart failure: Secondary | ICD-10-CM | POA: Diagnosis not present

## 2018-05-17 DIAGNOSIS — J9621 Acute and chronic respiratory failure with hypoxia: Secondary | ICD-10-CM | POA: Diagnosis not present

## 2018-05-17 NOTE — Progress Notes (Signed)
Pulmonary Critical [facility]   PULMONARY SERVICE  PROGRESS NOTE  Date of Service: 05/17/2018  Lori Gutierrez  ZHY:[id number]  DOB: 12-12-1980   DOA: 04/30/2018  Referring Physician: Carron CurieAli Hijazi, MD  HPI: Lori Gutierrez is a 37 y.o. female seen for follow up of Acute on Chronic Respiratory Failure.  Patient right now is on T collar has been on 40% oxygen the goal is for 12-hour weaning today  Medications: Reviewed on Rounds  Physical Exam:  Vitals: Temperature 97.2 pulse 62 respiratory rate 17 blood pressure 149/88 saturation 95%  Ventilator Settings off the ventilator on T collar  . General: Comfortable at this time . Eyes: Grossly normal lids, irises & conjunctiva . ENT: grossly tongue is normal . Neck: no obvious mass . Cardiovascular: S1 S2 normal no gallop . Respiratory: Coarse breath sounds no rhonchi . Abdomen: soft . Skin: no rash seen on limited exam . Musculoskeletal: not rigid . Psychiatric:unable to assess . Neurologic: no seizure no involuntary movements         Lab Data:   Basic Metabolic Panel: Recent Labs  Lab 05/11/18 0451 05/13/18 0436 05/15/18 0610  NA 128* 128* 129*  K 3.8 4.0 4.1  CL 87* 89* 92*  CO2 24 23 25   GLUCOSE 100* 119* 136*  BUN 69* 73* 70*  CREATININE 3.19* 3.01* 2.74*  CALCIUM 8.7* 9.2 9.0  MG 2.4  --   --   PHOS 3.8 5.0* 3.8    Liver Function Tests: Recent Labs  Lab 05/11/18 0451 05/13/18 0436 05/15/18 0610  ALBUMIN 2.7* 2.9* 2.7*   No results for input(s): LIPASE, AMYLASE in the last 168 hours. No results for input(s): AMMONIA in the last 168 hours.  CBC: Recent Labs  Lab 05/11/18 0451 05/12/18 1651 05/12/18 2221 05/13/18 0436 05/15/18 0610  WBC 14.6*  --   --  17.8* 16.6*  HGB 9.2* 9.5* 9.1* 9.1* 8.5*  HCT 29.7* 30.4* 29.5* 29.4* 27.1*  MCV 90.3  --   --  90.5 90.0  PLT 379  --   --  374 337    Cardiac Enzymes: No results for input(s): CKTOTAL, CKMB, CKMBINDEX,  TROPONINI in the last 168 hours.  BNP (last 3 results) No results for input(s): BNP in the last 8760 hours.  ProBNP (last 3 results) No results for input(s): PROBNP in the last 8760 hours.  Radiological Exams: Ct Angio Up Extrem Left W &/or Wo Contast  Result Date: 05/16/2018 CLINICAL DATA:  37 year old with complex medical history including respiratory failure, cardiac arrest and end-stage renal disease with hemodialysis. Patient has a hematoma and evaluate for aneurysm. EXAM: CT ANGIOGRAPHY OF THE LEFT UPPEREXTREMITY TECHNIQUE: Multidetector CT imaging of the left upper extremitywas performed using the standard protocol during bolus administration of intravenous contrast. Multiplanar CT image reconstructions and MIPs were obtained to evaluate the vascular anatomy. CONTRAST:  100mL ISOVUE-370 IOPAMIDOL (ISOVUE-370) INJECTION 76% COMPARISON:  Abdominal CT 05/12/2018 FINDINGS: Aorta and great vessels: The visualized thoracic aorta is patent with normal caliber and negative for dissection. Proximal great vessels are patent. Left common carotid artery is widely patent. Left subclavian artery and left vertebral artery are patent. Left upper extremity vasculature: Normal appearance of left axillary artery without aneurysm or dissection. The left brachial artery is patent but there is narrowing of the brachial artery in the distal humerus region which appears to be associated with a soft tissue hematoma. There is some motion artifact and streak artifact in the distal  humeral region but no evidence for a significant aneurysm or active contrast extravasation. Hyperdense material around the distal brachial artery is probably associated with the hematoma. Difficult to assess for a dissection due to the imaging limitations. Images of the forearm vessels are limited. Radial artery is patent to the wrist. Ulnar artery is not well demonstrated on this examination. Musculoskeletal and structures: Subcutaneous edema  throughout the left upper extremity. There is high-density material in the soft tissues around the distal brachial artery and compatible with history of hematoma. This area measures up to 2.5 cm. In general, there appears to be enlargement of the left biceps which is also compatible with history of hematoma. No gross bone abnormality in the left upper extremity. Multiple fractures involving left anterior ribs of uncertain age. Some of these ribs could be acute and question recent CPR. Incidentally image of chest and abdominal structures: Heart appears to be a large. Nasogastric tube extending into the stomach and appears to go into the proximal duodenum region. There appears to be trace ascites in the abdomen. Images of the abdominal aorta and visualized iliac arteries are unremarkable. Main visceral arteries are patent with exception of the renal arteries. Renal arteries and kidneys are not identified and presumably these have been removed. Patchy ground-glass densities in the left lung with some volume loss in left lower lobe. Review of the MIP images confirms the above findings. IMPRESSION: Enlargement of the left biceps musculature compatible with history of a hematoma. There is a 2.5 cm hyperdense area around the left brachial vein which could be related to the hematoma but difficult to exclude a pseudoaneurysm in this area. This area could be further characterized with a vascular ultrasound. Limited evaluation of the vascular structures in the forearm. Left rib fractures of unknown age. Patchy densities throughout the left lung. Electronically Signed   By: Richarda OverlieAdam  Henn M.D.   On: 05/16/2018 09:00    Assessment/Plan Principal Problem:   Acute on chronic respiratory failure with hypoxia (HCC) Active Problems:   Aspiration pneumonia (HCC)   Seizure disorder (HCC)   Cardiac arrest (HCC)   End stage renal disease on dialysis (HCC)   Chronic systolic heart failure (HCC)   1. Acute on chronic respiratory  failure with hypoxia continue with T collar as noted the goal is 12 hours 2. Pneumonia due to aspiration clinically improved 3. Seizure disorder stable no active seizures 4. End-stage renal disease followed by nephrology continue with supportive care. 5. Chronic systolic heart failure right now is compensated we will continue to monitor   I have personally seen and evaluated the patient, evaluated laboratory and imaging results, formulated the assessment and plan and placed orders. The Patient requires high complexity decision making for assessment and support.  Case was discussed on Rounds with the Respiratory Therapy Staff  Yevonne PaxSaadat A Khan, MD [facility] Pulmonary Critical Care Medicine Sleep Medicine

## 2018-05-17 NOTE — Progress Notes (Signed)
[facility] [place]  Subjective  - Left arm hematoma stable over weekend.  Hand warm.     Objective  Left UE AC hematoma/ edema markings on skin indicate unchanged. Skin is in tact without damage.  Area is firm to palpation, but not tender per patients response.   Palpable radial pulse distally, motor intact, as well as sensation.    CTA: LUE Left upper extremity vasculature: Normal appearance of left axillary artery without aneurysm or dissection. The left brachial artery is patent but there is narrowing of the brachial artery in the distal humerus region which appears to be associated with a soft tissue hematoma. There is some motion artifact and streak artifact in the distal humeral region but no evidence for a significant aneurysm or active contrast extravasation. Hyperdense material around the distal brachial artery is probably associated with the hematoma. Difficult to assess for a dissection due to the imaging limitations. Images of the forearm vessels are limited. Radial artery is patent to the wrist. Ulnar artery is not well demonstrated on this examination.  IMPRESSION: Enlargement of the left biceps musculature compatible with history of a hematoma. There is a 2.5 cm hyperdense area around the left brachial vein which could be related to the hematoma but difficult to exclude a pseudoaneurysm in this area. This area could be further characterized with a vascular ultrasound.  Assessment/Planning:   Left arm appears stable through the weekend.  Did review CTA and certainly has a hematoma with concern for potential underlying pseudoaneurysm.  We will make a decision this week regarding any intervention.  Lori Gutierrez  Lori J. Clark, MD [facility] [place] Office: [phone] Pager: [phone]

## 2018-05-18 DIAGNOSIS — J9621 Acute and chronic respiratory failure with hypoxia: Secondary | ICD-10-CM | POA: Diagnosis not present

## 2018-05-18 DIAGNOSIS — I5022 Chronic systolic (congestive) heart failure: Secondary | ICD-10-CM | POA: Diagnosis not present

## 2018-05-18 DIAGNOSIS — I469 Cardiac arrest, cause unspecified: Secondary | ICD-10-CM | POA: Diagnosis not present

## 2018-05-18 DIAGNOSIS — J690 Pneumonitis due to inhalation of food and vomit: Secondary | ICD-10-CM | POA: Diagnosis not present

## 2018-05-18 LAB — CBC
HCT: 27.8 % — ABNORMAL LOW (ref 36.0–46.0)
HEMOGLOBIN: 8.5 g/dL — AB (ref 12.0–15.0)
MCH: 27.4 pg (ref 26.0–34.0)
MCHC: 30.6 g/dL (ref 30.0–36.0)
MCV: 89.7 fL (ref 78.0–100.0)
PLATELETS: 460 10*3/uL — AB (ref 150–400)
RBC: 3.1 MIL/uL — AB (ref 3.87–5.11)
RDW: 19.2 % — ABNORMAL HIGH (ref 11.5–15.5)
WBC: 13.6 10*3/uL — AB (ref 4.0–10.5)

## 2018-05-18 LAB — RENAL FUNCTION PANEL
ALBUMIN: 2.7 g/dL — AB (ref 3.5–5.0)
ANION GAP: 16 — AB (ref 5–15)
BUN: 88 mg/dL — ABNORMAL HIGH (ref 6–20)
CALCIUM: 8.8 mg/dL — AB (ref 8.9–10.3)
CO2: 24 mmol/L (ref 22–32)
CREATININE: 3.49 mg/dL — AB (ref 0.44–1.00)
Chloride: 89 mmol/L — ABNORMAL LOW (ref 98–111)
GFR calc non Af Amer: 16 mL/min — ABNORMAL LOW (ref >60)
GFR, EST AFRICAN AMERICAN: 18 mL/min — AB (ref >60)
Glucose, Bld: 119 mg/dL — ABNORMAL HIGH (ref 70–99)
PHOSPHORUS: 5.4 mg/dL — AB (ref 2.5–4.6)
Potassium: 4.5 mmol/L (ref 3.5–5.1)
SODIUM: 129 mmol/L — AB (ref 135–145)

## 2018-05-18 NOTE — Progress Notes (Signed)
Pulmonary Critical Care Medicine [facility]   PULMONARY SERVICE  PROGRESS NOTE  Date of Service: 05/18/2018  Lori NobleMegan C Gutierrez  RUE:[id number]  DOB: [birth year]   DOA: 04/30/2018  Referring Physician: Carron CurieAli Hijazi, MD  HPI: Lori Gutierrez is a 37 y.o. female seen for follow up of Acute on Chronic Respiratory Failure.  She continues to do well on T collar.  She is on aerosolized T collar goal is for 16 hours.  Her abdominal CT had shown some lower lobe consolidation.  I have asked where a CT of the chest to be done to further evaluate.  Interventional radiology vascular surgery has been following the hematoma in her arm possible washout later in the week  Medications: Reviewed on Rounds  Physical Exam:  Vitals: Temperature 97.0 pulse 74 respiratory rate 24 blood pressure 171/99 saturations 91%  Ventilator Settings off the ventilator on T collar  . General: Comfortable at this time . Eyes: Grossly normal lids, irises & conjunctiva . ENT: grossly tongue is normal . Neck: no obvious mass . Cardiovascular: S1 S2 normal no gallop . Respiratory: No rhonchi no rales . Abdomen: soft . Skin: no rash seen on limited exam . Musculoskeletal: not rigid . Psychiatric:unable to assess . Neurologic: no seizure no involuntary movements         Lab Data:   Basic Metabolic Panel: Recent Labs  Lab 05/13/18 0436 05/15/18 0610  NA 128* 129*  K 4.0 4.1  CL 89* 92*  CO2 23 25  GLUCOSE 119* 136*  BUN 73* 70*  CREATININE 3.01* 2.74*  CALCIUM 9.2 9.0  PHOS 5.0* 3.8    Liver Function Tests: Recent Labs  Lab 05/13/18 0436 05/15/18 0610  ALBUMIN 2.9* 2.7*   No results for input(s): LIPASE, AMYLASE in the last 168 hours. No results for input(s): AMMONIA in the last 168 hours.  CBC: Recent Labs  Lab 05/12/18 1651 05/12/18 2221 05/13/18 0436 05/15/18 0610 05/18/18 0743  WBC  --   --  17.8* 16.6* 13.6*  HGB 9.5* 9.1* 9.1* 8.5* 8.5*  HCT 30.4* 29.5* 29.4* 27.1* 27.8*   MCV  --   --  90.5 90.0 89.7  PLT  --   --  374 337 460*    Cardiac Enzymes: No results for input(s): CKTOTAL, CKMB, CKMBINDEX, TROPONINI in the last 168 hours.  BNP (last 3 results) No results for input(s): BNP in the last 8760 hours.  ProBNP (last 3 results) No results for input(s): PROBNP in the last 8760 hours.  Radiological Exams: No results found.  Assessment/Plan Principal Problem:   Acute on chronic respiratory failure with hypoxia (HCC) Active Problems:   Aspiration pneumonia (HCC)   Seizure disorder (HCC)   Cardiac arrest (HCC)   End stage renal disease on dialysis (HCC)   Chronic systolic heart failure (HCC)   1. Acute on chronic respiratory failure with hypoxia continue with T collar weaning as ordered the goal is 16 hours.  We will move on the wean however she may need to be going back to the OR so we will keep that in mind 2. Pneumonia due to aspiration treated follow-up CT was ordered 3. Seizure disorder no active seizures noted. 4. Cardiac arrest stable rhythm 5. End-stage renal disease on hemodialysis continue to follow 6. Chronic systolic heart failure at baseline we will continue with supportive care monitor fluids   I have personally seen and evaluated the patient, evaluated laboratory and imaging results, formulated the assessment and plan and  placed orders. The Patient requires high complexity decision making for assessment and support.  Case was discussed on Rounds with the Respiratory Therapy Staff  Allyne Gee, MD [facility] Pulmonary Critical Care Medicine Sleep Medicine

## 2018-05-18 NOTE — Progress Notes (Signed)
[facility]  ROUNDING NOTE   Subjective:  Patient now transition to air/trach collar. Tolerating this well. Seen and evaluated during hemodialysis.   Objective:  Vital signs in last 24 hours:  Temperature 97 pulse 79 respirations 24 blood pressure 177/99  Physical Exam: General: Critically ill appearing  Head: [place]/AT NG in place  Eyes: Anicteric  Neck: Tracheostomy in place  Lungs:  Scattered rhonchi, vent assisted  Heart: S1S2 no rubs  Abdomen:  Soft, nontender, bowel sounds present  Extremities: trace peripheral edema.  Neurologic: Awake but not following commands  Skin: No lesions  Access: L IJ permcath    Basic Metabolic Panel: Recent Labs  Lab 05/13/18 0436 05/15/18 0610 05/18/18 0743  NA 128* 129* 129*  K 4.0 4.1 4.5  CL 89* 92* 89*  CO2 23 25 24   GLUCOSE 119* 136* 119*  BUN 73* 70* 88*  CREATININE 3.01* 2.74* 3.49*  CALCIUM 9.2 9.0 8.8*  PHOS 5.0* 3.8 5.4*    Liver Function Tests: Recent Labs  Lab 05/13/18 0436 05/15/18 0610 05/18/18 0743  ALBUMIN 2.9* 2.7* 2.7*   No results for input(s): LIPASE, AMYLASE in the last 168 hours. No results for input(s): AMMONIA in the last 168 hours.  CBC: Recent Labs  Lab 05/12/18 1651 05/12/18 2221 05/13/18 0436 05/15/18 0610 05/18/18 0743  WBC  --   --  17.8* 16.6* 13.6*  HGB 9.5* 9.1* 9.1* 8.5* 8.5*  HCT 30.4* 29.5* 29.4* 27.1* 27.8*  MCV  --   --  90.5 90.0 89.7  PLT  --   --  374 337 460*    Cardiac Enzymes: No results for input(s): CKTOTAL, CKMB, CKMBINDEX, TROPONINI in the last 168 hours.  BNP: Invalid input(s): POCBNP  CBG: No results for input(s): GLUCAP in the last 168 hours.  Microbiology: Results for orders placed or performed during the hospital encounter of 04/30/18  Culture, respiratory (non-expectorated)     Status: None   Collection Time: 05/01/18  7:52 AM  Result Value Ref Range Status   Specimen Description TRACHEAL ASPIRATE  Final   Special Requests NONE  Final    Gram Stain   Final    ABUNDANT WBC PRESENT,BOTH PMN AND MONONUCLEAR NO ORGANISMS SEEN    Culture   Final    RARE Consistent with normal respiratory flora. Performed at [facility], [address] [place], [place] [address]    Report Status 05/03/2018 FINAL  Final  Culture, respiratory (non-expectorated)     Status: None   Collection Time: 05/13/18  1:50 PM  Result Value Ref Range Status   Specimen Description TRACHEAL ASPIRATE  Final   Special Requests NONE  Final   Gram Stain   Final    ABUNDANT WBC PRESENT, PREDOMINANTLY PMN RARE GRAM POSITIVE COCCI    Culture   Final    RARE Consistent with normal respiratory flora. Performed at [facility], [address] [place], [place] [address]    Report Status 05/15/2018 FINAL  Final  Culture, blood (routine x 2)     Status: None (Preliminary result)   Collection Time: 05/17/18  4:16 PM  Result Value Ref Range Status   Specimen Description BLOOD RIGHT HAND  Final   Special Requests   Final    BOTTLES DRAWN AEROBIC ONLY Blood Culture adequate volume   Culture   Final    NO GROWTH < 24 HOURS Performed at [facility], [address] [place], [place] [address]  Report Status PENDING  Incomplete  Culture, blood (routine x 2)     Status: None (Preliminary result)   Collection Time: 05/17/18  4:22 PM  Result Value Ref Range Status   Specimen Description BLOOD RIGHT HAND  Final   Special Requests   Final    BOTTLES DRAWN AEROBIC ONLY Blood Culture adequate volume   Culture   Final    NO GROWTH < 24 HOURS Performed at [facility], [address] [place], [place] [address]    Report Status PENDING  Incomplete    Coagulation Studies: No results for input(s): LABPROT, INR in the last 72 hours.  Urinalysis: No results for input(s): COLORURINE, LABSPEC, PHURINE, GLUCOSEU, HGBUR, BILIRUBINUR, KETONESUR, PROTEINUR, UROBILINOGEN, NITRITE, LEUKOCYTESUR in the last 72 hours.  Invalid input(s):  APPERANCEUR    Imaging: No results found.   Medications:       Assessment/ Plan:  37 y.o. female with a PMHx of recent acute respiratory failure requiring intubation, aspiration pneumonia, cardiac arrest, chronic systolic heart failure, delay of cognitive development, end-stage renal disease on hemodialysis MWF, hypertension, seizure disorder, who was admitted to [facility] 04/30/2018 for ongoing management of acute respiratory failure, recent PEA arrest, and end-stage renal disease.   1.  ESRD on HD.    She is seen and evaluated during hemodialysis and tolerating well.  We plan to complete dialysis today and next dialysis will be on Wednesday.  2.  Acute respiratory failure.  Secondary to aspiration.   -Patient now transitioned to aerosolized trach collar.  Making significant improvement in terms of her respiratory status.  3.  Anemia of chronic kidney disease.  IMA globin stable at 8.5.  Continue to monitor.  4.  Secondary hyperparathyroidism.  Serum phosphorus up to 5.4 however this is likely related to the interval over the weekend.  Repeat serum phosphorus on Wednesday.  5.  Hypertension:   Continue the patient on amlodipine, clonidine, hydralazine, lisinopril, and metoprolol.    LOS: 0 Lori Gutierrez 8/19/20192:44 PM

## 2018-05-18 NOTE — Progress Notes (Addendum)
[facility] [place]  Subjective  - Left arm hematoma stable over weekend.  Hand warm.     Objective  Left UE AC hematoma/ edema markings on skin indicate unchanged. Skin is in tact without damage.  Area is firm to palpation, but not tender per patients response.     CTA: LUE Left upper extremity vasculature: Normal appearance of left axillary artery without aneurysm or dissection. The left brachial artery is patent but there is narrowing of the brachial artery in the distal humerus region which appears to be associated with a soft tissue hematoma. There is some motion artifact and streak artifact in the distal humeral region but no evidence for a significant aneurysm or active contrast extravasation. Hyperdense material around the distal brachial artery is probably associated with the hematoma. Difficult to assess for a dissection due to the imaging limitations. Images of the forearm vessels are limited. Radial artery is patent to the wrist. Ulnar artery is not well demonstrated on this examination.  IMPRESSION: Enlargement of the left biceps musculature compatible with history of a hematoma. There is a 2.5 cm hyperdense area around the left brachial vein which could be related to the hematoma but difficult to exclude a pseudoaneurysm in this area. This area could be further characterized with a vascular ultrasound.  Assessment/Planning:   Left arm remained stable today.  Her hand is warm with good capillary refill.  My impression is her hematoma is relatively unchanged since initial evaluation by our team.  Still concerned about a potential underlying pseudoaneurysm.  Likely plan for washout and repair later this week when available OR time.  Shaune Pascalhristopher J Abdulhamid Olgin J. Aarohi Redditt, MD [facility] [place] Office: [phone] Pager: [phone]   Now posted for Wednesday for left brachial pseudoaneurysm  repair.  Cephus Shellinghristopher J Keanu Lesniak

## 2018-05-19 ENCOUNTER — Other Ambulatory Visit (HOSPITAL_COMMUNITY): Payer: Self-pay

## 2018-05-19 DIAGNOSIS — J690 Pneumonitis due to inhalation of food and vomit: Secondary | ICD-10-CM | POA: Diagnosis not present

## 2018-05-19 DIAGNOSIS — J9621 Acute and chronic respiratory failure with hypoxia: Secondary | ICD-10-CM | POA: Diagnosis not present

## 2018-05-19 DIAGNOSIS — I5022 Chronic systolic (congestive) heart failure: Secondary | ICD-10-CM | POA: Diagnosis not present

## 2018-05-19 DIAGNOSIS — I469 Cardiac arrest, cause unspecified: Secondary | ICD-10-CM | POA: Diagnosis not present

## 2018-05-19 LAB — CBC
HCT: 29.8 % — ABNORMAL LOW (ref 36.0–46.0)
Hemoglobin: 9.2 g/dL — ABNORMAL LOW (ref 12.0–15.0)
MCH: 27.3 pg (ref 26.0–34.0)
MCHC: 30.9 g/dL (ref 30.0–36.0)
MCV: 88.4 fL (ref 78.0–100.0)
PLATELETS: 549 10*3/uL — AB (ref 150–400)
RBC: 3.37 MIL/uL — ABNORMAL LOW (ref 3.87–5.11)
RDW: 18.9 % — AB (ref 11.5–15.5)
WBC: 18.8 10*3/uL — ABNORMAL HIGH (ref 4.0–10.5)

## 2018-05-19 MED ORDER — IOPAMIDOL (ISOVUE-300) INJECTION 61%
75.0000 mL | Freq: Once | INTRAVENOUS | Status: AC | PRN
  Administered 2018-05-19: 75 mL via INTRAVENOUS

## 2018-05-19 MED ORDER — IOHEXOL 300 MG/ML  SOLN: 75.0000 mL | Freq: Once | INTRAMUSCULAR | Status: DC | PRN

## 2018-05-19 NOTE — Progress Notes (Signed)
[facility] [place]  Subjective  - Left arm hematoma remains stable.  No acute events.   Objective  Left UE AC hematoma/ edema markings on skin indicate unchanged. Skin is in tact without damage.  Area is firm to palpation, but not tender per patients response.     CTA: LUE Left upper extremity vasculature: Normal appearance of left axillary artery without aneurysm or dissection. The left brachial artery is patent but there is narrowing of the brachial artery in the distal humerus region which appears to be associated with a soft tissue hematoma. There is some motion artifact and streak artifact in the distal humeral region but no evidence for a significant aneurysm or active contrast extravasation. Hyperdense material around the distal brachial artery is probably associated with the hematoma. Difficult to assess for a dissection due to the imaging limitations. Images of the forearm vessels are limited. Radial artery is patent to the wrist. Ulnar artery is not well demonstrated on this examination.  IMPRESSION: Enlargement of the left biceps musculature compatible with history of a hematoma. There is a 2.5 cm hyperdense area around the left brachial vein which could be related to the hematoma but difficult to exclude a pseudoaneurysm in this area. This area could be further characterized with a vascular ultrasound.  Assessment/Planning:   Left arm remains stable.  Suspect underlying pseudoaneurysm.  Plan for OR tomorrow for repair and washout.  Her hand is warm with good capillary refill.  Please have NPO after midnight.  Appreciate getting consent from state for left brachial pseudoaneurysm repair.  Shaune Pascalhristopher J Clark  Christopher J. Clark, MD [facility] [place] Office: [phone] Pager: [phone]

## 2018-05-19 NOTE — Progress Notes (Signed)
Pulmonary Critical Care [facility]   PULMONARY SERVICE  PROGRESS NOTE  Date of Service: 05/19/2018  Lori Gutierrez  ZOX:[id number]  DOB: Feb 21, 1981   DOA: 04/30/2018  Referring Physician: Carron CurieAli Hijazi, MD  HPI: Lori Gutierrez is a 37 y.o. female seen for follow up of Acute on Chronic Respiratory Failure.  She continues to wean on T collar has been doing fairly well with only issue his oxygen requirements are at 40%.  The goal of the wean is about 12 to 16 hours  Medications: Reviewed on Rounds  Physical Exam:  Vitals: Temperature 96.8 pulse 99 respiratory rate 22 blood pressure 156/56 saturations 97%  Ventilator Settings off the ventilator on T collar  . General: Comfortable at this time . Eyes: Grossly normal lids, irises & conjunctiva . ENT: grossly tongue is normal . Neck: no obvious mass . Cardiovascular: S1 S2 normal no gallop . Respiratory: Coarse breath sounds no rhonchi . Abdomen: soft . Skin: no rash seen on limited exam . Musculoskeletal: not rigid . Psychiatric:unable to assess . Neurologic: no seizure no involuntary movements         Lab Data:   Basic Metabolic Panel: Recent Labs  Lab 05/13/18 0436 05/15/18 0610 05/18/18 0743  NA 128* 129* 129*  K 4.0 4.1 4.5  CL 89* 92* 89*  CO2 23 25 24   GLUCOSE 119* 136* 119*  BUN 73* 70* 88*  CREATININE 3.01* 2.74* 3.49*  CALCIUM 9.2 9.0 8.8*  PHOS 5.0* 3.8 5.4*    Liver Function Tests: Recent Labs  Lab 05/13/18 0436 05/15/18 0610 05/18/18 0743  ALBUMIN 2.9* 2.7* 2.7*   No results for input(s): LIPASE, AMYLASE in the last 168 hours. No results for input(s): AMMONIA in the last 168 hours.  CBC: Recent Labs  Lab 05/12/18 2221 05/13/18 0436 05/15/18 0610 05/18/18 0743 05/19/18 0822  WBC  --  17.8* 16.6* 13.6* 18.8*  HGB 9.1* 9.1* 8.5* 8.5* 9.2*  HCT 29.5* 29.4* 27.1* 27.8* 29.8*  MCV  --  90.5 90.0 89.7 88.4  PLT  --  374 337 460* 549*    Cardiac Enzymes: No  results for input(s): CKTOTAL, CKMB, CKMBINDEX, TROPONINI in the last 168 hours.  BNP (last 3 results) No results for input(s): BNP in the last 8760 hours.  ProBNP (last 3 results) No results for input(s): PROBNP in the last 8760 hours.  Radiological Exams: Ct Chest W Contrast  Result Date: 05/19/2018 CLINICAL DATA:  Interstitial lung disease. Acute on chronic respiratory failure. Hypoxia. Inpatient. EXAM: CT CHEST WITH CONTRAST TECHNIQUE: Multidetector CT imaging of the chest was performed during intravenous contrast administration. CONTRAST:  75mL ISOVUE-300 IOPAMIDOL (ISOVUE-300) INJECTION 61% COMPARISON:  05/11/2018 chest radiograph.  12/15/2017 chest CT. FINDINGS: Cardiovascular: Stable mild-to-moderate cardiomegaly. No significant pericardial effusion/thickening. Left internal jugular central venous catheter terminates in the right atrium. Normal course and caliber of the thoracic aorta. Stable borderline prominent main pulmonary artery (3.3 cm diameter). No central pulmonary emboli. Mediastinum/Nodes: No discrete thyroid nodules. Enteric tube enters the stomach with the tip not seen on this study. New mild left axillary adenopathy measures up to the 1.0 cm (series 3/image 68). New mild right axillary adenopathy measures up to 1.0 cm (series 3/image 64). New mild left supraclavicular adenopathy measures up to 1.1 cm (series 3/image 25). No right axillary adenopathy. Right paratracheal adenopathy up to 1.5 cm (series 3/image 50), previously 1.3 cm, mildly increased. Mildly enlarged 1.1 cm subcarinal node (series 3/image 62), stable. No hilar adenopathy.  Lungs/Pleura: Tracheostomy tube terminates in tracheal lumen at the level of the thoracic inlet. No pneumothorax. Trace dependent right pleural effusion. Small dependent left pleural effusion. There is patchy consolidation and volume loss throughout most of the left lower lobe. There is less prominent patchy bandlike consolidation with some volume  loss throughout the bilateral upper lobes and right lower lobe. Mild patchy ground-glass opacity and mild interlobular septal thickening throughout both lungs. No discrete lung masses or significant pulmonary nodules. No significant regions of traction bronchiectasis, architectural distortion or frank honeycombing. Upper abdomen: No acute abnormality. Musculoskeletal: No aggressive appearing focal osseous lesions. There are fractures of the anterior left first through fifth ribs, which appear new since 12/15/2017 chest CT, which appear subacute and healing. IMPRESSION: 1. Fractures of the anterior left first through fifth ribs, new since 12/15/2017 CT, which appear subacute and healing. Correlate with injury mechanism, such as possibly CPR. 2. Stable cardiomegaly. Small left and trace right dependent pleural effusions. 3. Mild patchy ground-glass opacity throughout both lungs with mild interlobular septal thickening, suggesting mild pulmonary edema. 4. Patchy consolidation and volume loss throughout both lungs, most prominent in the left lower lobe, favor predominantly atelectasis, difficult to exclude a component of pneumonia. No specific findings of interstitial lung disease. 5. Mediastinal and bilateral axillary adenopathy, mildly increased, nonspecific. 6. Stable borderline dilated main pulmonary artery, suggesting chronic pulmonary arterial hypertension. 7. Well-positioned support structures. Electronically Signed   By: Delbert PhenixJason A Poff M.D.   On: 05/19/2018 08:23    Assessment/Plan Principal Problem:   Acute on chronic respiratory failure with hypoxia (HCC) Active Problems:   Aspiration pneumonia (HCC)   Seizure disorder (HCC)   Cardiac arrest (HCC)   End stage renal disease on dialysis (HCC)   Chronic systolic heart failure (HCC)   1. Acute on chronic respiratory failure with hypoxia continue with weaning on T collar as tolerated 2. Pneumonia due to aspiration continue with supportive care CT scan  results are noted as above.  No consolidation more aspiration appearance. 3. Seizure disorder no active seizures are noted. 4. End-stage renal disease on dialysis continue to follow 5. Chronic systolic heart failure compensated at this time continue present management   I have personally seen and evaluated the patient, evaluated laboratory and imaging results, formulated the assessment and plan and placed orders. The Patient requires high complexity decision making for assessment and support.  Case was discussed on Rounds with the Respiratory Therapy Staff  Yevonne PaxSaadat A Khan, MD [facility] Pulmonary Critical Care Medicine Sleep Medicine

## 2018-05-20 ENCOUNTER — Inpatient Hospital Stay
Admission: AD | Admit: 2018-05-20 | Discharge: 2018-06-30 | Disposition: E | Payer: Medicare Other | Source: Ambulatory Visit | Attending: Internal Medicine | Admitting: Internal Medicine

## 2018-05-20 ENCOUNTER — Encounter (HOSPITAL_COMMUNITY): Payer: Self-pay | Admitting: Anesthesiology

## 2018-05-20 ENCOUNTER — Encounter: Payer: Self-pay | Admitting: Anesthesiology

## 2018-05-20 ENCOUNTER — Encounter (HOSPITAL_COMMUNITY)
Admission: AD | Disposition: A | Discharge status: Skilled Nursing Facility | Payer: Self-pay | Source: Ambulatory Visit | Attending: Internal Medicine

## 2018-05-20 DIAGNOSIS — I469 Cardiac arrest, cause unspecified: Secondary | ICD-10-CM | POA: Diagnosis not present

## 2018-05-20 DIAGNOSIS — J690 Pneumonitis due to inhalation of food and vomit: Secondary | ICD-10-CM | POA: Diagnosis present

## 2018-05-20 DIAGNOSIS — R140 Abdominal distension (gaseous): Secondary | ICD-10-CM

## 2018-05-20 DIAGNOSIS — J189 Pneumonia, unspecified organism: Secondary | ICD-10-CM

## 2018-05-20 DIAGNOSIS — I5022 Chronic systolic (congestive) heart failure: Secondary | ICD-10-CM | POA: Diagnosis not present

## 2018-05-20 DIAGNOSIS — Z4659 Encounter for fitting and adjustment of other gastrointestinal appliance and device: Secondary | ICD-10-CM

## 2018-05-20 DIAGNOSIS — I721 Aneurysm of artery of upper extremity: Secondary | ICD-10-CM

## 2018-05-20 DIAGNOSIS — Z0189 Encounter for other specified special examinations: Secondary | ICD-10-CM

## 2018-05-20 DIAGNOSIS — R58 Hemorrhage, not elsewhere classified: Secondary | ICD-10-CM

## 2018-05-20 DIAGNOSIS — J9621 Acute and chronic respiratory failure with hypoxia: Secondary | ICD-10-CM | POA: Diagnosis present

## 2018-05-20 DIAGNOSIS — G40909 Epilepsy, unspecified, not intractable, without status epilepticus: Secondary | ICD-10-CM

## 2018-05-20 DIAGNOSIS — Z431 Encounter for attention to gastrostomy: Secondary | ICD-10-CM

## 2018-05-20 DIAGNOSIS — R82998 Other abnormal findings in urine: Secondary | ICD-10-CM

## 2018-05-20 DIAGNOSIS — T85598A Other mechanical complication of other gastrointestinal prosthetic devices, implants and grafts, initial encounter: Secondary | ICD-10-CM

## 2018-05-20 DIAGNOSIS — R0902 Hypoxemia: Secondary | ICD-10-CM

## 2018-05-20 DIAGNOSIS — Z992 Dependence on renal dialysis: Secondary | ICD-10-CM

## 2018-05-20 DIAGNOSIS — N186 End stage renal disease: Secondary | ICD-10-CM

## 2018-05-20 DIAGNOSIS — T148XXA Other injury of unspecified body region, initial encounter: Secondary | ICD-10-CM

## 2018-05-20 HISTORY — PX: ARTERY REPAIR: SHX559

## 2018-05-20 LAB — RENAL FUNCTION PANEL
ANION GAP: 14 (ref 5–15)
Albumin: 2.3 g/dL — ABNORMAL LOW (ref 3.5–5.0)
BUN: 45 mg/dL — ABNORMAL HIGH (ref 6–20)
CO2: 24 mmol/L (ref 22–32)
Calcium: 8.5 mg/dL — ABNORMAL LOW (ref 8.9–10.3)
Chloride: 94 mmol/L — ABNORMAL LOW (ref 98–111)
Creatinine, Ser: 3.07 mg/dL — ABNORMAL HIGH (ref 0.44–1.00)
GFR calc Af Amer: 21 mL/min — ABNORMAL LOW (ref >60)
GFR calc non Af Amer: 18 mL/min — ABNORMAL LOW (ref >60)
Glucose, Bld: 89 mg/dL (ref 70–99)
POTASSIUM: 3.8 mmol/L (ref 3.5–5.1)
Phosphorus: 4.6 mg/dL (ref 2.5–4.6)
SODIUM: 132 mmol/L — AB (ref 135–145)

## 2018-05-20 LAB — CBC
HEMATOCRIT: 25.7 % — AB (ref 36.0–46.0)
HEMATOCRIT: 26.7 % — AB (ref 36.0–46.0)
HEMOGLOBIN: 8.2 g/dL — AB (ref 12.0–15.0)
HEMOGLOBIN: 8.2 g/dL — AB (ref 12.0–15.0)
MCH: 28.2 pg (ref 26.0–34.0)
MCH: 27.5 pg (ref 26.0–34.0)
MCHC: 31.9 g/dL (ref 30.0–36.0)
MCHC: 30.7 g/dL (ref 30.0–36.0)
MCV: 88.3 fL (ref 78.0–100.0)
MCV: 89.6 fL (ref 78.0–100.0)
Platelets: 512 10*3/uL — ABNORMAL HIGH (ref 150–400)
Platelets: 523 10*3/uL — ABNORMAL HIGH (ref 150–400)
RBC: 2.91 MIL/uL — ABNORMAL LOW (ref 3.87–5.11)
RBC: 2.98 MIL/uL — ABNORMAL LOW (ref 3.87–5.11)
RDW: 19.2 % — ABNORMAL HIGH (ref 11.5–15.5)
RDW: 19.3 % — ABNORMAL HIGH (ref 11.5–15.5)
WBC: 17.1 10*3/uL — ABNORMAL HIGH (ref 4.0–10.5)
WBC: 14.8 10*3/uL — ABNORMAL HIGH (ref 4.0–10.5)

## 2018-05-20 SURGERY — REPAIR, ARTERY, BRACHIAL
Anesthesia: General | Site: Arm Upper | Laterality: Left

## 2018-05-20 MED ORDER — CEFAZOLIN SODIUM-DEXTROSE 2-3 GM-%(50ML) IV SOLR
2.0000 g | Freq: Once | INTRAVENOUS | Status: AC
  Administered 2018-05-20: 2 g via INTRAVENOUS
  Filled 2018-05-20: qty 50

## 2018-05-20 MED ORDER — FENTANYL CITRATE (PF) 100 MCG/2ML IJ SOLN: 25.0000 ug | INTRAMUSCULAR | Status: DC | PRN

## 2018-05-20 MED ORDER — CEFAZOLIN SODIUM 1 G IJ SOLR
INTRAMUSCULAR | Status: AC
  Filled 2018-05-20: qty 10

## 2018-05-20 MED ORDER — HEPARIN SODIUM (PORCINE) 1000 UNIT/ML IJ SOLN
INTRAMUSCULAR | Status: DC | PRN
  Administered 2018-05-20: 3000 [IU] via INTRAVENOUS

## 2018-05-20 MED ORDER — MEPERIDINE HCL 50 MG/ML IJ SOLN: 6.2500 mg | INTRAMUSCULAR | Status: DC | PRN

## 2018-05-20 MED ORDER — ACETAMINOPHEN 325 MG PO TABS: 325.0000 mg | ORAL_TABLET | ORAL | Status: DC | PRN

## 2018-05-20 MED ORDER — ONDANSETRON HCL 4 MG/2ML IJ SOLN
INTRAMUSCULAR | Status: DC | PRN
  Administered 2018-05-20: 4 mg via INTRAVENOUS

## 2018-05-20 MED ORDER — SODIUM CHLORIDE 0.9 % IV SOLN
INTRAVENOUS | Status: AC
  Filled 2018-05-20: qty 1.2

## 2018-05-20 MED ORDER — SODIUM CHLORIDE 0.9 % IV SOLN
INTRAVENOUS | Status: DC | PRN
  Administered 2018-05-20 (×2): via INTRAVENOUS

## 2018-05-20 MED ORDER — MIDAZOLAM HCL 2 MG/2ML IJ SOLN
INTRAMUSCULAR | Status: AC
  Filled 2018-05-20: qty 2

## 2018-05-20 MED ORDER — ONDANSETRON HCL 4 MG/2ML IJ SOLN: 4.0000 mg | Freq: Once | INTRAMUSCULAR | Status: DC | PRN

## 2018-05-20 MED ORDER — SODIUM CHLORIDE 0.9 % IV SOLN
INTRAVENOUS | Status: DC | PRN
  Administered 2018-05-20: 15 ug/min via INTRAVENOUS

## 2018-05-20 MED ORDER — LIDOCAINE-EPINEPHRINE (PF) 1 %-1:200000 IJ SOLN
INTRAMUSCULAR | Status: AC
  Filled 2018-05-20: qty 30

## 2018-05-20 MED ORDER — 0.9 % SODIUM CHLORIDE (POUR BTL) OPTIME
TOPICAL | Status: DC | PRN
  Administered 2018-05-20: 1000 mL

## 2018-05-20 MED ORDER — FENTANYL CITRATE (PF) 250 MCG/5ML IJ SOLN
INTRAMUSCULAR | Status: AC
  Filled 2018-05-20: qty 5

## 2018-05-20 MED ORDER — SODIUM CHLORIDE 0.9 % IV SOLN
INTRAVENOUS | Status: DC | PRN
  Administered 2018-05-20: 500 mL

## 2018-05-20 MED ORDER — FENTANYL CITRATE (PF) 100 MCG/2ML IJ SOLN
INTRAMUSCULAR | Status: DC | PRN
  Administered 2018-05-20: 50 ug via INTRAVENOUS
  Administered 2018-05-20 (×4): 25 ug via INTRAVENOUS

## 2018-05-20 MED ORDER — PROPOFOL 10 MG/ML IV BOLUS
INTRAVENOUS | Status: AC
  Filled 2018-05-20: qty 20

## 2018-05-20 MED ORDER — ACETAMINOPHEN 160 MG/5ML PO SOLN: 325.0000 mg | ORAL | Status: DC | PRN

## 2018-05-20 MED ORDER — OXYCODONE HCL 5 MG/5ML PO SOLN: 5.0000 mg | Freq: Once | ORAL | Status: DC | PRN

## 2018-05-20 MED ORDER — DEXAMETHASONE SODIUM PHOSPHATE 10 MG/ML IJ SOLN
INTRAMUSCULAR | Status: DC | PRN
  Administered 2018-05-20: 4 mg via INTRAVENOUS

## 2018-05-20 MED ORDER — OXYCODONE HCL 5 MG PO TABS: 5.0000 mg | ORAL_TABLET | Freq: Once | ORAL | Status: DC | PRN

## 2018-05-20 MED ORDER — HEMOSTATIC AGENTS (NO CHARGE) OPTIME
TOPICAL | Status: DC | PRN
  Administered 2018-05-20: 1 via TOPICAL

## 2018-05-20 SURGICAL SUPPLY — 38 items
BANDAGE ACE 4X5 VEL STRL LF (GAUZE/BANDAGES/DRESSINGS) ×3 IMPLANT
BNDG ESMARK 4X9 LF (GAUZE/BANDAGES/DRESSINGS) ×3 IMPLANT
CANISTER SUCT 3000ML PPV (MISCELLANEOUS) ×3 IMPLANT
CLIP VESOCCLUDE MED 6/CT (CLIP) ×3 IMPLANT
CLIP VESOCCLUDE SM WIDE 6/CT (CLIP) ×9 IMPLANT
COVER PROBE W GEL 5X96 (DRAPES) ×3 IMPLANT
CUFF TOURNIQUET SINGLE 18IN (TOURNIQUET CUFF) ×3 IMPLANT
CUFF TOURNIQUET SINGLE 24IN (TOURNIQUET CUFF) IMPLANT
DECANTER SPIKE VIAL GLASS SM (MISCELLANEOUS) IMPLANT
DERMABOND ADVANCED (GAUZE/BANDAGES/DRESSINGS) ×2
DERMABOND ADVANCED .7 DNX12 (GAUZE/BANDAGES/DRESSINGS) ×1 IMPLANT
DRAIN HEMOVAC 1/8 X 5 (WOUND CARE) ×3 IMPLANT
DRSG COVADERM 4X8 (GAUZE/BANDAGES/DRESSINGS) ×3 IMPLANT
ELECT REM PT RETURN 9FT ADLT (ELECTROSURGICAL) ×3
ELECTRODE REM PT RTRN 9FT ADLT (ELECTROSURGICAL) ×1 IMPLANT
EVACUATOR SILICONE 100CC (DRAIN) ×3 IMPLANT
GAUZE SPONGE 4X4 16PLY XRAY LF (GAUZE/BANDAGES/DRESSINGS) ×3 IMPLANT
GLOVE BIO SURGEON STRL SZ7.5 (GLOVE) ×3 IMPLANT
GLOVE BIOGEL PI IND STRL 8 (GLOVE) ×1 IMPLANT
GLOVE BIOGEL PI INDICATOR 8 (GLOVE) ×2
GOWN STRL REUS W/ TWL LRG LVL3 (GOWN DISPOSABLE) ×2 IMPLANT
GOWN STRL REUS W/ TWL XL LVL3 (GOWN DISPOSABLE) ×2 IMPLANT
GOWN STRL REUS W/TWL LRG LVL3 (GOWN DISPOSABLE) ×4
GOWN STRL REUS W/TWL XL LVL3 (GOWN DISPOSABLE) ×4
HEMOSTAT SPONGE AVITENE ULTRA (HEMOSTASIS) ×3 IMPLANT
KIT BASIN OR (CUSTOM PROCEDURE TRAY) ×3 IMPLANT
KIT TURNOVER KIT B (KITS) ×3 IMPLANT
NS IRRIG 1000ML POUR BTL (IV SOLUTION) ×3 IMPLANT
PACK CV ACCESS (CUSTOM PROCEDURE TRAY) ×3 IMPLANT
PAD ARMBOARD 7.5X6 YLW CONV (MISCELLANEOUS) ×6 IMPLANT
SUT ETHILON 3 0 PS 1 (SUTURE) ×12 IMPLANT
SUT MNCRL AB 4-0 PS2 18 (SUTURE) ×3 IMPLANT
SUT PROLENE 6 0 BV (SUTURE) ×12 IMPLANT
SUT VIC AB 3-0 SH 27 (SUTURE) ×4
SUT VIC AB 3-0 SH 27X BRD (SUTURE) ×2 IMPLANT
TOWEL GREEN STERILE (TOWEL DISPOSABLE) ×3 IMPLANT
UNDERPAD 30X30 (UNDERPADS AND DIAPERS) ×3 IMPLANT
WATER STERILE IRR 1000ML POUR (IV SOLUTION) ×3 IMPLANT

## 2018-05-20 NOTE — Anesthesia Postprocedure Evaluation (Signed)
Anesthesia Post Note  Patient: Lori Gutierrez  Procedure(s) Performed: BRACHIAL ARTERY PSEUDOANEURYSM REPAIR WITH HEMATOMA EVACUATION (Left Arm Upper)     Patient location during evaluation: PACU Anesthesia Type: General Level of consciousness: awake and alert Pain management: pain level controlled Vital Signs Assessment: post-procedure vital signs reviewed and stable Respiratory status: spontaneous breathing, nonlabored ventilation, respiratory function stable and patient connected to nasal cannula oxygen Cardiovascular status: blood pressure returned to baseline and stable Postop Assessment: no apparent nausea or vomiting Anesthetic complications: no    Last Vitals:  Vitals:   2018-02-03 1707 2018-02-03 1721  BP: (!) 165/90 (!) 146/83  Pulse: 94 95  Resp: 12 12  Temp:  36.5 C  SpO2: 97% 97%    Last Pain:  Vitals:   2018-02-03 1721  PainSc: 0-No pain                 Yanissa Michalsky DAVID

## 2018-05-20 NOTE — Anesthesia Preprocedure Evaluation (Addendum)
Anesthesia Evaluation  Patient identified by MRN, date of birth, ID band Patient confused    Reviewed: Allergy & Precautions, NPO status , Patient's Chart, lab work & pertinent test results, Unable to perform ROS - Chart review only  History of Anesthesia Complications Negative for: history of anesthetic complications  Airway Mallampati: Trach       Dental   Pulmonary   Acute on chronic respiratory failure Aspiration pneumonia Tracheostomy     + decreased breath sounds      Cardiovascular hypertension, +CHF   Rhythm:Regular Rate:Normal   '19 TTE (Care Everywhere) - EF 60-65%. Mobile structure seen in RA. Mild TR. (TTE from 2017 showed EF 20-25%, since recovered as evidenced by multiple more recent echos)    Neuro/Psych Seizures -,  negative psych ROS   GI/Hepatic negative GI ROS, Neg liver ROS,   Endo/Other  negative endocrine ROS  Renal/GU ESRF and DialysisRenal disease  negative genitourinary   Musculoskeletal negative musculoskeletal ROS (+)   Abdominal   Peds  (+) mental retardation Hematology  (+) anemia ,  Thrombocytosis Leukocytosis    Anesthesia Other Findings Hyponatremia Hypochloremia  Reproductive/Obstetrics                           Anesthesia Physical Anesthesia Plan  ASA: IV  Anesthesia Plan: General   Post-op Pain Management:    Induction: Intravenous  PONV Risk Score and Plan: 3 and Treatment may vary due to age or medical condition, Ondansetron and Dexamethasone  Airway Management Planned: Tracheostomy  Additional Equipment: None  Intra-op Plan:   Post-operative Plan: Post-operative intubation/ventilation  Informed Consent:   History available from chart only  Plan Discussed with: CRNA and Anesthesiologist  Anesthesia Plan Comments:        Anesthesia Quick Evaluation

## 2018-05-20 NOTE — Progress Notes (Signed)
[facility] [place]  Subjective  - Left arm hematoma remains stable.  No acute events.   Objective  Left UE AC hematoma/ edema markings on skin indicate unchanged. Skin is in tact without damage.  Area is firm to palpation, but not tender per patients response.     CTA: LUE Left upper extremity vasculature: Normal appearance of left axillary artery without aneurysm or dissection. The left brachial artery is patent but there is narrowing of the brachial artery in the distal humerus region which appears to be associated with a soft tissue hematoma. There is some motion artifact and streak artifact in the distal humeral region but no evidence for a significant aneurysm or active contrast extravasation. Hyperdense material around the distal brachial artery is probably associated with the hematoma. Difficult to assess for a dissection due to the imaging limitations. Images of the forearm vessels are limited. Radial artery is patent to the wrist. Ulnar artery is not well demonstrated on this examination.  IMPRESSION: Enlargement of the left biceps musculature compatible with history of a hematoma. There is a 2.5 cm hyperdense area around the left brachial vein which could be related to the hematoma but difficult to exclude a pseudoaneurysm in this area. This area could be further characterized with a vascular ultrasound.  Assessment/Planning:   OR today for left brachial pseudoaneurysm repair.    Lori Pascalhristopher J Kadedra Vanaken J. Shacara Cozine, MD [facility] [place] Office: [phone] Pager: [phone]

## 2018-05-20 NOTE — Transfer of Care (Signed)
Immediate Anesthesia Transfer of Care Note  Patient: Lori Gutierrez  Procedure(s) Performed: BRACHIAL ARTERY PSEUDOANEURYSM REPAIR WITH HEMATOMA EVACUATION (Left Arm Upper)  Patient Location: PACU  Anesthesia Type:General  Level of Consciousness: awake and alert   Airway & Oxygen Therapy: Patient Spontanous Breathing and Patient connected to tracheostomy mask oxygen  Post-op Assessment: Report given to RN, Post -op Vital signs reviewed and stable and Patient moving all extremities X 4  Post vital signs: Reviewed and stable  Last Vitals:  Vitals Value Taken Time  BP 175/90 06/23/2018  4:52 PM  Temp 37.2 C 06/23/2018  4:52 PM  Pulse 92 06/23/2018  5:00 PM  Resp 13 06/23/2018  5:00 PM  SpO2 93 % 06/23/2018  5:00 PM  Vitals shown include unvalidated device data.  Last Pain:  Vitals:   2017-11-28 1652  PainSc: (P) 0-No pain         Complications: No apparent anesthesia complications

## 2018-05-20 NOTE — Progress Notes (Signed)
Pulmonary Critical Care Medicine [facility] [place]   PULMONARY SERVICE  PROGRESS NOTE  Date of Service: 06/07/18  Lori Gutierrez  ZOX:[id number]  DOB: 01-25-1981   DOA: 04/30/2018  Referring Physician: Carron Curie, MD  HPI: Lori Gutierrez is a 37 y.o. female seen for follow up of Acute on Chronic Respiratory Failure.  She remains on the ventilator full support right now patient has multiple procedures scheduled in addition she is been having some coffee-ground so therefore the weaning is being held.  Medications: Reviewed on Rounds  Physical Exam:  Vitals: Temperature 98.9 pulse 79 respiratory rate 20 blood pressure 185/99 saturations 90%  Ventilator Settings mode of ventilation assist control FiO2 350 PEEP 5 tidal volume is 310  . General: Comfortable at this time . Eyes: Grossly normal lids, irises & conjunctiva . ENT: grossly tongue is normal . Neck: no obvious mass . Cardiovascular: S1 S2 normal no gallop . Respiratory: Good air entry scattered few rhonchi . Abdomen: soft . Skin: no rash seen on limited exam . Musculoskeletal: not rigid . Psychiatric:unable to assess . Neurologic: no seizure no involuntary movements         Lab Data:   Basic Metabolic Panel: Recent Labs  Lab 05/15/18 0610 05/18/18 0743 06/07/2018 0439  NA 129* 129* 132*  K 4.1 4.5 3.8  CL 92* 89* 94*  CO2 25 24 24   GLUCOSE 136* 119* 89  BUN 70* 88* 45*  CREATININE 2.74* 3.49* 3.07*  CALCIUM 9.0 8.8* 8.5*  PHOS 3.8 5.4* 4.6    Liver Function Tests: Recent Labs  Lab 05/15/18 0610 05/18/18 0743 06-07-18 0439  ALBUMIN 2.7* 2.7* 2.3*   No results for input(s): LIPASE, AMYLASE in the last 168 hours. No results for input(s): AMMONIA in the last 168 hours.  CBC: Recent Labs  Lab 05/15/18 0610 05/18/18 0743 05/19/18 0822 05/19/18 2250 2018-06-07 0439  WBC 16.6* 13.6* 18.8* 17.1* 14.8*  HGB 8.5* 8.5* 9.2* 8.2* 8.2*  HCT 27.1* 27.8* 29.8* 25.7* 26.7*  MCV 90.0 89.7 88.4  88.3 89.6  PLT 337 460* 549* 512* 523*    Cardiac Enzymes: No results for input(s): CKTOTAL, CKMB, CKMBINDEX, TROPONINI in the last 168 hours.  BNP (last 3 results) No results for input(s): BNP in the last 8760 hours.  ProBNP (last 3 results) No results for input(s): PROBNP in the last 8760 hours.  Radiological Exams: Ct Chest W Contrast  Result Date: 05/19/2018 CLINICAL DATA:  Interstitial lung disease. Acute on chronic respiratory failure. Hypoxia. Inpatient. EXAM: CT CHEST WITH CONTRAST TECHNIQUE: Multidetector CT imaging of the chest was performed during intravenous contrast administration. CONTRAST:  75mL ISOVUE-300 IOPAMIDOL (ISOVUE-300) INJECTION 61% COMPARISON:  05/11/2018 chest radiograph.  12/15/2017 chest CT. FINDINGS: Cardiovascular: Stable mild-to-moderate cardiomegaly. No significant pericardial effusion/thickening. Left internal jugular central venous catheter terminates in the right atrium. Normal course and caliber of the thoracic aorta. Stable borderline prominent main pulmonary artery (3.3 cm diameter). No central pulmonary emboli. Mediastinum/Nodes: No discrete thyroid nodules. Enteric tube enters the stomach with the tip not seen on this study. New mild left axillary adenopathy measures up to the 1.0 cm (series 3/image 68). New mild right axillary adenopathy measures up to 1.0 cm (series 3/image 64). New mild left supraclavicular adenopathy measures up to 1.1 cm (series 3/image 25). No right axillary adenopathy. Right paratracheal adenopathy up to 1.5 cm (series 3/image 50), previously 1.3 cm, mildly increased. Mildly enlarged 1.1 cm subcarinal node (series 3/image 62), stable. No hilar adenopathy. Lungs/Pleura: Tracheostomy  tube terminates in tracheal lumen at the level of the thoracic inlet. No pneumothorax. Trace dependent right pleural effusion. Small dependent left pleural effusion. There is patchy consolidation and volume loss throughout most of the left lower lobe. There  is less prominent patchy bandlike consolidation with some volume loss throughout the bilateral upper lobes and right lower lobe. Mild patchy ground-glass opacity and mild interlobular septal thickening throughout both lungs. No discrete lung masses or significant pulmonary nodules. No significant regions of traction bronchiectasis, architectural distortion or frank honeycombing. Upper abdomen: No acute abnormality. Musculoskeletal: No aggressive appearing focal osseous lesions. There are fractures of the anterior left first through fifth ribs, which appear new since 12/15/2017 chest CT, which appear subacute and healing. IMPRESSION: 1. Fractures of the anterior left first through fifth ribs, new since 12/15/2017 CT, which appear subacute and healing. Correlate with injury mechanism, such as possibly CPR. 2. Stable cardiomegaly. Small left and trace right dependent pleural effusions. 3. Mild patchy ground-glass opacity throughout both lungs with mild interlobular septal thickening, suggesting mild pulmonary edema. 4. Patchy consolidation and volume loss throughout both lungs, most prominent in the left lower lobe, favor predominantly atelectasis, difficult to exclude a component of pneumonia. No specific findings of interstitial lung disease. 5. Mediastinal and bilateral axillary adenopathy, mildly increased, nonspecific. 6. Stable borderline dilated main pulmonary artery, suggesting chronic pulmonary arterial hypertension. 7. Well-positioned support structures. Electronically Signed   By: Delbert PhenixJason A Poff M.D.   On: 05/19/2018 08:23   Dg Chest Port 1 View  Result Date: 05/19/2018 CLINICAL DATA:  Cough EXAM: PORTABLE CHEST 1 VIEW COMPARISON:  Chest radiograph May 11, 2018 and chest CT May 19, 2018 FINDINGS: Tracheostomy catheter tip is 3.0 cm above the carina. Central catheter tip is at the cavoatrial junction. Nasogastric tube tip and side port are below the diaphragm. No pneumothorax. There is a moderate  pleural effusion on the left with patchy airspace opacity throughout the left lung, most notably in the left mid and lower lung zones. There is a degree of underlying interstitial edema. There is no consolidation on the right. There is cardiomegaly with pulmonary venous hypertension. There is no adenopathy. There are several mildly displaced rib fractures on the right, better seen on CT. IMPRESSION: Tube and catheter positions as described without pneumothorax. There is evidence of pulmonary vascular congestion with interstitial edema. There may well be congestive heart failure. Patchy opacity on the left is due to atelectasis with potential lung contusion given several recent fractures on the left. There is a small left pleural effusion. Electronically Signed   By: Bretta BangWilliam  Woodruff III M.D.   On: 05/19/2018 12:29    Assessment/Plan Principal Problem:   Acute on chronic respiratory failure with hypoxia (HCC) Active Problems:   Aspiration pneumonia (HCC)   Seizure disorder (HCC)   Cardiac arrest (HCC)   End stage renal disease on dialysis (HCC)   Chronic systolic heart failure (HCC)   1. Acute on chronic respiratory failure with hypoxia we will continue with full support on the assist control mode.  Titrate oxygen down as tolerated continue pulmonary toilet and follow 2. Aspiration pneumonia treated we will continue to follow 3. Seizure disorder no active seizures noted. 4. Status post cardiac arrest treated we will continue to follow 5. End-stage renal disease followed by nephrology for dialysis 6. Chronic systolic heart failure compensated we will continue with present management   I have personally seen and evaluated the patient, evaluated laboratory and imaging results, formulated the assessment and  plan and placed orders. The Patient requires high complexity decision making for assessment and support.  Case was discussed on Rounds with the Respiratory Therapy Staff  Yevonne PaxSaadat A Leah Skora, MD  [facility] Pulmonary Critical Care Medicine Sleep Medicine

## 2018-05-20 NOTE — Anesthesia Procedure Notes (Signed)
Date/Time: 05/12/2018 1:49 PM Performed by: Quentin OreWalker, Doriann Zuch E, CRNA Pre-anesthesia Checklist: Patient identified, Emergency Drugs available, Suction available, Patient being monitored and Timeout performed Patient Re-evaluated:Patient Re-evaluated prior to induction Oxygen Delivery Method: Circle system utilized Induction Type: Tracheostomy and Inhalational induction Placement Confirmation: positive ETCO2 and breath sounds checked- equal and bilateral Dental Injury: Teeth and Oropharynx as per pre-operative assessment

## 2018-05-20 NOTE — Progress Notes (Signed)
[facility]  ROUNDING NOTE   Subjective:  Patient for pseudoaneurysm repair today. She did undergo dialysis today. In addition she had hematemesis recently.   Objective:  Vital signs in last 24 hours:  Temperature 98.9 pulse 79 respirations 20 blood pressure 185/94  Physical Exam: General: Critically ill appearing  Head: [place]/AT NG in place  Eyes: Anicteric  Neck: Tracheostomy in place  Lungs:  Scattered rhonchi, vent assisted  Heart: S1S2 no rubs  Abdomen:  Soft, nontender, bowel sounds present  Extremities: trace peripheral edema.  Neurologic: Awake, follows commands  Skin: No lesions  Access: L IJ permcath    Basic Metabolic Panel: Recent Labs  Lab 05/15/18 0610 05/18/18 0743 05/10/2018 0439  NA 129* 129* 132*  K 4.1 4.5 3.8  CL 92* 89* 94*  CO2 25 24 24   GLUCOSE 136* 119* 89  BUN 70* 88* 45*  CREATININE 2.74* 3.49* 3.07*  CALCIUM 9.0 8.8* 8.5*  PHOS 3.8 5.4* 4.6    Liver Function Tests: Recent Labs  Lab 05/15/18 0610 05/18/18 0743 05/15/2018 0439  ALBUMIN 2.7* 2.7* 2.3*   No results for input(s): LIPASE, AMYLASE in the last 168 hours. No results for input(s): AMMONIA in the last 168 hours.  CBC: Recent Labs  Lab 05/15/18 0610 05/18/18 0743 05/19/18 0822 05/19/18 2250 05/17/2018 0439  WBC 16.6* 13.6* 18.8* 17.1* 14.8*  HGB 8.5* 8.5* 9.2* 8.2* 8.2*  HCT 27.1* 27.8* 29.8* 25.7* 26.7*  MCV 90.0 89.7 88.4 88.3 89.6  PLT 337 460* 549* 512* 523*    Cardiac Enzymes: No results for input(s): CKTOTAL, CKMB, CKMBINDEX, TROPONINI in the last 168 hours.  BNP: Invalid input(s): POCBNP  CBG: No results for input(s): GLUCAP in the last 168 hours.  Microbiology: Results for orders placed or performed during the hospital encounter of 04/30/18  Culture, respiratory (non-expectorated)     Status: None   Collection Time: 05/01/18  7:52 AM  Result Value Ref Range Status   Specimen Description TRACHEAL ASPIRATE  Final   Special Requests NONE   Final   Gram Stain   Final    ABUNDANT WBC PRESENT,BOTH PMN AND MONONUCLEAR NO ORGANISMS SEEN    Culture   Final    RARE Consistent with normal respiratory flora. Performed at [facility], [address], [place], [place] [address]    Report Status 05/03/2018 FINAL  Final  Culture, respiratory (non-expectorated)     Status: None   Collection Time: 05/13/18  1:50 PM  Result Value Ref Range Status   Specimen Description TRACHEAL ASPIRATE  Final   Special Requests NONE  Final   Gram Stain   Final    ABUNDANT WBC PRESENT, PREDOMINANTLY PMN RARE GRAM POSITIVE COCCI    Culture   Final    RARE Consistent with normal respiratory flora. Performed at [facility], [address] [place], [place] [address]    Report Status 05/15/2018 FINAL  Final  Culture, blood (routine x 2)     Status: None (Preliminary result)   Collection Time: 05/17/18  4:16 PM  Result Value Ref Range Status   Specimen Description BLOOD RIGHT HAND  Final   Special Requests   Final    BOTTLES DRAWN AEROBIC ONLY Blood Culture adequate volume   Culture   Final    NO GROWTH 3 DAYS Performed at [facility], [address], [place], [place] [address]    Report Status PENDING  Incomplete  Culture, blood (routine x 2)  Status: None (Preliminary result)   Collection Time: 05/17/18  4:22 PM  Result Value Ref Range Status   Specimen Description BLOOD RIGHT HAND  Final   Special Requests   Final    BOTTLES DRAWN AEROBIC ONLY Blood Culture adequate volume   Culture   Final    NO GROWTH 3 DAYS Performed at [facility], [address], [place], [place] [address]    Report Status PENDING  Incomplete  Culture, respiratory (non-expectorated)     Status: None (Preliminary result)   Collection Time: 05/19/18 11:38 AM  Result Value Ref Range Status   Specimen Description TRACHEAL ASPIRATE  Final   Special Requests NONE  Final   Gram Stain   Final    NO WBC SEEN RARE GRAM POSITIVE COCCI IN  PAIRS IN CLUSTERS RARE BUDDING YEAST SEEN    Culture   Final    CULTURE REINCUBATED FOR BETTER GROWTH Performed at [facility], [address], [place], [place] [address]    Report Status PENDING  Incomplete  Culture, blood (routine x 2)     Status: None (Preliminary result)   Collection Time: 05/19/18 11:52 AM  Result Value Ref Range Status   Specimen Description BLOOD LEFT HAND  Final   Special Requests   Final    BOTTLES DRAWN AEROBIC ONLY Blood Culture adequate volume   Culture   Final    NO GROWTH 1 DAY Performed at [facility], [address] [place], [place] [address]    Report Status PENDING  Incomplete  Culture, blood (routine x 2)     Status: None (Preliminary result)   Collection Time: 05/19/18 11:56 AM  Result Value Ref Range Status   Specimen Description BLOOD LEFT WRIST  Final   Special Requests   Final    BOTTLES DRAWN AEROBIC ONLY Blood Culture adequate volume   Culture   Final    NO GROWTH 1 DAY Performed at [facility], [address] [place], [place] [address]    Report Status PENDING  Incomplete    Coagulation Studies: No results for input(s): LABPROT, INR in the last 72 hours.  Urinalysis: No results for input(s): COLORURINE, LABSPEC, PHURINE, GLUCOSEU, HGBUR, BILIRUBINUR, KETONESUR, PROTEINUR, UROBILINOGEN, NITRITE, LEUKOCYTESUR in the last 72 hours.  Invalid input(s): APPERANCEUR    Imaging: Ct Chest W Contrast  Result Date: 05/19/2018 CLINICAL DATA:  Interstitial lung disease. Acute on chronic respiratory failure. Hypoxia. Inpatient. EXAM: CT CHEST WITH CONTRAST TECHNIQUE: Multidetector CT imaging of the chest was performed during intravenous contrast administration. CONTRAST:  75mL ISOVUE-300 IOPAMIDOL (ISOVUE-300) INJECTION 61% COMPARISON:  05/11/2018 chest radiograph.  12/15/2017 chest CT. FINDINGS: Cardiovascular: Stable mild-to-moderate cardiomegaly. No significant pericardial effusion/thickening. Left internal jugular  central venous catheter terminates in the right atrium. Normal course and caliber of the thoracic aorta. Stable borderline prominent main pulmonary artery (3.3 cm diameter). No central pulmonary emboli. Mediastinum/Nodes: No discrete thyroid nodules. Enteric tube enters the stomach with the tip not seen on this study. New mild left axillary adenopathy measures up to the 1.0 cm (series 3/image 68). New mild right axillary adenopathy measures up to 1.0 cm (series 3/image 64). New mild left supraclavicular adenopathy measures up to 1.1 cm (series 3/image 25). No right axillary adenopathy. Right paratracheal adenopathy up to 1.5 cm (series 3/image 50), previously 1.3 cm, mildly increased. Mildly enlarged 1.1 cm subcarinal node (series 3/image 62), stable. No hilar adenopathy. Lungs/Pleura: Tracheostomy tube terminates in tracheal lumen at the  level of the thoracic inlet. No pneumothorax. Trace dependent right pleural effusion. Small dependent left pleural effusion. There is patchy consolidation and volume loss throughout most of the left lower lobe. There is less prominent patchy bandlike consolidation with some volume loss throughout the bilateral upper lobes and right lower lobe. Mild patchy ground-glass opacity and mild interlobular septal thickening throughout both lungs. No discrete lung masses or significant pulmonary nodules. No significant regions of traction bronchiectasis, architectural distortion or frank honeycombing. Upper abdomen: No acute abnormality. Musculoskeletal: No aggressive appearing focal osseous lesions. There are fractures of the anterior left first through fifth ribs, which appear new since 12/15/2017 chest CT, which appear subacute and healing. IMPRESSION: 1. Fractures of the anterior left first through fifth ribs, new since 12/15/2017 CT, which appear subacute and healing. Correlate with injury mechanism, such as possibly CPR. 2. Stable cardiomegaly. Small left and trace right dependent  pleural effusions. 3. Mild patchy ground-glass opacity throughout both lungs with mild interlobular septal thickening, suggesting mild pulmonary edema. 4. Patchy consolidation and volume loss throughout both lungs, most prominent in the left lower lobe, favor predominantly atelectasis, difficult to exclude a component of pneumonia. No specific findings of interstitial lung disease. 5. Mediastinal and bilateral axillary adenopathy, mildly increased, nonspecific. 6. Stable borderline dilated main pulmonary artery, suggesting chronic pulmonary arterial hypertension. 7. Well-positioned support structures. Electronically Signed   By: Delbert PhenixJason A Poff M.D.   On: 05/19/2018 08:23   Dg Chest Port 1 View  Result Date: 05/19/2018 CLINICAL DATA:  Cough EXAM: PORTABLE CHEST 1 VIEW COMPARISON:  Chest radiograph May 11, 2018 and chest CT May 19, 2018 FINDINGS: Tracheostomy catheter tip is 3.0 cm above the carina. Central catheter tip is at the cavoatrial junction. Nasogastric tube tip and side port are below the diaphragm. No pneumothorax. There is a moderate pleural effusion on the left with patchy airspace opacity throughout the left lung, most notably in the left mid and lower lung zones. There is a degree of underlying interstitial edema. There is no consolidation on the right. There is cardiomegaly with pulmonary venous hypertension. There is no adenopathy. There are several mildly displaced rib fractures on the right, better seen on CT. IMPRESSION: Tube and catheter positions as described without pneumothorax. There is evidence of pulmonary vascular congestion with interstitial edema. There may well be congestive heart failure. Patchy opacity on the left is due to atelectasis with potential lung contusion given several recent fractures on the left. There is a small left pleural effusion. Electronically Signed   By: Bretta BangWilliam  Woodruff III M.D.   On: 05/19/2018 12:29     Medications:       Assessment/ Plan:   37 y.o. female with a PMHx of recent acute respiratory failure requiring intubation, aspiration pneumonia, cardiac arrest, chronic systolic heart failure, delay of cognitive development, end-stage renal disease on hemodialysis MWF, hypertension, seizure disorder, who was admitted to [facility] on 04/30/2018 for ongoing management of acute respiratory failure, recent PEA arrest, and end-stage renal disease.   1.  ESRD on HD.    Patient has completed dialysis today.  We will plan for dialysis again on Friday.  2.  Acute respiratory failure.  Secondary to aspiration.   -She continues to tolerate aerosolized trach collar well.  Continue current respiratory support.  3.  Anemia of chronic kidney disease.  Hemoglobin currently 8.2.  No indication for blood transfusion at the moment.  4.  Secondary hyperparathyroidism.  Phosphorus within the target range at 4.6.  Repeat  on Friday.  5.  Hypertension:   Patient continues to have labile blood pressure.  Discussed in depth with hospitalist.  Consider adding either doxazosin or minoxidil.   LOS: 0 Lori Gutierrez 8/21/20191:29 PM

## 2018-05-20 NOTE — Op Note (Signed)
Date: May 20, 2018  Preoperative diagnosis: Left brachial artery pseudoaneurysm  Postoperative diagnosis: Left brachial artery pseudoaneurysm  Procedure: 1.  Left brachial artery pseudoaneurysm repair 2.  Evacuation of left arm hematoma  Surgeon: Dr. Sherald Hesshristopher Khalen Styer, MD  Indications: Patient is a 37 year old female who had a recent cardiac arrest at a referring hospital and is now on full trach support.  Vascular surgery was consulted after it was noted that the patient had persistent edema and cellulitis of her left arm and a duplex was obtained that was concerning for hematoma with possible underlying pseudoaneurysm.  Her arm had reportedly been swollen for over one month prior to our evaluation.  We subsequently obtained a CTA of her left upper extremity that further supported the suspicion of a underlying pseudoaneurysm.  After informed consent was obtained from the state, we are taking her to the operating room today for pseudoaneurysm repair.  Findings: There was significant inflammation in the soft tissues of her left arm and ultimately we identified a pseudoaneurysm off the distal brachial artery at the level of the elbow.  This was repaired with multiple interrupted Prolene sutures placed in the wall of the brachial artery closing it in transverse fashion.  The wall of the artery was extremely tenuous.  During repair of the artery it started falling apart, but ultimately we were able to complete the repair.  Anesthesia: General  Details: The patient was taken to the operating room after informed consent was obtained by the state.  Ultimately she was placed supine on the table and her left arm was prepped and draped in the usual sterile fashion.  We initially used ultrasound guidance to identify the brachial artery that was marked on the skin as well as the large cavity of hematoma that we thought was associated with the pseudoaneurysm.  We initially made a longitudinal incision on the  upper arm and dissected through the subcutaneous tissue and identified the brachial artery that was dissected away from the nerve and controlled with a vessel loop.  We then carried our dissection distally on the arm toward the antecubital fossa.  All of the soft tissue surrounding the brachial artery as well as the veins and nerves were extremely edematous and inflamed.  Ultimately we encountered a large hematoma cavity that we began evacuating.  During this evacuation we then encountered bright red pulsatile bleeding underneath the old clot.  We placed a finger on the hole that was found in the distal brachial artery.  We then were able to use blunt dissection to get a vessel loop around the distal artery for distal control.  We did briefly inflate an arm tourniquet during this dissection for <5 minutes.  The patient was given 3000 units of IV heparin at this time.  We then tried to clean off the artery around the pseudoaneurysm in order to clearly identify what needed to be repaired.  We placed multiple 6-0 Prolenes in interrupted fashion in the wall of the artery closing it in transverse fashion to prevent narrowing it.  Unfortunately the artery was extremely friable and this required multiple sutures in order to get hemostasis.  We then irrigated the wound with saline until the effluent was clear.  Hemostasis was achieved with Bovie cautery.  We then tunneled a 10 French flat drain through the sub-cutaneous tissue and placed it over the repair.  The subcutaneous tissue was then closed in interrupted fashion with a running 3-0 Vicryl.  We placed interrupted 3-0 Nylons in the  skin using a horizontal mattress suture.  The patient had a radial as well as ulnar signal at the completion of the case.  She was taken to PACU in stable condition.  Complications none      Lori Shellinghristopher J. Antonisha Waskey, MD [facility] [place] Office: [phone] Pager: [phone]  Lori Gutierrez

## 2018-05-21 ENCOUNTER — Encounter (HOSPITAL_COMMUNITY): Payer: Self-pay | Admitting: Vascular Surgery

## 2018-05-21 ENCOUNTER — Other Ambulatory Visit (HOSPITAL_COMMUNITY): Payer: Self-pay

## 2018-05-21 DIAGNOSIS — J9621 Acute and chronic respiratory failure with hypoxia: Secondary | ICD-10-CM | POA: Diagnosis not present

## 2018-05-21 DIAGNOSIS — N186 End stage renal disease: Secondary | ICD-10-CM

## 2018-05-21 DIAGNOSIS — J690 Pneumonitis due to inhalation of food and vomit: Secondary | ICD-10-CM

## 2018-05-21 DIAGNOSIS — Z992 Dependence on renal dialysis: Secondary | ICD-10-CM

## 2018-05-21 DIAGNOSIS — I5022 Chronic systolic (congestive) heart failure: Secondary | ICD-10-CM | POA: Diagnosis not present

## 2018-05-21 DIAGNOSIS — G40909 Epilepsy, unspecified, not intractable, without status epilepticus: Secondary | ICD-10-CM

## 2018-05-21 DIAGNOSIS — I469 Cardiac arrest, cause unspecified: Secondary | ICD-10-CM

## 2018-05-21 LAB — CBC
HEMATOCRIT: 25.7 % — AB (ref 36.0–46.0)
Hemoglobin: 7.6 g/dL — ABNORMAL LOW (ref 12.0–15.0)
MCH: 27.4 pg (ref 26.0–34.0)
MCHC: 29.6 g/dL — AB (ref 30.0–36.0)
MCV: 92.8 fL (ref 78.0–100.0)
Platelets: 540 10*3/uL — ABNORMAL HIGH (ref 150–400)
RBC: 2.77 MIL/uL — ABNORMAL LOW (ref 3.87–5.11)
RDW: 19.3 % — AB (ref 11.5–15.5)
WBC: 13.5 10*3/uL — ABNORMAL HIGH (ref 4.0–10.5)

## 2018-05-21 LAB — CULTURE, RESPIRATORY W GRAM STAIN
Culture: NORMAL
Gram Stain: NONE SEEN

## 2018-05-21 NOTE — Progress Notes (Signed)
Pulmonary Critical Care Medicine [facility] [place]   PULMONARY SERVICE  PROGRESS NOTE  Date of Service: 05/21/2018  Lori Gutierrez  NWG:[id number]  DOB: [birth year]   DOA: 05/27/2018  Referring Physician: Carron Curie, MD  HPI: Lori Gutierrez is a 37 y.o. female seen for follow up of Acute on Chronic Respiratory Failure.  She is weaning on T collar this morning has been on 40% oxygen.  Yesterday she was supposed to do 16 hours but did not complete today with going to try again for 16-hour goal  Medications: Reviewed on Rounds  Physical Exam:  Vitals: Temperature 97.4 pulse 65 respiratory rate 25 blood pressure 164/87 saturations 100%  Ventilator Settings off the ventilator on T collar FiO2 40%  . General: Comfortable at this time . Eyes: Grossly normal lids, irises & conjunctiva . ENT: grossly tongue is normal . Neck: no obvious mass . Cardiovascular: S1 S2 normal no gallop . Respiratory: No rhonchi or rales are noted at this time. . Abdomen: soft . Skin: no rash seen on limited exam . Musculoskeletal: not rigid . Psychiatric:unable to assess . Neurologic: no seizure no involuntary movements         Lab Data:   Basic Metabolic Panel: Recent Labs  Lab 05/15/18 0610 05/18/18 0743 05/22/2018 0439  NA 129* 129* 132*  K 4.1 4.5 3.8  CL 92* 89* 94*  CO2 25 24 24   GLUCOSE 136* 119* 89  BUN 70* 88* 45*  CREATININE 2.74* 3.49* 3.07*  CALCIUM 9.0 8.8* 8.5*  PHOS 3.8 5.4* 4.6    Liver Function Tests: Recent Labs  Lab 05/15/18 0610 05/18/18 0743 05/04/2018 0439  ALBUMIN 2.7* 2.7* 2.3*   No results for input(s): LIPASE, AMYLASE in the last 168 hours. No results for input(s): AMMONIA in the last 168 hours.  CBC: Recent Labs  Lab 05/18/18 0743 05/19/18 0822 05/19/18 2250 05/25/2018 0439 05/21/18 0537  WBC 13.6* 18.8* 17.1* 14.8* 13.5*  HGB 8.5* 9.2* 8.2* 8.2* 7.6*  HCT 27.8* 29.8* 25.7* 26.7* 25.7*  MCV 89.7 88.4 88.3 89.6 92.8  PLT 460* 549* 512*  523* 540*    Cardiac Enzymes: No results for input(s): CKTOTAL, CKMB, CKMBINDEX, TROPONINI in the last 168 hours.  BNP (last 3 results) No results for input(s): BNP in the last 8760 hours.  ProBNP (last 3 results) No results for input(s): PROBNP in the last 8760 hours.  Radiological Exams: Dg Forearm Right  Result Date: 05/21/2018 CLINICAL DATA:  Right forearm fracture. EXAM: RIGHT FOREARM - 2 VIEW COMPARISON:  03/23/2018. FINDINGS: Displaced, comminuted, angulated fracture of the distal ulna is again noted. Some degree of callus formation is noted. Radius is intact. IMPRESSION: Displaced, comminuted, angulated fracture of the distal ulna is again noted. Some degree of callus formation on today's exam. Electronically Signed   By: Maisie Fus  Register   On: 05/21/2018 13:48    Assessment/Plan Principal Problem:   Acute on chronic respiratory failure with hypoxia (HCC) Active Problems:   Aspiration pneumonia (HCC)   Seizure disorder (HCC)   Cardiac arrest (HCC)   End stage renal disease on dialysis (HCC)   Chronic systolic heart failure (HCC)   1. Acute on chronic respiratory failure with hypoxia we will continue to wean hopefully we should be able to hit 16 hours on the T collar. 2. Pneumonia due to aspiration treated we will continue present management. 3. Seizure disorder no active seizures noted. 4. Cardiac arrest stable 5. End-stage renal disease on dialysis followed by nephrology.  6. Chronic systolic heart failure compensated continue present management   I have personally seen and evaluated the patient, evaluated laboratory and imaging results, formulated the assessment and plan and placed orders. The Patient requires high complexity decision making for assessment and support.  Case was discussed on Rounds with the Respiratory Therapy Staff  Yevonne PaxSaadat A Khan, MD [facility] Pulmonary Critical Care Medicine Sleep Medicine

## 2018-05-21 NOTE — Progress Notes (Addendum)
  Progress Note    05/21/2018 11:11 AM  Subjective:  No events overnight   Physical Exam: Lungs:  Mechanical ventilation Incisions:  L arm dressing left in place; <10cc currently in bulb suction Extremities:  Palpable L radial pulse; sensation and grip strength intact Abdomen:  Soft Neurologic: baseline  CBC    Component Value Date/Time   WBC 13.5 (H) 05/21/2018 0537   RBC 2.77 (L) 05/21/2018 0537   HGB 7.6 (L) 05/21/2018 0537   HCT 25.7 (L) 05/21/2018 0537   PLT 540 (H) 05/21/2018 0537   MCV 92.8 05/21/2018 0537   MCH 27.4 05/21/2018 0537   MCHC 29.6 (L) 05/21/2018 0537   RDW 19.3 (H) 05/21/2018 0537   LYMPHSABS 0.7 05/01/2018 0506   MONOABS 1.1 (H) 05/01/2018 0506   EOSABS 0.5 05/01/2018 0506   BASOSABS 0.1 05/01/2018 0506    BMET    Component Value Date/Time   NA 132 (L) 05/25/2018 0439   K 3.8 05/16/2018 0439   CL 94 (L) 05/09/2018 0439   CO2 24 05/29/2018 0439   GLUCOSE 89 05/13/2018 0439   BUN 45 (H) 05/25/2018 0439   CREATININE 3.07 (H) 05/02/2018 0439   CALCIUM 8.5 (L) 05/10/2018 0439   CALCIUM 7.2 (L) 05/01/2018 2346   GFRNONAA 18 (L) 05/07/2018 0439   GFRAA 21 (L) 05/30/2018 0439    INR    Component Value Date/Time   INR 1.59 05/01/2018 0506    No intake or output data in the 24 hours ending 05/21/18 1111   Assessment/Plan:  37 y.o. female is s/p evacuation of L arm hematoma and brachial artery pseudoaneurysm repair POD#1  Perfusing L hand with palpable radial pulse and grip strength and sensation intact Dressing down tomorrow; possibly d/c bulb suction drain tomorrow Medical management per primary service  Emilie RutterMatthew Eveland, PA-C [facility] [phone] 05/21/2018 11:11 AM  .I have seen and evaluated the patient. I agree with the PA note as documented above. Pseudoaneurysm repaired yesterday.  Will monitor drain output.  Palpable radial pulse in left hand.  Hand warm.   Cephus Shellinghristopher J. Eloisa Chokshi, MD [facility] [place] Office: [phone] Pager: [phone]

## 2018-05-22 DIAGNOSIS — I5022 Chronic systolic (congestive) heart failure: Secondary | ICD-10-CM | POA: Diagnosis not present

## 2018-05-22 DIAGNOSIS — J690 Pneumonitis due to inhalation of food and vomit: Secondary | ICD-10-CM | POA: Diagnosis not present

## 2018-05-22 DIAGNOSIS — I469 Cardiac arrest, cause unspecified: Secondary | ICD-10-CM | POA: Diagnosis not present

## 2018-05-22 DIAGNOSIS — J9621 Acute and chronic respiratory failure with hypoxia: Secondary | ICD-10-CM | POA: Diagnosis not present

## 2018-05-22 LAB — CBC
HCT: 21.9 % — ABNORMAL LOW (ref 36.0–46.0)
HEMOGLOBIN: 6.5 g/dL — AB (ref 12.0–15.0)
MCH: 27.5 pg (ref 26.0–34.0)
MCHC: 29.7 g/dL — AB (ref 30.0–36.0)
MCV: 92.8 fL (ref 78.0–100.0)
Platelets: 475 10*3/uL — ABNORMAL HIGH (ref 150–400)
RBC: 2.36 MIL/uL — ABNORMAL LOW (ref 3.87–5.11)
RDW: 18.9 % — AB (ref 11.5–15.5)
WBC: 15 10*3/uL — ABNORMAL HIGH (ref 4.0–10.5)

## 2018-05-22 LAB — CULTURE, BLOOD (ROUTINE X 2)
CULTURE: NO GROWTH
Culture: NO GROWTH
Special Requests: ADEQUATE
Special Requests: ADEQUATE

## 2018-05-22 LAB — RENAL FUNCTION PANEL
ALBUMIN: 2.3 g/dL — AB (ref 3.5–5.0)
ANION GAP: 10 (ref 5–15)
BUN: 14 mg/dL (ref 6–20)
CALCIUM: 8.1 mg/dL — AB (ref 8.9–10.3)
CO2: 27 mmol/L (ref 22–32)
Chloride: 98 mmol/L (ref 98–111)
Creatinine, Ser: 1.18 mg/dL — ABNORMAL HIGH (ref 0.44–1.00)
GFR calc Af Amer: >60 mL/min (ref >60)
GFR calc non Af Amer: 59 mL/min — ABNORMAL LOW (ref >60)
GLUCOSE: 107 mg/dL — AB (ref 70–99)
POTASSIUM: 3.4 mmol/L — AB (ref 3.5–5.1)
Phosphorus: 2 mg/dL — ABNORMAL LOW (ref 2.5–4.6)
SODIUM: 135 mmol/L (ref 135–145)

## 2018-05-22 LAB — HEMOGLOBIN A1C
HEMOGLOBIN A1C: 4.8 % (ref 4.8–5.6)
Mean Plasma Glucose: 91.06 mg/dL

## 2018-05-22 LAB — OCCULT BLOOD X 1 CARD TO LAB, STOOL: FECAL OCCULT BLD: NEGATIVE

## 2018-05-22 LAB — PREPARE RBC (CROSSMATCH)

## 2018-05-22 NOTE — Progress Notes (Signed)
  Progress Note    05/22/2018 10:10 AM  Subjective:  No events overnight   Physical Exam: Lungs:  Mechanical ventilation Incisions:  L arm dressing left in place, bulb suction near empty Extremities:  Palpable L radial pulse; sensation and grip strength intact   CBC    Component Value Date/Time   WBC 15.0 (H) 05/22/2018 0613   RBC 2.36 (L) 05/22/2018 0613   HGB 6.5 (LL) 05/22/2018 0613   HCT 21.9 (L) 05/22/2018 0613   PLT 475 (H) 05/22/2018 0613   MCV 92.8 05/22/2018 0613   MCH 27.5 05/22/2018 0613   MCHC 29.7 (L) 05/22/2018 0613   RDW 18.9 (H) 05/22/2018 0613   LYMPHSABS 0.7 05/01/2018 0506   MONOABS 1.1 (H) 05/01/2018 0506   EOSABS 0.5 05/01/2018 0506   BASOSABS 0.1 05/01/2018 0506    BMET    Component Value Date/Time   NA 132 (L) 06/30/2018 0439   K 3.8 06/30/2018 0439   CL 94 (L) 06/30/2018 0439   CO2 24 06/30/2018 0439   GLUCOSE 89 06/30/2018 0439   BUN 45 (H) 06/30/2018 0439   CREATININE 3.07 (H) 06/30/2018 0439   CALCIUM 8.5 (L) 06/30/2018 0439   CALCIUM 7.2 (L) 05/01/2018 2346   GFRNONAA 18 (L) 06/30/2018 0439   GFRAA 21 (L) 06/30/2018 0439    INR    Component Value Date/Time   INR 1.59 05/01/2018 0506    No intake or output data in the 24 hours ending 05/22/18 1010   Assessment/Plan:  37 y.o. female is s/p evacuation of L arm hematoma and brachial artery pseudoaneurysm repair   Left hand well perfused.  Dressing and drain removed today.  Will keep nylon sutures for 3-4 weeks.  Call with questions or concerns.  Cephus Shellinghristopher J. Brenlyn Beshara, MD [facility] [place] Office: [phone] Pager: [phone]  Cephus Shellinghristopher J Doha Boling

## 2018-05-22 NOTE — Progress Notes (Signed)
Pulmonary Critical Care Medicine [facility] [place]   PULMONARY CRITICAL CARE SERVICE  PROGRESS NOTE  Date of Service: 05/22/2018  Lori NobleMegan C Gutierrez  BMW:[id number]  DOB: August 11, 1981   DOA: 05/19/2018  Referring Physician: Carron CurieAli Hijazi, MD  HPI: Lori Gutierrez is a 37 y.o. female seen for follow up of Acute on Chronic Respiratory Failure.  Patient is on T collar oxygen is at 35% FiO2.  The goal today is for about 20 hours  Medications: Reviewed on Rounds  Physical Exam:  Vitals: Temperature 97.6 pulse 65 respiratory rate 22 blood pressure 134/76 saturations 97%  Ventilator Settings currently off the ventilator on T collar FiO2 35%  . General: Comfortable at this time . Eyes: Grossly normal lids, irises & conjunctiva . ENT: grossly tongue is normal . Neck: no obvious mass . Cardiovascular: S1 S2 normal no gallop . Respiratory: No rhonchi no rales are noted . Abdomen: soft . Skin: no rash seen on limited exam . Musculoskeletal: not rigid . Psychiatric:unable to assess . Neurologic: no seizure no involuntary movements         Lab Data:   Basic Metabolic Panel: Recent Labs  Lab 05/18/18 0743 05/03/2018 0439  NA 129* 132*  K 4.5 3.8  CL 89* 94*  CO2 24 24  GLUCOSE 119* 89  BUN 88* 45*  CREATININE 3.49* 3.07*  CALCIUM 8.8* 8.5*  PHOS 5.4* 4.6    Liver Function Tests: Recent Labs  Lab 05/18/18 0743 05/19/2018 0439  ALBUMIN 2.7* 2.3*   No results for input(s): LIPASE, AMYLASE in the last 168 hours. No results for input(s): AMMONIA in the last 168 hours.  CBC: Recent Labs  Lab 05/19/18 0822 05/19/18 2250 05/17/2018 0439 05/21/18 0537 05/22/18 0613  WBC 18.8* 17.1* 14.8* 13.5* 15.0*  HGB 9.2* 8.2* 8.2* 7.6* 6.5*  HCT 29.8* 25.7* 26.7* 25.7* 21.9*  MCV 88.4 88.3 89.6 92.8 92.8  PLT 549* 512* 523* 540* 475*    Cardiac Enzymes: No results for input(s): CKTOTAL, CKMB, CKMBINDEX, TROPONINI in the last 168 hours.  BNP (last 3 results) No results for  input(s): BNP in the last 8760 hours.  ProBNP (last 3 results) No results for input(s): PROBNP in the last 8760 hours.  Radiological Exams: Dg Forearm Right  Result Date: 05/21/2018 CLINICAL DATA:  Right forearm fracture. EXAM: RIGHT FOREARM - 2 VIEW COMPARISON:  03/23/2018. FINDINGS: Displaced, comminuted, angulated fracture of the distal ulna is again noted. Some degree of callus formation is noted. Radius is intact. IMPRESSION: Displaced, comminuted, angulated fracture of the distal ulna is again noted. Some degree of callus formation on today's exam. Electronically Signed   By: Maisie Fushomas  Register   On: 05/21/2018 13:48    Assessment/Plan Principal Problem:   Acute on chronic respiratory failure with hypoxia (HCC) Active Problems:   Aspiration pneumonia (HCC)   Seizure disorder (HCC)   Cardiac arrest (HCC)   End stage renal disease on dialysis (HCC)   Chronic systolic heart failure (HCC)   1. Acute on chronic respiratory failure with hypoxia we will continue with weaning on T collar goal 20 hours 2. Aspiration pneumonia clinically is improved we will continue with supportive care. 3. Seizure disorder no active seizures 4. Cardiac arrest stable rhythm will monitor 5. End-stage renal disease on dialysis followed by nephrology 6. Chronic systolic heart failure compensated   I have personally seen and evaluated the patient, evaluated laboratory and imaging results, formulated the assessment and plan and placed orders. The Patient requires high complexity  decision making for assessment and support.  Case was discussed on Rounds with the Respiratory Therapy Staff  Allyne Gee, MD [facility] Pulmonary Critical Care Medicine Sleep Medicine

## 2018-05-22 NOTE — Progress Notes (Signed)
[facility]  ROUNDING NOTE   Subjective:  Patient seen and evaluated during hemodialysis. Tolerating well. Receiving blood transfusion today.   Objective:  Vital signs in last 24 hours:  Temperature 97.3 pulse 63 respirations 19 blood pressure 120/78  Physical Exam: General: Critically ill appearing  Head: [place]/AT NG in place  Eyes: Anicteric  Neck: Tracheostomy in place  Lungs:  Scattered rhonchi, vent assisted  Heart: S1S2 no rubs  Abdomen:  Soft, nontender, bowel sounds present  Extremities: trace peripheral edema.  Neurologic: Awake, follows commands  Skin: No lesions  Access: L IJ permcath    Basic Metabolic Panel: Recent Labs  Lab 05/18/18 0743 05/17/2018 0439  NA 129* 132*  K 4.5 3.8  CL 89* 94*  CO2 24 24  GLUCOSE 119* 89  BUN 88* 45*  CREATININE 3.49* 3.07*  CALCIUM 8.8* 8.5*  PHOS 5.4* 4.6    Liver Function Tests: Recent Labs  Lab 05/18/18 0743 05/27/2018 0439  ALBUMIN 2.7* 2.3*   No results for input(s): LIPASE, AMYLASE in the last 168 hours. No results for input(s): AMMONIA in the last 168 hours.  CBC: Recent Labs  Lab 05/19/18 0822 05/19/18 2250 05/03/2018 0439 05/21/18 0537 05/22/18 0613  WBC 18.8* 17.1* 14.8* 13.5* 15.0*  HGB 9.2* 8.2* 8.2* 7.6* 6.5*  HCT 29.8* 25.7* 26.7* 25.7* 21.9*  MCV 88.4 88.3 89.6 92.8 92.8  PLT 549* 512* 523* 540* 475*    Cardiac Enzymes: No results for input(s): CKTOTAL, CKMB, CKMBINDEX, TROPONINI in the last 168 hours.  BNP: Invalid input(s): POCBNP  CBG: No results for input(s): GLUCAP in the last 168 hours.  Microbiology: Results for orders placed or performed during the hospital encounter of 04/30/18  Culture, respiratory (non-expectorated)     Status: None   Collection Time: 05/01/18  7:52 AM  Result Value Ref Range Status   Specimen Description TRACHEAL ASPIRATE  Final   Special Requests NONE  Final   Gram Stain   Final    ABUNDANT WBC PRESENT,BOTH PMN AND MONONUCLEAR NO  ORGANISMS SEEN    Culture   Final    RARE Consistent with normal respiratory flora. Performed at [facility], [address] [place], [place] [address]    Report Status 05/03/2018 FINAL  Final  Culture, respiratory (non-expectorated)     Status: None   Collection Time: 05/13/18  1:50 PM  Result Value Ref Range Status   Specimen Description TRACHEAL ASPIRATE  Final   Special Requests NONE  Final   Gram Stain   Final    ABUNDANT WBC PRESENT, PREDOMINANTLY PMN RARE GRAM POSITIVE COCCI    Culture   Final    RARE Consistent with normal respiratory flora. Performed at [facility], [address] [place], [place] [address]    Report Status 05/15/2018 FINAL  Final  Culture, blood (routine x 2)     Status: None   Collection Time: 05/17/18  4:16 PM  Result Value Ref Range Status   Specimen Description BLOOD RIGHT HAND  Final   Special Requests   Final    BOTTLES DRAWN AEROBIC ONLY Blood Culture adequate volume   Culture   Final    NO GROWTH 5 DAYS Performed at [facility], [address] [place], [place] [address]    Report Status 05/22/2018 FINAL  Final  Culture, blood (routine x 2)     Status: None   Collection Time: 05/17/18  4:22 PM  Result Value Ref Range Status  Specimen Description BLOOD RIGHT HAND  Final   Special Requests   Final    BOTTLES DRAWN AEROBIC ONLY Blood Culture adequate volume   Culture   Final    NO GROWTH 5 DAYS Performed at [facility], [address], [place], [place] [address]    Report Status 05/22/2018 FINAL  Final  Culture, respiratory (non-expectorated)     Status: None   Collection Time: 05/19/18 11:38 AM  Result Value Ref Range Status   Specimen Description TRACHEAL ASPIRATE  Final   Special Requests NONE  Final   Gram Stain   Final    NO WBC SEEN RARE GRAM POSITIVE COCCI IN PAIRS IN CLUSTERS RARE BUDDING YEAST SEEN    Culture   Final    FEW Consistent with normal respiratory flora. Performed at [facility], [address], [place], [place] [address]    Report Status 05/21/2018 FINAL  Final  Culture, blood (routine x 2)     Status: None (Preliminary result)   Collection Time: 05/19/18 11:52 AM  Result Value Ref Range Status   Specimen Description BLOOD LEFT HAND  Final   Special Requests   Final    BOTTLES DRAWN AEROBIC ONLY Blood Culture adequate volume   Culture   Final    NO GROWTH 3 DAYS Performed at [facility], [address] [place], [place] [address]    Report Status PENDING  Incomplete  Culture, blood (routine x 2)     Status: None (Preliminary result)   Collection Time: 05/19/18 11:56 AM  Result Value Ref Range Status   Specimen Description BLOOD LEFT WRIST  Final   Special Requests   Final    BOTTLES DRAWN AEROBIC ONLY Blood Culture adequate volume   Culture   Final    NO GROWTH 3 DAYS Performed at [facility], [address], [place], [place] [address]    Report Status PENDING  Incomplete    Coagulation Studies: No results for input(s): LABPROT, INR in the last 72 hours.  Urinalysis: No results for input(s): COLORURINE, LABSPEC, PHURINE, GLUCOSEU, HGBUR, BILIRUBINUR, KETONESUR, PROTEINUR, UROBILINOGEN, NITRITE, LEUKOCYTESUR in the last 72 hours.  Invalid input(s): APPERANCEUR    Imaging: Dg Forearm Right  Result Date: 05/21/2018 CLINICAL DATA:  Right forearm fracture. EXAM: RIGHT FOREARM - 2 VIEW COMPARISON:  03/23/2018. FINDINGS: Displaced, comminuted, angulated fracture of the distal ulna is again noted. Some degree of callus formation is noted. Radius is intact. IMPRESSION: Displaced, comminuted, angulated fracture of the distal ulna is again noted. Some degree of callus formation on today's exam. Electronically Signed   By: Maisie Fus  Register   On: 05/21/2018 13:48     Medications:       Assessment/ Plan:  37 y.o. female with a PMHx of recent acute respiratory failure requiring intubation, aspiration pneumonia, cardiac arrest,  chronic systolic heart failure, delay of cognitive development, end-stage renal disease on hemodialysis MWF, hypertension, seizure disorder, who was admitted to [facility] on 04/30/2018 for ongoing management of acute respiratory failure, recent PEA arrest, and end-stage renal disease.   1.  ESRD on HD.    Patient seen and evaluated during hemodialysis today.  Tolerating well.  Patient received blood during dialysis treatment.  Next dialysis will be scheduled for Monday.  2.  Acute respiratory failure.  Secondary to aspiration.   -Patient continues to do well off the ventilator.  Pulmonary/critical care following.  3.  Anemia of chronic kidney disease.  Hemoglobin dropped to 6.5 this a.m.  Patient receiving blood transfusion today.  4.  Secondary hyperparathyroidism.  Repeat serum phosphorus on Monday.  5.  Hypertension:   Blood pressure today much better at 120/78.  Continue to monitor.   LOS: 0 Caprina Wussow 8/23/201912:48 PM

## 2018-05-23 LAB — TYPE AND SCREEN
ABO/RH(D): A POS
ANTIBODY SCREEN: NEGATIVE
Unit division: 0
Unit division: 0

## 2018-05-23 LAB — BPAM RBC
Blood Product Expiration Date: 201908282359
Blood Product Expiration Date: 201908292359
ISSUE DATE / TIME: 201908231222
ISSUE DATE / TIME: 201908231407
Unit Type and Rh: 600
Unit Type and Rh: 600

## 2018-05-23 LAB — CBC
HCT: 28.5 % — ABNORMAL LOW (ref 36.0–46.0)
HEMOGLOBIN: 9 g/dL — AB (ref 12.0–15.0)
MCH: 28.6 pg (ref 26.0–34.0)
MCHC: 31.6 g/dL (ref 30.0–36.0)
MCV: 90.5 fL (ref 78.0–100.0)
PLATELETS: 410 10*3/uL — AB (ref 150–400)
RBC: 3.15 MIL/uL — AB (ref 3.87–5.11)
RDW: 17.6 % — ABNORMAL HIGH (ref 11.5–15.5)
WBC: 12.4 10*3/uL — AB (ref 4.0–10.5)

## 2018-05-24 LAB — CULTURE, BLOOD (ROUTINE X 2)
CULTURE: NO GROWTH
CULTURE: NO GROWTH
Special Requests: ADEQUATE
Special Requests: ADEQUATE

## 2018-05-25 ENCOUNTER — Other Ambulatory Visit (HOSPITAL_COMMUNITY): Payer: Self-pay

## 2018-05-25 DIAGNOSIS — J690 Pneumonitis due to inhalation of food and vomit: Secondary | ICD-10-CM | POA: Diagnosis not present

## 2018-05-25 DIAGNOSIS — J9621 Acute and chronic respiratory failure with hypoxia: Secondary | ICD-10-CM | POA: Diagnosis not present

## 2018-05-25 DIAGNOSIS — I5022 Chronic systolic (congestive) heart failure: Secondary | ICD-10-CM | POA: Diagnosis not present

## 2018-05-25 DIAGNOSIS — I469 Cardiac arrest, cause unspecified: Secondary | ICD-10-CM | POA: Diagnosis not present

## 2018-05-25 LAB — CBC
HEMATOCRIT: 33.4 % — AB (ref 36.0–46.0)
HEMATOCRIT: 33.3 % — AB (ref 36.0–46.0)
Hemoglobin: 10.4 g/dL — ABNORMAL LOW (ref 12.0–15.0)
Hemoglobin: 10.4 g/dL — ABNORMAL LOW (ref 12.0–15.0)
MCH: 28.5 pg (ref 26.0–34.0)
MCH: 28.5 pg (ref 26.0–34.0)
MCHC: 31.1 g/dL (ref 30.0–36.0)
MCHC: 31.2 g/dL (ref 30.0–36.0)
MCV: 91.5 fL (ref 78.0–100.0)
MCV: 91.2 fL (ref 78.0–100.0)
PLATELETS: 511 10*3/uL — AB (ref 150–400)
Platelets: 550 10*3/uL — ABNORMAL HIGH (ref 150–400)
RBC: 3.65 MIL/uL — ABNORMAL LOW (ref 3.87–5.11)
RBC: 3.65 MIL/uL — AB (ref 3.87–5.11)
RDW: 18.2 % — AB (ref 11.5–15.5)
RDW: 18.1 % — AB (ref 11.5–15.5)
WBC: 22.4 10*3/uL — ABNORMAL HIGH (ref 4.0–10.5)
WBC: 23.7 10*3/uL — AB (ref 4.0–10.5)

## 2018-05-25 LAB — RENAL FUNCTION PANEL
Albumin: 2.5 g/dL — ABNORMAL LOW (ref 3.5–5.0)
Anion gap: 15 (ref 5–15)
BUN: 62 mg/dL — AB (ref 6–20)
CHLORIDE: 93 mmol/L — AB (ref 98–111)
CO2: 27 mmol/L (ref 22–32)
Calcium: 9.2 mg/dL (ref 8.9–10.3)
Creatinine, Ser: 3.77 mg/dL — ABNORMAL HIGH (ref 0.44–1.00)
GFR calc Af Amer: 17 mL/min — ABNORMAL LOW (ref >60)
GFR calc non Af Amer: 14 mL/min — ABNORMAL LOW (ref >60)
Glucose, Bld: 100 mg/dL — ABNORMAL HIGH (ref 70–99)
POTASSIUM: 3.7 mmol/L (ref 3.5–5.1)
Phosphorus: 3.5 mg/dL (ref 2.5–4.6)
Sodium: 135 mmol/L (ref 135–145)

## 2018-05-25 LAB — C DIFFICILE QUICK SCREEN W PCR REFLEX
C DIFFICILE (CDIFF) TOXIN: NEGATIVE
C DIFFICLE (CDIFF) ANTIGEN: POSITIVE — AB

## 2018-05-25 LAB — CLOSTRIDIUM DIFFICILE BY PCR, REFLEXED: CDIFFPCR: POSITIVE — AB

## 2018-05-25 NOTE — Progress Notes (Signed)
Pulmonary Critical Care [facility]   PULMONARY CRITICAL CARE SERVICE  PROGRESS NOTE  Date of Service: 05/25/2018  Lori Gutierrez  WJX:[id number]  DOB: 02/08/1981   DOA: November 21, 2017  Referring Physician: Carron CurieAli Hijazi, MD  HPI: Lori Gutierrez is a 37 y.o. female seen for follow up of Acute on Chronic Respiratory Failure.  Patient right now is on T collar has been on FiO2 of 35% for more than 48 hours  Medications: Reviewed on Rounds  Physical Exam:  Vitals: Temperature 97.1 pulse 65 respiratory 15 blood pressure 146/98 saturation 96%  Ventilator Settings off the ventilator on T collar  . General: Comfortable at this time . Eyes: Grossly normal lids, irises & conjunctiva . ENT: grossly tongue is normal . Neck: no obvious mass . Cardiovascular: S1 S2 normal no gallop . Respiratory: Coarse breath sounds no rhonchi are noted . Abdomen: soft . Skin: no rash seen on limited exam . Musculoskeletal: not rigid . Psychiatric:unable to assess . Neurologic: no seizure no involuntary movements         Lab Data:   Basic Metabolic Panel: Recent Labs  Lab 05/08/2018 0439 05/22/18 1918  NA 132* 135  K 3.8 3.4*  CL 94* 98  CO2 24 27  GLUCOSE 89 107*  BUN 45* 14  CREATININE 3.07* 1.18*  CALCIUM 8.5* 8.1*  PHOS 4.6 2.0*    Liver Function Tests: Recent Labs  Lab 05/08/2018 0439 05/22/18 1918  ALBUMIN 2.3* 2.3*   No results for input(s): LIPASE, AMYLASE in the last 168 hours. No results for input(s): AMMONIA in the last 168 hours.  CBC: Recent Labs  Lab 05/19/18 2250 05/08/2018 0439 05/21/18 0537 05/22/18 0613 05/23/18 0507  WBC 17.1* 14.8* 13.5* 15.0* 12.4*  HGB 8.2* 8.2* 7.6* 6.5* 9.0*  HCT 25.7* 26.7* 25.7* 21.9* 28.5*  MCV 88.3 89.6 92.8 92.8 90.5  PLT 512* 523* 540* 475* 410*    Cardiac Enzymes: No results for input(s): CKTOTAL, CKMB, CKMBINDEX, TROPONINI in the last 168 hours.  BNP (last 3 results) No results for input(s): BNP in the  last 8760 hours.  ProBNP (last 3 results) No results for input(s): PROBNP in the last 8760 hours.  Radiological Exams: No results found.  Assessment/Plan Principal Problem:   Acute on chronic respiratory failure with hypoxia (HCC) Active Problems:   Aspiration pneumonia (HCC)   Seizure disorder (HCC)   Cardiac arrest (HCC)   End stage renal disease on dialysis (HCC)   Chronic systolic heart failure (HCC)   1. Acute on chronic respiratory failure with hypoxia we will continue with weaning on T, continue aggressive pulmonary toilet supportive care patient prognosis remains guarded. 2. 90 due to aspiration clinically improved we will continue to follow 3. Seizure disorder stable we will monitor 4. Cardiac arrest at baseline 5. End-stage renal disease on dialysis nephrology following for dialysis 6. Chronic systolic heart failure prognosis guarded   I have personally seen and evaluated the patient, evaluated laboratory and imaging results, formulated the assessment and plan and placed orders. The Patient requires high complexity decision making for assessment and support.  Case was discussed on Rounds with the Respiratory Therapy Staff  Yevonne PaxSaadat A Baylyn Sickles, MD [facility] Pulmonary Critical Care Medicine Sleep Medicine

## 2018-05-25 NOTE — Progress Notes (Signed)
[facility]  ROUNDING NOTE   Subjective:  Patient currently on T-piece. Resting comfortably in bed. Still has NG tube in place.   Objective:  Vital signs in last 24 hours:  97.7 pulse 65 respirations 15 blood pressure 146/98  Physical Exam: General: No acute distress  Head: [place]/AT NG in place  Eyes: Anicteric  Neck: Tracheostomy in place  Lungs:  Scattered rhonchi, normal effort  Heart: S1S2 no rubs  Abdomen:  Soft, nontender, bowel sounds present  Extremities: trace peripheral edema.  Neurologic: Awake, follows commands  Skin: No lesions  Access: L IJ permcath    Basic Metabolic Panel: Recent Labs  Lab 03-26-2018 0439 05/22/18 1918  NA 132* 135  K 3.8 3.4*  CL 94* 98  CO2 24 27  GLUCOSE 89 107*  BUN 45* 14  CREATININE 3.07* 1.18*  CALCIUM 8.5* 8.1*  PHOS 4.6 2.0*    Liver Function Tests: Recent Labs  Lab 03-26-2018 0439 05/22/18 1918  ALBUMIN 2.3* 2.3*   No results for input(s): LIPASE, AMYLASE in the last 168 hours. No results for input(s): AMMONIA in the last 168 hours.  CBC: Recent Labs  Lab 05/19/18 2250 03-26-2018 0439 05/21/18 0537 05/22/18 0613 05/23/18 0507  WBC 17.1* 14.8* 13.5* 15.0* 12.4*  HGB 8.2* 8.2* 7.6* 6.5* 9.0*  HCT 25.7* 26.7* 25.7* 21.9* 28.5*  MCV 88.3 89.6 92.8 92.8 90.5  PLT 512* 523* 540* 475* 410*    Cardiac Enzymes: No results for input(s): CKTOTAL, CKMB, CKMBINDEX, TROPONINI in the last 168 hours.  BNP: Invalid input(s): POCBNP  CBG: No results for input(s): GLUCAP in the last 168 hours.  Microbiology: Results for orders placed or performed during the hospital encounter of 04/30/18  Culture, respiratory (non-expectorated)     Status: None   Collection Time: 05/01/18  7:52 AM  Result Value Ref Range Status   Specimen Description TRACHEAL ASPIRATE  Final   Special Requests NONE  Final   Gram Stain   Final    ABUNDANT WBC PRESENT,BOTH PMN AND MONONUCLEAR NO ORGANISMS SEEN    Culture   Final   RARE Consistent with normal respiratory flora. Performed at [facility], [address] [place], [place] [address]    Report Status 05/03/2018 FINAL  Final  Culture, respiratory (non-expectorated)     Status: None   Collection Time: 05/13/18  1:50 PM  Result Value Ref Range Status   Specimen Description TRACHEAL ASPIRATE  Final   Special Requests NONE  Final   Gram Stain   Final    ABUNDANT WBC PRESENT, PREDOMINANTLY PMN RARE GRAM POSITIVE COCCI    Culture   Final    RARE Consistent with normal respiratory flora. Performed at [facility], [address] [place], [place] [address]    Report Status 05/15/2018 FINAL  Final  Culture, blood (routine x 2)     Status: None   Collection Time: 05/17/18  4:16 PM  Result Value Ref Range Status   Specimen Description BLOOD RIGHT HAND  Final   Special Requests   Final    BOTTLES DRAWN AEROBIC ONLY Blood Culture adequate volume   Culture   Final    NO GROWTH 5 DAYS Performed at [facility], [address] [place], [place] [address]    Report Status 05/22/2018 FINAL  Final  Culture, blood (routine x 2)     Status: None   Collection Time: 05/17/18  4:22 PM  Result Value Ref Range Status  Specimen Description BLOOD RIGHT HAND  Final   Special Requests   Final    BOTTLES DRAWN AEROBIC ONLY Blood Culture adequate volume   Culture   Final    NO GROWTH 5 DAYS Performed at [facility], [address], [place], [place] [address]    Report Status 05/22/2018 FINAL  Final  Culture, respiratory (non-expectorated)     Status: None   Collection Time: 05/19/18 11:38 AM  Result Value Ref Range Status   Specimen Description TRACHEAL ASPIRATE  Final   Special Requests NONE  Final   Gram Stain   Final    NO WBC SEEN RARE GRAM POSITIVE COCCI IN PAIRS IN CLUSTERS RARE BUDDING YEAST SEEN    Culture   Final    FEW Consistent with normal respiratory flora. Performed at [facility], [address], [place],  [place] [address]    Report Status 05/21/2018 FINAL  Final  Culture, blood (routine x 2)     Status: None   Collection Time: 05/19/18 11:52 AM  Result Value Ref Range Status   Specimen Description BLOOD LEFT HAND  Final   Special Requests   Final    BOTTLES DRAWN AEROBIC ONLY Blood Culture adequate volume   Culture   Final    NO GROWTH 5 DAYS Performed at [facility], [address], [place], [place] [address]    Report Status 05/24/2018 FINAL  Final  Culture, blood (routine x 2)     Status: None   Collection Time: 05/19/18 11:56 AM  Result Value Ref Range Status   Specimen Description BLOOD LEFT WRIST  Final   Special Requests   Final    BOTTLES DRAWN AEROBIC ONLY Blood Culture adequate volume   Culture   Final    NO GROWTH 5 DAYS Performed at [facility], [address], [place], [place] [address]    Report Status 05/24/2018 FINAL  Final    Coagulation Studies: No results for input(s): LABPROT, INR in the last 72 hours.  Urinalysis: No results for input(s): COLORURINE, LABSPEC, PHURINE, GLUCOSEU, HGBUR, BILIRUBINUR, KETONESUR, PROTEINUR, UROBILINOGEN, NITRITE, LEUKOCYTESUR in the last 72 hours.  Invalid input(s): APPERANCEUR    Imaging: No results found.   Medications:       Assessment/ Plan:  37 y.o. female with a PMHx of recent acute respiratory failure requiring intubation, aspiration pneumonia, cardiac arrest, chronic systolic heart failure, delay of cognitive development, end-stage renal disease on hemodialysis MWF, hypertension, seizure disorder, who was admitted to [facility] on 04/30/2018 for ongoing management of acute respiratory failure, recent PEA arrest, and end-stage renal disease.   1.  ESRD on HD.    Patient due for hemodialysis today.  Orders have been prepared.  Continue dialysis on Monday, Wednesday, Friday schedule.  2.  Acute respiratory failure.  Secondary to aspiration.   -Patient currently on T-piece and appears to be tolerating  well.  It appears that she switches between this and trach collar.  3.  Anemia of chronic kidney disease.  Globin up to 9 posttransfusion.  Continue to monitor.  4.  Secondary hyperparathyroidism.  Phosphorus was 2.0 at last check.  We plan to repeat this today.  5.  Hypertension:   Blood pressure now under much better control multi antihypertensives.  Blood pressure currently 146/98.   LOS: 0 Marco Raper 8/26/20198:30 AM

## 2018-05-26 DIAGNOSIS — I5022 Chronic systolic (congestive) heart failure: Secondary | ICD-10-CM | POA: Diagnosis not present

## 2018-05-26 DIAGNOSIS — J690 Pneumonitis due to inhalation of food and vomit: Secondary | ICD-10-CM | POA: Diagnosis not present

## 2018-05-26 DIAGNOSIS — I469 Cardiac arrest, cause unspecified: Secondary | ICD-10-CM | POA: Diagnosis not present

## 2018-05-26 DIAGNOSIS — J9621 Acute and chronic respiratory failure with hypoxia: Secondary | ICD-10-CM | POA: Diagnosis not present

## 2018-05-26 NOTE — Progress Notes (Signed)
Pulmonary Critical Care Medicine [facility]   PULMONARY CRITICAL CARE SERVICE  PROGRESS NOTE  Date of Service: 05/26/2018  Lori NobleMegan C Gabrielsen  [id number]  DOB: 1981-04-17   DOA: 06-18-2018  Referring Physician: Carron CurieAli Hijazi, MD  HPI: Lori Gutierrez is a 37 y.o. female seen for follow up of Acute on Chronic Respiratory Failure.  Patient right now is on T collar is doing fairly well has a #6 trach in place should be able to downsize  Medications: Reviewed on Rounds  Physical Exam:  Vitals: Temperature 97.4 pulse 70 respiratory rate 16 blood pressure 166/97 saturations 98%  Ventilator Settings currently is off the ventilator on T collar FiO2 30%  . General: Comfortable at this time . Eyes: Grossly normal lids, irises & conjunctiva . ENT: grossly tongue is normal . Neck: no obvious mass . Cardiovascular: S1 S2 normal no gallop . Respiratory: No rhonchi no rales are noted at this time. . Abdomen: soft . Skin: no rash seen on limited exam . Musculoskeletal: not rigid . Psychiatric:unable to assess . Neurologic: no seizure no involuntary movements         Lab Data:   Basic Metabolic Panel: Recent Labs  Lab 2018/02/17 0439 05/22/18 1918 05/25/18 1200  NA 132* 135 135  K 3.8 3.4* 3.7  CL 94* 98 93*  CO2 24 27 27   GLUCOSE 89 107* 100*  BUN 45* 14 62*  CREATININE 3.07* 1.18* 3.77*  CALCIUM 8.5* 8.1* 9.2  PHOS 4.6 2.0* 3.5    Liver Function Tests: Recent Labs  Lab 2018/02/17 0439 05/22/18 1918 05/25/18 1200  ALBUMIN 2.3* 2.3* 2.5*   No results for input(s): LIPASE, AMYLASE in the last 168 hours. No results for input(s): AMMONIA in the last 168 hours.  CBC: Recent Labs  Lab 05/21/18 0537 05/22/18 0613 05/23/18 0507 05/25/18 1200 05/25/18 1440  WBC 13.5* 15.0* 12.4* 22.4* 23.7*  HGB 7.6* 6.5* 9.0* 10.4* 10.4*  HCT 25.7* 21.9* 28.5* 33.4* 33.3*  MCV 92.8 92.8 90.5 91.5 91.2  PLT 540* 475* 410* 550* 511*    Cardiac Enzymes: No results for  input(s): CKTOTAL, CKMB, CKMBINDEX, TROPONINI in the last 168 hours.  BNP (last 3 results) No results for input(s): BNP in the last 8760 hours.  ProBNP (last 3 results) No results for input(s): PROBNP in the last 8760 hours.  Radiological Exams: Dg Chest Port 1 View  Result Date: 05/25/2018 CLINICAL DATA:  Increased urine white cells.  Dialysis. EXAM: PORTABLE CHEST 1 VIEW COMPARISON:  05/19/2018 FINDINGS: Tracheostomy in good position. NG tube in the stomach. Left jugular dialysis access catheter in the right atrium unchanged. Cardiac enlargement. Diffuse bilateral airspace disease consistent with edema. Mild improved aeration since the prior study. Improvement in left lower lobe atelectasis. IMPRESSION: Mild improvement in pulmonary edema. Improvement in left lower lobe atelectasis. Electronically Signed   By: Marlan Palauharles  Clark M.D.   On: 05/25/2018 16:23    Assessment/Plan Principal Problem:   Acute on chronic respiratory failure with hypoxia (HCC) Active Problems:   Aspiration pneumonia (HCC)   Seizure disorder (HCC)   Cardiac arrest (HCC)   End stage renal disease on dialysis (HCC)   Chronic systolic heart failure (HCC)   1. Acute on chronic respiratory failure with hypoxia patient is going to continue to wean on T collar will go ahead and downsize her to a #4 cuffless trach at this point. 2. Pneumonia due to aspiration treated doing better 3. Seizure disorder no active seizures noted. 4. Cardiac  arrest stable we will continue to follow 5. End-stage renal disease on dialysis followed by nephrology 6. Chronic systolic heart failure we will continue with supportive care   I have personally seen and evaluated the patient, evaluated laboratory and imaging results, formulated the assessment and plan and placed orders. The Patient requires high complexity decision making for assessment and support.  Case was discussed on Rounds with the Respiratory Therapy Staff  Yevonne Pax, MD  [facility] Pulmonary Critical Care Medicine Sleep Medicine

## 2018-05-27 DIAGNOSIS — J9621 Acute and chronic respiratory failure with hypoxia: Secondary | ICD-10-CM | POA: Diagnosis not present

## 2018-05-27 DIAGNOSIS — I469 Cardiac arrest, cause unspecified: Secondary | ICD-10-CM | POA: Diagnosis not present

## 2018-05-27 DIAGNOSIS — J690 Pneumonitis due to inhalation of food and vomit: Secondary | ICD-10-CM | POA: Diagnosis not present

## 2018-05-27 DIAGNOSIS — I5022 Chronic systolic (congestive) heart failure: Secondary | ICD-10-CM | POA: Diagnosis not present

## 2018-05-27 LAB — CBC
HEMATOCRIT: 33 % — AB (ref 36.0–46.0)
Hemoglobin: 9.9 g/dL — ABNORMAL LOW (ref 12.0–15.0)
MCH: 28.2 pg (ref 26.0–34.0)
MCHC: 30 g/dL (ref 30.0–36.0)
MCV: 94 fL (ref 78.0–100.0)
Platelets: 562 10*3/uL — ABNORMAL HIGH (ref 150–400)
RBC: 3.51 MIL/uL — ABNORMAL LOW (ref 3.87–5.11)
RDW: 18.4 % — AB (ref 11.5–15.5)
WBC: 21.2 10*3/uL — AB (ref 4.0–10.5)

## 2018-05-27 LAB — MAGNESIUM: Magnesium: 2.3 mg/dL (ref 1.7–2.4)

## 2018-05-27 LAB — RENAL FUNCTION PANEL
ANION GAP: 13 (ref 5–15)
Albumin: 2.6 g/dL — ABNORMAL LOW (ref 3.5–5.0)
BUN: 51 mg/dL — AB (ref 6–20)
CHLORIDE: 95 mmol/L — AB (ref 98–111)
CO2: 25 mmol/L (ref 22–32)
Calcium: 9.3 mg/dL (ref 8.9–10.3)
Creatinine, Ser: 3.09 mg/dL — ABNORMAL HIGH (ref 0.44–1.00)
GFR calc Af Amer: 21 mL/min — ABNORMAL LOW (ref >60)
GFR, EST NON AFRICAN AMERICAN: 18 mL/min — AB (ref >60)
Glucose, Bld: 118 mg/dL — ABNORMAL HIGH (ref 70–99)
POTASSIUM: 3.6 mmol/L (ref 3.5–5.1)
Phosphorus: 3.1 mg/dL (ref 2.5–4.6)
Sodium: 133 mmol/L — ABNORMAL LOW (ref 135–145)

## 2018-05-27 LAB — VANCOMYCIN, RANDOM: VANCOMYCIN RM: 12

## 2018-05-27 NOTE — Progress Notes (Signed)
[facility]  ROUNDING NOTE   Subjective:  Patient seen and evaluated during hemodialysis. Appears to be tolerating well. Awake and alert and following commands.   Objective:  Vital signs in last 24 hours:  Temperature 97.4 pulse 73 respirations 11 blood pressure 147/98  Physical Exam: General: No acute distress  Head: [place]/AT NG in place  Eyes: Anicteric  Neck: Tracheostomy in place  Lungs:  Scattered rhonchi, normal effort  Heart: S1S2 no rubs  Abdomen:  Soft, nontender, bowel sounds present  Extremities: trace peripheral edema.  Neurologic: Awake, follows commands  Skin: No lesions  Access: L IJ permcath    Basic Metabolic Panel: Recent Labs  Lab 05/22/18 1918 05/25/18 1200 05/27/18 0538  NA 135 135 133*  K 3.4* 3.7 3.6  CL 98 93* 95*  CO2 27 27 25   GLUCOSE 107* 100* 118*  BUN 14 62* 51*  CREATININE 1.18* 3.77* 3.09*  CALCIUM 8.1* 9.2 9.3  MG  --   --  2.3  PHOS 2.0* 3.5 3.1    Liver Function Tests: Recent Labs  Lab 05/22/18 1918 05/25/18 1200 05/27/18 0538  ALBUMIN 2.3* 2.5* 2.6*   No results for input(s): LIPASE, AMYLASE in the last 168 hours. No results for input(s): AMMONIA in the last 168 hours.  CBC: Recent Labs  Lab 05/22/18 0613 05/23/18 0507 05/25/18 1200 05/25/18 1440 05/27/18 0538  WBC 15.0* 12.4* 22.4* 23.7* 21.2*  HGB 6.5* 9.0* 10.4* 10.4* 9.9*  HCT 21.9* 28.5* 33.4* 33.3* 33.0*  MCV 92.8 90.5 91.5 91.2 94.0  PLT 475* 410* 550* 511* 562*    Cardiac Enzymes: No results for input(s): CKTOTAL, CKMB, CKMBINDEX, TROPONINI in the last 168 hours.  BNP: Invalid input(s): POCBNP  CBG: No results for input(s): GLUCAP in the last 168 hours.  Microbiology: Results for orders placed or performed during the hospital encounter of 05/10/2018  Culture, respiratory (non-expectorated)     Status: None (Preliminary result)   Collection Time: 05/25/18  4:10 PM  Result Value Ref Range Status   Specimen Description TRACHEAL  ASPIRATE  Final   Special Requests NONE  Final   Gram Stain   Final    RARE WBC PRESENT, PREDOMINANTLY PMN FEW GRAM POSITIVE COCCI    Culture   Final    MODERATE ESCHERICHIA COLI SUSCEPTIBILITIES TO FOLLOW Performed at [facility], [address] [place], [place] [address]    Report Status PENDING  Incomplete  C difficile quick scan w PCR reflex     Status: Abnormal   Collection Time: 05/25/18  4:27 PM  Result Value Ref Range Status   C Diff antigen POSITIVE (A) NEGATIVE Final   C Diff toxin NEGATIVE NEGATIVE Final   C Diff interpretation Results are indeterminate. See PCR results.  Final    Comment: Performed at [facility], [address] [place], [place] [address]  C. Diff by PCR, Reflexed     Status: Abnormal   Collection Time: 05/25/18  4:27 PM  Result Value Ref Range Status   Toxigenic C. Difficile by PCR POSITIVE (A) NEGATIVE Final    Comment: Positive for toxigenic C. difficile with little to no toxin production. Only treat if clinical presentation suggests symptomatic illness. Performed at [facility], [address] [place], [place] [address]   Culture, blood (routine x 2)     Status: None (Preliminary result)   Collection Time: 05/25/18  8:20 PM  Result Value Ref Range Status   Specimen Description  BLOOD LEFT WRIST  Final   Special Requests   Final    BOTTLES DRAWN AEROBIC ONLY Blood Culture results may not be optimal due to an inadequate volume of blood received in culture bottles   Culture   Final    NO GROWTH 2 DAYS Performed at [facility], [address] [place], [place] [address]    Report Status PENDING  Incomplete  Culture, blood (routine x 2)     Status: None (Preliminary result)   Collection Time: 05/25/18  8:28 PM  Result Value Ref Range Status   Specimen Description BLOOD LEFT HAND  Final   Special Requests   Final    BOTTLES DRAWN AEROBIC ONLY Blood Culture results may not be optimal due to an inadequate volume of blood  received in culture bottles   Culture   Final    NO GROWTH 2 DAYS Performed at [facility], [address] [place], [place] [address]    Report Status PENDING  Incomplete    Coagulation Studies: No results for input(s): LABPROT, INR in the last 72 hours.  Urinalysis: No results for input(s): COLORURINE, LABSPEC, PHURINE, GLUCOSEU, HGBUR, BILIRUBINUR, KETONESUR, PROTEINUR, UROBILINOGEN, NITRITE, LEUKOCYTESUR in the last 72 hours.  Invalid input(s): APPERANCEUR    Imaging: Dg Chest Port 1 View  Result Date: 05/25/2018 CLINICAL DATA:  Increased urine white cells.  Dialysis. EXAM: PORTABLE CHEST 1 VIEW COMPARISON:  05/19/2018 FINDINGS: Tracheostomy in good position. NG tube in the stomach. Left jugular dialysis access catheter in the right atrium unchanged. Cardiac enlargement. Diffuse bilateral airspace disease consistent with edema. Mild improved aeration since the prior study. Improvement in left lower lobe atelectasis. IMPRESSION: Mild improvement in pulmonary edema. Improvement in left lower lobe atelectasis. Electronically Signed   By: Marlan Palau M.D.   On: 05/25/2018 16:23     Medications:       Assessment/ Plan:  37 y.o. female with a PMHx of recent acute respiratory failure requiring intubation, aspiration pneumonia, cardiac arrest, chronic systolic heart failure, delay of cognitive development, end-stage renal disease on hemodialysis MWF, hypertension, seizure disorder, who was admitted to [facility] on 04/30/2018 for ongoing management of acute respiratory failure, recent PEA arrest, and end-stage renal disease.   1.  ESRD on HD.    Patient seen and evaluated during hemodialysis.  Tolerating well.  We plan to complete dialysis today and next dialysis treatment will be on Friday.  2.  Acute respiratory failure.  Secondary to aspiration.   -The patient continues to tolerate T-piece quite well.  Continue current respiratory support.  3.  Anemia of chronic  kidney disease.  Hemoglobin was 9.9 today and relatively stable.  4.  Secondary hyperparathyroidism.  Phosphorus 3.1 and at target.  Continue to monitor.  5.  Hypertension:   Blood pressure during dialysis acceptable.  She is on multiple antihypertensives.  Continue current doses of amlodipine, clonidine, hydralazine, lisinopril, and metoprolol.   LOS: 0 Viktor Philipp 8/28/201910:58 AM

## 2018-05-27 NOTE — Progress Notes (Signed)
Pulmonary Critical Care Medicine [facility] [place]   PULMONARY CRITICAL CARE SERVICE  PROGRESS NOTE  Date of Service: 05/27/2018  Lori Gutierrez  ZOX:[id number]  DOB: 04/19/1981   DOA: 16-Jun-2018  Referring Physician: Carron Curie, MD  HPI: Lori Gutierrez is a 37 y.o. female seen for follow up of Acute on Chronic Respiratory Failure.  Patient right now is on T collar has been on 20% FiO2 tolerating PMV.  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  She is not able to have any kind of gag reflex when she was evaluated.  In addition she is not able to really handle her secretions.  She will likely need to keep the trach in for the foreseeable future  Medications: Reviewed on Rounds  Physical Exam:  Vitals: Temperature 97.4 pulse 73 respiratory rate 12 blood pressure 182/104 saturation 94%  Ventilator Settings currently off the ventilator on T collar 28% FiO2  . General: Comfortable at this time . Eyes: Grossly normal lids, irises & conjunctiva . ENT: grossly tongue is normal . Neck: no obvious mass . Cardiovascular: S1 S2 normal no gallop . Respiratory: Coarse upper airway sounds are heard secondary to secretions . Abdomen: soft . Skin: no rash seen on limited exam . Musculoskeletal: not rigid . Psychiatric:unable to assess . Neurologic: no seizure no involuntary movements         Lab Data:   Basic Metabolic Panel: Recent Labs  Lab 05/22/18 1918 05/25/18 1200 05/27/18 0538  NA 135 135 133*  K 3.4* 3.7 3.6  CL 98 93* 95*  CO2 27 27 25   GLUCOSE 107* 100* 118*  BUN 14 62* 51*  CREATININE 1.18* 3.77* 3.09*  CALCIUM 8.1* 9.2 9.3  MG  --   --  2.3  PHOS 2.0* 3.5 3.1    Liver Function Tests: Recent Labs  Lab 05/22/18 1918 05/25/18 1200 05/27/18 0538  ALBUMIN 2.3* 2.5* 2.6*   No results for input(s): LIPASE, AMYLASE in the last 168 hours. No results for input(s): AMMONIA in the last 168  hours.  CBC: Recent Labs  Lab 05/22/18 0613 05/23/18 0507 05/25/18 1200 05/25/18 1440 05/27/18 0538  WBC 15.0* 12.4* 22.4* 23.7* 21.2*  HGB 6.5* 9.0* 10.4* 10.4* 9.9*  HCT 21.9* 28.5* 33.4* 33.3* 33.0*  MCV 92.8 90.5 91.5 91.2 94.0  PLT 475* 410* 550* 511* 562*    Cardiac Enzymes: No results for input(s): CKTOTAL, CKMB, CKMBINDEX, TROPONINI in the last 168 hours.  BNP (last 3 results) No results for input(s): BNP in the last 8760 hours.  ProBNP (last 3 results) No results for input(s): PROBNP in the last 8760 hours.  Radiological Exams: Dg Chest Port 1 View  Result Date: 05/25/2018 CLINICAL DATA:  Increased urine white cells.  Dialysis. EXAM: PORTABLE CHEST 1 VIEW COMPARISON:  05/19/2018 FINDINGS: Tracheostomy in good position. NG tube in the stomach. Left jugular dialysis access catheter in the right atrium unchanged. Cardiac enlargement. Diffuse bilateral airspace disease consistent with edema. Mild improved aeration since the prior study. Improvement in left lower lobe atelectasis. IMPRESSION: Mild improvement in pulmonary edema. Improvement in left lower lobe atelectasis. Electronically Signed   By: Marlan Palau M.D.   On: 05/25/2018 16:23    Assessment/Plan Principal Problem:   Acute on chronic respiratory failure with hypoxia [facility]) Active Problems:   Aspiration pneumonia (HCC)   Seizure disorder (HCC)   Cardiac arrest (HCC)   End stage renal disease on dialysis (HCC)  Chronic systolic heart failure (HCC)   1. Acute on chronic respiratory failure with hypoxia patient right now is on T collar which will be continued.  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 Pneumonia due to aspiration treated with antibiotics we will follow-up x-ray as necessary last x-ray had shown some lower lobe atelectasis. 3. Seizure disorder no active seizures noted at this  time. 4. Cardiac arrest patient's at baseline rhythm 5. End-stage renal disease on dialysis followed by nephrology appreciate their input 6. Chronic systolic heart failure currently is compensated we will continue with supportive care   I have personally seen and evaluated the patient, evaluated laboratory and imaging results, formulated the assessment and plan and placed orders. The Patient requires high complexity decision making for assessment and support.  Case was discussed on Rounds with the Respiratory Therapy Staff  Yevonne PaxSaadat A Khan, MD [facility] Pulmonary Critical Care Medicine Sleep Medicine

## 2018-05-28 ENCOUNTER — Other Ambulatory Visit (HOSPITAL_COMMUNITY): Payer: Self-pay

## 2018-05-28 DIAGNOSIS — I5022 Chronic systolic (congestive) heart failure: Secondary | ICD-10-CM | POA: Diagnosis not present

## 2018-05-28 DIAGNOSIS — J690 Pneumonitis due to inhalation of food and vomit: Secondary | ICD-10-CM | POA: Diagnosis not present

## 2018-05-28 DIAGNOSIS — I469 Cardiac arrest, cause unspecified: Secondary | ICD-10-CM | POA: Diagnosis not present

## 2018-05-28 DIAGNOSIS — J9621 Acute and chronic respiratory failure with hypoxia: Secondary | ICD-10-CM | POA: Diagnosis not present

## 2018-05-28 LAB — CULTURE, RESPIRATORY

## 2018-05-28 LAB — CULTURE, RESPIRATORY W GRAM STAIN

## 2018-05-28 NOTE — Progress Notes (Signed)
Pulmonary Critical Care [facility] [place]   PULMONARY CRITICAL CARE SERVICE  PROGRESS NOTE  Date of Service: 05/28/2018  Asencion NobleMegan C Aspinwall  ZOX:[id number]  DOB: June 30, 1981   DOA: 05/19/2018  Referring Physician: Carron CurieAli Hijazi, MD  HPI: Asencion NobleMegan C Longwell is a 37 y.o. female seen for follow up of Acute on Chronic Respiratory Failure.  She is on T collar is on 30% oxygen good saturations are noted doing fairly well at this time  Medications: Reviewed on Rounds  Physical Exam:  Vitals: Temperature 97.2 pulse 81 respiratory rate 16 blood pressure 143/88 saturations 93%  Ventilator Settings off the ventilator on T collar  . General: Comfortable at this time . Eyes: Grossly normal lids, irises & conjunctiva . ENT: grossly tongue is normal . Neck: no obvious mass . Cardiovascular: S1 S2 normal no gallop . Respiratory: Coarse rhonchi are noted . Abdomen: soft . Skin: no rash seen on limited exam . Musculoskeletal: not rigid . Psychiatric:unable to assess . Neurologic: no seizure no involuntary movements         Lab Data:   Basic Metabolic Panel: Recent Labs  Lab 05/22/18 1918 05/25/18 1200 05/27/18 0538  NA 135 135 133*  K 3.4* 3.7 3.6  CL 98 93* 95*  CO2 27 27 25   GLUCOSE 107* 100* 118*  BUN 14 62* 51*  CREATININE 1.18* 3.77* 3.09*  CALCIUM 8.1* 9.2 9.3  MG  --   --  2.3  PHOS 2.0* 3.5 3.1    Liver Function Tests: Recent Labs  Lab 05/22/18 1918 05/25/18 1200 05/27/18 0538  ALBUMIN 2.3* 2.5* 2.6*   No results for input(s): LIPASE, AMYLASE in the last 168 hours. No results for input(s): AMMONIA in the last 168 hours.  CBC: Recent Labs  Lab 05/22/18 0613 05/23/18 0507 05/25/18 1200 05/25/18 1440 05/27/18 0538  WBC 15.0* 12.4* 22.4* 23.7* 21.2*  HGB 6.5* 9.0* 10.4* 10.4* 9.9*  HCT 21.9* 28.5* 33.4* 33.3* 33.0*  MCV 92.8 90.5 91.5 91.2 94.0  PLT 475* 410* 550* 511* 562*    Cardiac Enzymes: No results for input(s): CKTOTAL, CKMB,  CKMBINDEX, TROPONINI in the last 168 hours.  BNP (last 3 results) No results for input(s): BNP in the last 8760 hours.  ProBNP (last 3 results) No results for input(s): PROBNP in the last 8760 hours.  Radiological Exams: Dg Chest Port 1 View  Result Date: 05/28/2018 CLINICAL DATA:  Nasogastric tube placement. EXAM: PORTABLE CHEST 1 VIEW COMPARISON:  Radiograph of May 25, 2018. FINDINGS: Stable cardiomegaly. Tracheostomy tube and nasogastric tube are unchanged in position. Left internal jugular dialysis catheter is noted with tip in expected position of right atrium. Central pulmonary vascular congestion is noted with possible minimal bilateral pulmonary edema. No pneumothorax or significant pleural effusion is noted. Bony thorax is unremarkable. IMPRESSION: Stable support apparatus. Stable cardiomegaly with central pulmonary vascular congestion. Stable minimal bilateral pulmonary edema. Electronically Signed   By: Lupita RaiderJames  Green Jr, M.D.   On: 05/28/2018 10:42   Dg Abd Portable 1v  Result Date: 05/28/2018 CLINICAL DATA:  Nasogastric tube. EXAM: PORTABLE ABDOMEN - 1 VIEW COMPARISON:  Radiograph of April 30, 2018. FINDINGS: The bowel gas pattern is normal. Nasogastric tube tip is seen in expected position of distal stomach. No radio-opaque calculi or other significant radiographic abnormality are seen. IMPRESSION: Nasogastric tube tip seen in expected position of distal stomach. No definite evidence of bowel obstruction or ileus. Electronically Signed   By: Lupita RaiderJames  Green Jr, M.D.   On:  05/28/2018 10:43    Assessment/Plan Principal Problem:   Acute on chronic respiratory failure with hypoxia (HCC) Active Problems:   Aspiration pneumonia (HCC)   Seizure disorder (HCC)   Cardiac arrest (HCC)   End stage renal disease on dialysis (HCC)   Chronic systolic heart failure (HCC)   1. Acute on chronic respiratory failure with hypoxia we will continue weaning on T collar secretions are still  significant continue aggressive pulmonary toilet 2. Pneumonia due to aspiration will continue with supportive care has been treated with antibiotics 3. Seizure disorder no active seizures noted 4. Cardiac arrest stable at this time we will continue to monitor 5. End-stage renal disease follow-up labs 6. Chronic systolic heart failure has pretty significant failure still on the x-ray needs to have more fluid removed   I have personally seen and evaluated the patient, evaluated laboratory and imaging results, formulated the assessment and plan and placed orders. The Patient requires high complexity decision making for assessment and support.  Case was discussed on Rounds with the Respiratory Therapy Staff  Yevonne Pax, MD [facility] Pulmonary Critical Care Medicine Sleep Medicine

## 2018-05-29 DIAGNOSIS — J690 Pneumonitis due to inhalation of food and vomit: Secondary | ICD-10-CM | POA: Diagnosis not present

## 2018-05-29 DIAGNOSIS — I5022 Chronic systolic (congestive) heart failure: Secondary | ICD-10-CM | POA: Diagnosis not present

## 2018-05-29 DIAGNOSIS — J9621 Acute and chronic respiratory failure with hypoxia: Secondary | ICD-10-CM | POA: Diagnosis not present

## 2018-05-29 DIAGNOSIS — I469 Cardiac arrest, cause unspecified: Secondary | ICD-10-CM | POA: Diagnosis not present

## 2018-05-29 LAB — RENAL FUNCTION PANEL
ALBUMIN: 2.7 g/dL — AB (ref 3.5–5.0)
ANION GAP: 11 (ref 5–15)
BUN: 45 mg/dL — AB (ref 6–20)
CO2: 26 mmol/L (ref 22–32)
Calcium: 9.4 mg/dL (ref 8.9–10.3)
Chloride: 98 mmol/L (ref 98–111)
Creatinine, Ser: 3.11 mg/dL — ABNORMAL HIGH (ref 0.44–1.00)
GFR calc Af Amer: 21 mL/min — ABNORMAL LOW (ref >60)
GFR calc non Af Amer: 18 mL/min — ABNORMAL LOW (ref >60)
Glucose, Bld: 122 mg/dL — ABNORMAL HIGH (ref 70–99)
PHOSPHORUS: 3.4 mg/dL (ref 2.5–4.6)
POTASSIUM: 3.8 mmol/L (ref 3.5–5.1)
Sodium: 135 mmol/L (ref 135–145)

## 2018-05-29 LAB — CBC
HEMATOCRIT: 34.2 % — AB (ref 36.0–46.0)
Hemoglobin: 10.3 g/dL — ABNORMAL LOW (ref 12.0–15.0)
MCH: 28.2 pg (ref 26.0–34.0)
MCHC: 30.1 g/dL (ref 30.0–36.0)
MCV: 93.7 fL (ref 78.0–100.0)
Platelets: 554 10*3/uL — ABNORMAL HIGH (ref 150–400)
RBC: 3.65 MIL/uL — ABNORMAL LOW (ref 3.87–5.11)
RDW: 18.5 % — AB (ref 11.5–15.5)
WBC: 17.9 10*3/uL — ABNORMAL HIGH (ref 4.0–10.5)

## 2018-05-29 NOTE — Progress Notes (Signed)
[facility]  ROUNDING NOTE   Subjective:  Patient seen and evaluated during hemodialysis. Tolerating well. Ultrafiltration target 1.5 kg.   Objective:  Vital signs in last 24 hours:  Temperature 98.7 pulse 78 respirations 19 blood pressure 180/94  Physical Exam: General: No acute distress  Head: [place]/AT NG in place  Eyes: Anicteric  Neck: Tracheostomy in place  Lungs:  Scattered rhonchi, normal effort  Heart: S1S2 no rubs  Abdomen:  Soft, nontender, bowel sounds present  Extremities: trace peripheral edema.  Neurologic: Awake, follows commands  Skin: No lesions  Access: L IJ permcath    Basic Metabolic Panel: Recent Labs  Lab 05/22/18 1918 05/25/18 1200 05/27/18 0538 05/29/18 0455  NA 135 135 133* 135  K 3.4* 3.7 3.6 3.8  CL 98 93* 95* 98  CO2 27 27 25 26   GLUCOSE 107* 100* 118* 122*  BUN 14 62* 51* 45*  CREATININE 1.18* 3.77* 3.09* 3.11*  CALCIUM 8.1* 9.2 9.3 9.4  MG  --   --  2.3  --   PHOS 2.0* 3.5 3.1 3.4    Liver Function Tests: Recent Labs  Lab 05/22/18 1918 05/25/18 1200 05/27/18 0538 05/29/18 0455  ALBUMIN 2.3* 2.5* 2.6* 2.7*   No results for input(s): LIPASE, AMYLASE in the last 168 hours. No results for input(s): AMMONIA in the last 168 hours.  CBC: Recent Labs  Lab 05/23/18 0507 05/25/18 1200 05/25/18 1440 05/27/18 0538 05/29/18 0455  WBC 12.4* 22.4* 23.7* 21.2* 17.9*  HGB 9.0* 10.4* 10.4* 9.9* 10.3*  HCT 28.5* 33.4* 33.3* 33.0* 34.2*  MCV 90.5 91.5 91.2 94.0 93.7  PLT 410* 550* 511* 562* 554*    Cardiac Enzymes: No results for input(s): CKTOTAL, CKMB, CKMBINDEX, TROPONINI in the last 168 hours.  BNP: Invalid input(s): POCBNP  CBG: No results for input(s): GLUCAP in the last 168 hours.  Microbiology: Results for orders placed or performed during the hospital encounter of 05/06/2018  Culture, respiratory (non-expectorated)     Status: None   Collection Time: 05/25/18  4:10 PM  Result Value Ref Range Status    Specimen Description TRACHEAL ASPIRATE  Final   Special Requests NONE  Final   Gram Stain   Final    RARE WBC PRESENT, PREDOMINANTLY PMN FEW GRAM POSITIVE COCCI Performed at [facility], [address] [place], [place] [address]    Culture   Final    MODERATE ESCHERICHIA COLI Confirmed Extended Spectrum Beta-Lactamase Producer (ESBL).  In bloodstream infections from ESBL organisms, carbapenems are preferred over piperacillin/tazobactam. They are shown to have a lower risk of mortality.    Report Status 05/28/2018 FINAL  Final   Organism ID, Bacteria ESCHERICHIA COLI  Final      Susceptibility   Escherichia coli - MIC*    AMPICILLIN >=32 RESISTANT Resistant     CEFAZOLIN >=64 RESISTANT Resistant     CEFEPIME RESISTANT Resistant     CEFTAZIDIME >=64 RESISTANT Resistant     CEFTRIAXONE >=64 RESISTANT Resistant     CIPROFLOXACIN >=4 RESISTANT Resistant     GENTAMICIN <=1 SENSITIVE Sensitive     IMIPENEM <=0.25 SENSITIVE Sensitive     TRIMETH/SULFA <=20 SENSITIVE Sensitive     AMPICILLIN/SULBACTAM >=32 RESISTANT Resistant     PIP/TAZO >=128 RESISTANT Resistant     Extended ESBL POSITIVE Resistant     * MODERATE ESCHERICHIA COLI  C difficile quick scan w PCR reflex     Status: Abnormal   Collection Time: 05/25/18  4:27 PM  Result Value Ref Range Status   C Diff antigen POSITIVE (A) NEGATIVE Final   C Diff toxin NEGATIVE NEGATIVE Final   C Diff interpretation Results are indeterminate. See PCR results.  Final    Comment: Performed at [facility], [address] [place], [place] [address]  C. Diff by PCR, Reflexed     Status: Abnormal   Collection Time: 05/25/18  4:27 PM  Result Value Ref Range Status   Toxigenic C. Difficile by PCR POSITIVE (A) NEGATIVE Final    Comment: Positive for toxigenic C. difficile with little to no toxin production. Only treat if clinical presentation suggests symptomatic illness. Performed at [facility], [address]  [place], [place] [address]   Culture, blood (routine x 2)     Status: None (Preliminary result)   Collection Time: 05/25/18  8:20 PM  Result Value Ref Range Status   Specimen Description BLOOD LEFT WRIST  Final   Special Requests   Final    BOTTLES DRAWN AEROBIC ONLY Blood Culture results may not be optimal due to an inadequate volume of blood received in culture bottles   Culture   Final    NO GROWTH 3 DAYS Performed at [facility], [address] [place], [place] [address]    Report Status PENDING  Incomplete  Culture, blood (routine x 2)     Status: None (Preliminary result)   Collection Time: 05/25/18  8:28 PM  Result Value Ref Range Status   Specimen Description BLOOD LEFT HAND  Final   Special Requests   Final    BOTTLES DRAWN AEROBIC ONLY Blood Culture results may not be optimal due to an inadequate volume of blood received in culture bottles   Culture   Final    NO GROWTH 3 DAYS Performed at [facility], [address] [place], [place] [address]    Report Status PENDING  Incomplete    Coagulation Studies: No results for input(s): LABPROT, INR in the last 72 hours.  Urinalysis: No results for input(s): COLORURINE, LABSPEC, PHURINE, GLUCOSEU, HGBUR, BILIRUBINUR, KETONESUR, PROTEINUR, UROBILINOGEN, NITRITE, LEUKOCYTESUR in the last 72 hours.  Invalid input(s): APPERANCEUR    Imaging: Dg Chest Port 1 View  Result Date: 05/28/2018 CLINICAL DATA:  Nasogastric tube placement. EXAM: PORTABLE CHEST 1 VIEW COMPARISON:  Radiograph of May 25, 2018. FINDINGS: Stable cardiomegaly. Tracheostomy tube and nasogastric tube are unchanged in position. Left internal jugular dialysis catheter is noted with tip in expected position of right atrium. Central pulmonary vascular congestion is noted with possible minimal bilateral pulmonary edema. No pneumothorax or significant pleural effusion is noted. Bony thorax is unremarkable. IMPRESSION: Stable support apparatus. Stable  cardiomegaly with central pulmonary vascular congestion. Stable minimal bilateral pulmonary edema. Electronically Signed   By: Lupita RaiderJames  Green Jr, M.D.   On: 05/28/2018 10:42   Dg Abd Portable 1v  Result Date: 05/28/2018 CLINICAL DATA:  Initial evaluation for NG tube placement EXAM: PORTABLE ABDOMEN - 1 VIEW COMPARISON:  Prior radiograph from earlier the same day. FINDINGS: NG tube in place, tip in side hole overlying the mid/lower left abdomen, likely within the low lying stomach. Gaseous distension of the sigmoid colon within the lower abdomen, similar to previous. No significant small bowel dilatation. Small layering bilateral pleural effusions. Associated left basilar opacity likely atelectasis. IMPRESSION: 1. Enteric tube in place with tip and side hole overlying the mid and lower left abdomen, likely within the low lying stomach as seen on prior  cross-sectional imaging. 2. Small bilateral pleural effusions. Associated left basilar opacity likely reflects atelectasis. Electronically Signed   By: Rise Mu M.D.   On: 05/28/2018 22:53   Dg Abd Portable 1v  Result Date: 05/28/2018 CLINICAL DATA:  NG tube placement. EXAM: PORTABLE ABDOMEN - 1 VIEW COMPARISON:  Plain film the abdomen from earlier same day. FINDINGS: There is now no enteric tube appreciated within the upper abdomen. Prominent gas-filled loops of bowel within the lower abdomen appear stable, presumed colon. IMPRESSION: No enteric tube appreciated within the upper abdomen. Electronically Signed   By: Bary Richard M.D.   On: 05/28/2018 19:40   Dg Abd Portable 1v  Result Date: 05/28/2018 CLINICAL DATA:  Nasogastric tube. EXAM: PORTABLE ABDOMEN - 1 VIEW COMPARISON:  Radiograph of April 30, 2018. FINDINGS: The bowel gas pattern is normal. Nasogastric tube tip is seen in expected position of distal stomach. No radio-opaque calculi or other significant radiographic abnormality are seen. IMPRESSION: Nasogastric tube tip seen in expected  position of distal stomach. No definite evidence of bowel obstruction or ileus. Electronically Signed   By: Lupita Raider, M.D.   On: 05/28/2018 10:43     Medications:       Assessment/ Plan:  37 y.o. female with a PMHx of recent acute respiratory failure requiring intubation, aspiration pneumonia, cardiac arrest, chronic systolic heart failure, delay of cognitive development, end-stage renal disease on hemodialysis MWF, hypertension, seizure disorder, who was admitted to [facility] on 04/30/2018 for ongoing management of acute respiratory failure, recent PEA arrest, and end-stage renal disease.   1.  ESRD on HD.    Patient seen and evaluated during hemodialysis and tolerating well at this time.  Ultrafiltration target 1.5 kg.  Thereafter we will plan for dialysis again on Monday.  2.  Acute respiratory failure.  Secondary to aspiration.   -Continues to have some secretions per her tracheostomy.  Antibiotic management as per hospitalist.  3.  Anemia of chronic kidney disease.  Hemoglobin currently 10.3 and at target.  Continue to monitor.  4.  Secondary hyperparathyroidism.  Serum phosphorus remains in target range.  Phosphorus this a.m. was 3.4.  5.  Hypertension:     Continue current doses of amlodipine, clonidine, hydralazine, lisinopril, and metoprolol.   LOS: 0 Jaz Mallick 8/30/20198:34 AM

## 2018-05-29 NOTE — Progress Notes (Signed)
Pulmonary Critical Care Medicine [facility] [place]   PULMONARY CRITICAL CARE SERVICE  PROGRESS NOTE  Date of Service: 05/29/2018  Lori Gutierrez  NFA:[id number]  DOB: March 16, 1981   DOA: 2018-06-11  Referring Physician: Carron Curie, MD  HPI: Lori Gutierrez is a 37 y.o. female seen for follow up of Acute on Chronic Respiratory Failure.  Off the ventilator right now patient's on T collar she is awake tolerating PMV  Medications: Reviewed on Rounds  Physical Exam:  Vitals: Temperature 97.3 pulse 78 respiratory rate 19 blood pressure 180/99 saturation 98%  Ventilator Settings patient right now is on T collar with 40% FiO2  . General: Comfortable at this time . Eyes: Grossly normal lids, irises & conjunctiva . ENT: grossly tongue is normal . Neck: no obvious mass . Cardiovascular: S1 S2 normal no gallop . Respiratory: No rhonchi no rales are noted . Abdomen: soft . Skin: no rash seen on limited exam . Musculoskeletal: not rigid . Psychiatric:unable to assess . Neurologic: no seizure no involuntary movements         Lab Data:   Basic Metabolic Panel: Recent Labs  Lab 05/22/18 1918 05/25/18 1200 05/27/18 0538 05/29/18 0455  NA 135 135 133* 135  K 3.4* 3.7 3.6 3.8  CL 98 93* 95* 98  CO2 27 27 25 26   GLUCOSE 107* 100* 118* 122*  BUN 14 62* 51* 45*  CREATININE 1.18* 3.77* 3.09* 3.11*  CALCIUM 8.1* 9.2 9.3 9.4  MG  --   --  2.3  --   PHOS 2.0* 3.5 3.1 3.4    Liver Function Tests: Recent Labs  Lab 05/22/18 1918 05/25/18 1200 05/27/18 0538 05/29/18 0455  ALBUMIN 2.3* 2.5* 2.6* 2.7*   No results for input(s): LIPASE, AMYLASE in the last 168 hours. No results for input(s): AMMONIA in the last 168 hours.  CBC: Recent Labs  Lab 05/23/18 0507 05/25/18 1200 05/25/18 1440 05/27/18 0538 05/29/18 0455  WBC 12.4* 22.4* 23.7* 21.2* 17.9*  HGB 9.0* 10.4* 10.4* 9.9* 10.3*  HCT 28.5* 33.4* 33.3* 33.0* 34.2*  MCV 90.5 91.5 91.2 94.0 93.7  PLT 410* 550*  511* 562* 554*    Cardiac Enzymes: No results for input(s): CKTOTAL, CKMB, CKMBINDEX, TROPONINI in the last 168 hours.  BNP (last 3 results) No results for input(s): BNP in the last 8760 hours.  ProBNP (last 3 results) No results for input(s): PROBNP in the last 8760 hours.  Radiological Exams: Dg Chest Port 1 View  Result Date: 05/28/2018 CLINICAL DATA:  Nasogastric tube placement. EXAM: PORTABLE CHEST 1 VIEW COMPARISON:  Radiograph of May 25, 2018. FINDINGS: Stable cardiomegaly. Tracheostomy tube and nasogastric tube are unchanged in position. Left internal jugular dialysis catheter is noted with tip in expected position of right atrium. Central pulmonary vascular congestion is noted with possible minimal bilateral pulmonary edema. No pneumothorax or significant pleural effusion is noted. Bony thorax is unremarkable. IMPRESSION: Stable support apparatus. Stable cardiomegaly with central pulmonary vascular congestion. Stable minimal bilateral pulmonary edema. Electronically Signed   By: Lupita Raider, M.D.   On: 05/28/2018 10:42   Dg Abd Portable 1v  Result Date: 05/28/2018 CLINICAL DATA:  Initial evaluation for NG tube placement EXAM: PORTABLE ABDOMEN - 1 VIEW COMPARISON:  Prior radiograph from earlier the same day. FINDINGS: NG tube in place, tip in side hole overlying the mid/lower left abdomen, likely within the low lying stomach. Gaseous distension of the sigmoid colon within the lower abdomen, similar to previous. No significant small  bowel dilatation. Small layering bilateral pleural effusions. Associated left basilar opacity likely atelectasis. IMPRESSION: 1. Enteric tube in place with tip and side hole overlying the mid and lower left abdomen, likely within the low lying stomach as seen on prior cross-sectional imaging. 2. Small bilateral pleural effusions. Associated left basilar opacity likely reflects atelectasis. Electronically Signed   By: Rise MuBenjamin  McClintock M.D.   On:  05/28/2018 22:53   Dg Abd Portable 1v  Result Date: 05/28/2018 CLINICAL DATA:  NG tube placement. EXAM: PORTABLE ABDOMEN - 1 VIEW COMPARISON:  Plain film the abdomen from earlier same day. FINDINGS: There is now no enteric tube appreciated within the upper abdomen. Prominent gas-filled loops of bowel within the lower abdomen appear stable, presumed colon. IMPRESSION: No enteric tube appreciated within the upper abdomen. Electronically Signed   By: Bary RichardStan  Maynard M.D.   On: 05/28/2018 19:40   Dg Abd Portable 1v  Result Date: 05/28/2018 CLINICAL DATA:  Nasogastric tube. EXAM: PORTABLE ABDOMEN - 1 VIEW COMPARISON:  Radiograph of April 30, 2018. FINDINGS: The bowel gas pattern is normal. Nasogastric tube tip is seen in expected position of distal stomach. No radio-opaque calculi or other significant radiographic abnormality are seen. IMPRESSION: Nasogastric tube tip seen in expected position of distal stomach. No definite evidence of bowel obstruction or ileus. Electronically Signed   By: Lupita RaiderJames  Green Jr, M.D.   On: 05/28/2018 10:43    Assessment/Plan Principal Problem:   Acute on chronic respiratory failure with hypoxia (HCC) Active Problems:   Aspiration pneumonia (HCC)   Seizure disorder (HCC)   Cardiac arrest (HCC)   End stage renal disease on dialysis (HCC)   Chronic systolic heart failure (HCC)   1. Acute on chronic respiratory failure with hypoxia continue with the weaning on T collar and PMV.  Secretions are fair but she is got a good strong cough. 2. Pneumonia due to aspiration continue with aspiration precautions 3. Seizure disorder no active seizures noted 4. Cardiac arrest stable no arrhythmias 5. End-stage renal disease on dialysis followed by nephrology 6. Chronic systolic heart failure right now is compensated   I have personally seen and evaluated the patient, evaluated laboratory and imaging results, formulated the assessment and plan and placed orders. The Patient requires  high complexity decision making for assessment and support.  Case was discussed on Rounds with the Respiratory Therapy Staff  Yevonne PaxSaadat A Khan, MD [facility] Pulmonary Critical Care Medicine Sleep Medicine

## 2018-05-30 DIAGNOSIS — I5022 Chronic systolic (congestive) heart failure: Secondary | ICD-10-CM | POA: Diagnosis not present

## 2018-05-30 DIAGNOSIS — J690 Pneumonitis due to inhalation of food and vomit: Secondary | ICD-10-CM | POA: Diagnosis not present

## 2018-05-30 DIAGNOSIS — I469 Cardiac arrest, cause unspecified: Secondary | ICD-10-CM | POA: Diagnosis not present

## 2018-05-30 DIAGNOSIS — J9621 Acute and chronic respiratory failure with hypoxia: Secondary | ICD-10-CM | POA: Diagnosis not present

## 2018-05-30 LAB — HEPATITIS PANEL, ACUTE
HEP A IGM: NEGATIVE
HEP B C IGM: NEGATIVE
Hepatitis B Surface Ag: NEGATIVE

## 2018-05-30 LAB — CULTURE, BLOOD (ROUTINE X 2)
CULTURE: NO GROWTH
Culture: NO GROWTH

## 2018-05-30 NOTE — Progress Notes (Signed)
[facility] [place]   PULMONARY CRITICAL CARE SERVICE  PROGRESS NOTE  Date of Service: 05/30/2018  Lori Gutierrez  [id number]  DOB: [birth year]   DOA: 06/02/18  Referring Physician: Carron CurieAli Hijazi, MD  HPI: Lori Gutierrez is a 37 y.o. female seen for follow up of Acute on Chronic Respiratory Failure.  Patient is on T collar.  Patient had been downsized to a #4 trach however she still has significant secretions is not clearing them so recommended to change back up to a #6 trach at this time  Medications: Reviewed on Rounds  Physical Exam:  Vitals: Temperature 97.1 pulse 67 respiratory rate 18 blood pressure 135/85 saturations 93%  Ventilator Settings off the ventilator on T collar  . General: Comfortable at this time . Eyes: Grossly normal lids, irises & conjunctiva . ENT: grossly tongue is normal . Neck: no obvious mass . Cardiovascular: S1 S2 normal no gallop . Respiratory: Scattered rhonchi noted bilaterally . Abdomen: soft . Skin: no rash seen on limited exam . Musculoskeletal: not rigid . Psychiatric:unable to assess . Neurologic: no seizure no involuntary movements         Lab Data:   Basic Metabolic Panel: Recent Labs  Lab 05/25/18 1200 05/27/18 0538 05/29/18 0455  NA 135 133* 135  K 3.7 3.6 3.8  CL 93* 95* 98  CO2 27 25 26   GLUCOSE 100* 118* 122*  BUN 62* 51* 45*  CREATININE 3.77* 3.09* 3.11*  CALCIUM 9.2 9.3 9.4  MG  --  2.3  --   PHOS 3.5 3.1 3.4    Liver Function Tests: Recent Labs  Lab 05/25/18 1200 05/27/18 0538 05/29/18 0455  ALBUMIN 2.5* 2.6* 2.7*   No results for input(s): LIPASE, AMYLASE in the last 168 hours. No results for input(s): AMMONIA in the last 168 hours.  CBC: Recent Labs  Lab 05/25/18 1200 05/25/18 1440 05/27/18 0538 05/29/18 0455  WBC 22.4* 23.7* 21.2* 17.9*  HGB 10.4* 10.4* 9.9* 10.3*  HCT 33.4* 33.3* 33.0* 34.2*  MCV 91.5 91.2 94.0 93.7  PLT 550* 511* 562* 554*     Cardiac Enzymes: No results for input(s): CKTOTAL, CKMB, CKMBINDEX, TROPONINI in the last 168 hours.  BNP (last 3 results) No results for input(s): BNP in the last 8760 hours.  ProBNP (last 3 results) No results for input(s): PROBNP in the last 8760 hours.  Radiological Exams: Dg Abd Portable 1v  Result Date: 05/28/2018 CLINICAL DATA:  Initial evaluation for NG tube placement EXAM: PORTABLE ABDOMEN - 1 VIEW COMPARISON:  Prior radiograph from earlier the same day. FINDINGS: NG tube in place, tip in side hole overlying the mid/lower left abdomen, likely within the low lying stomach. Gaseous distension of the sigmoid colon within the lower abdomen, similar to previous. No significant small bowel dilatation. Small layering bilateral pleural effusions. Associated left basilar opacity likely atelectasis. IMPRESSION: 1. Enteric tube in place with tip and side hole overlying the mid and lower left abdomen, likely within the low lying stomach as seen on prior cross-sectional imaging. 2. Small bilateral pleural effusions. Associated left basilar opacity likely reflects atelectasis. Electronically Signed   By: Rise MuBenjamin  McClintock M.D.   On: 05/28/2018 22:53   Dg Abd Portable 1v  Result Date: 05/28/2018 CLINICAL DATA:  NG tube placement. EXAM: PORTABLE ABDOMEN - 1 VIEW COMPARISON:  Plain film the abdomen from earlier same day. FINDINGS: There is now no enteric tube appreciated within the upper abdomen. Prominent gas-filled loops of bowel  within the lower abdomen appear stable, presumed colon. IMPRESSION: No enteric tube appreciated within the upper abdomen. Electronically Signed   By: Bary Richard M.D.   On: 05/28/2018 19:40    Assessment/Plan Principal Problem:   Acute on chronic respiratory failure with hypoxia (HCC) Active Problems:   Aspiration pneumonia (HCC)   Seizure disorder (HCC)   Cardiac arrest (HCC)   End stage renal disease on dialysis (HCC)   Chronic systolic heart failure  (HCC)   1. Acute on chronic respiratory failure with hypoxia continue with the weaning on T collar change trach to #6 as mentioned. 2. Pneumonia due to aspiration treated with antibiotics we will continue to follow 3. Seizure disorder treated no active seizure noted 4. Cardiac arrest at baseline 5. End-stage renal disease on dialysis followed by nephrology 6. Chronic systolic heart failure stable continue to monitor   I have personally seen and evaluated the patient, evaluated laboratory and imaging results, formulated the assessment and plan and placed orders. The Patient requires high complexity decision making for assessment and support.  Case was discussed on Rounds with the Respiratory Therapy Staff  Yevonne Pax, MD [facility] Pulmonary Critical Care Medicine Sleep Medicine

## 2018-05-31 ENCOUNTER — Other Ambulatory Visit (HOSPITAL_COMMUNITY): Payer: Self-pay

## 2018-05-31 DIAGNOSIS — J690 Pneumonitis due to inhalation of food and vomit: Secondary | ICD-10-CM | POA: Diagnosis not present

## 2018-05-31 DIAGNOSIS — I5022 Chronic systolic (congestive) heart failure: Secondary | ICD-10-CM | POA: Diagnosis not present

## 2018-05-31 DIAGNOSIS — I469 Cardiac arrest, cause unspecified: Secondary | ICD-10-CM | POA: Diagnosis not present

## 2018-05-31 DIAGNOSIS — J9621 Acute and chronic respiratory failure with hypoxia: Secondary | ICD-10-CM | POA: Diagnosis not present

## 2018-05-31 LAB — BLOOD GAS, ARTERIAL
ACID-BASE EXCESS: 0.4 mmol/L (ref 0.0–2.0)
Acid-Base Excess: 0.7 mmol/L (ref 0.0–2.0)
BICARBONATE: 26.1 mmol/L (ref 20.0–28.0)
Bicarbonate: 26.4 mmol/L (ref 20.0–28.0)
FIO2: 80
FIO2: 50
LHR: 10 {breaths}/min
MECHVT: 380 mL
O2 Saturation: 91.8 %
O2 Saturation: 96.2 %
PATIENT TEMPERATURE: 98.6
PCO2 ART: 55.4 mmHg — AB (ref 32.0–48.0)
PEEP: 5 cmH2O
Patient temperature: 98.6
pCO2 arterial: 54.9 mmHg — ABNORMAL HIGH (ref 32.0–48.0)
pH, Arterial: 7.299 — ABNORMAL LOW (ref 7.350–7.450)
pH, Arterial: 7.298 — ABNORMAL LOW (ref 7.350–7.450)
pO2, Arterial: 64.6 mmHg — ABNORMAL LOW (ref 83.0–108.0)
pO2, Arterial: 83.2 mmHg (ref 83.0–108.0)

## 2018-05-31 NOTE — Progress Notes (Signed)
Pulmonary Critical Care Medicine [facility] [place]   PULMONARY CRITICAL CARE SERVICE  PROGRESS NOTE  Date of Service: 05/31/2018  Lori Gutierrez  NLG:[id number]  DOB: 10-20-1980   DOA: Jun 16, 2018  Referring Physician: Carron Curie, MD  HPI: Lori Gutierrez is a 37 y.o. female seen for follow up of Acute on Chronic Respiratory Failure.  Patient has change of status.  Had increased oxygen requirements up to 80%.  Which she is on T collar.  Also had to have the trach changed to a cuffed trach.  Chest x-ray was done shows probable effusion versus mucous plugging.  I suggested that we do a CT of the chest.  This will be able to tell as well as she is has mucous plug.  Respiratory reports that they did not see any increase in secretions and she was bagged without really much improvement.  Medications: Reviewed on Rounds  Physical Exam:  Vitals: Temperature 99.0 pulse 79 respiratory rate 18 blood pressure 152/100 saturations 96%.  Ventilator Settings off the ventilator on T collar FiO2 80%  . General: Comfortable at this time . Eyes: Grossly normal lids, irises & conjunctiva . ENT: grossly tongue is normal . Neck: no obvious mass . Cardiovascular: S1 S2 normal no gallop . Respiratory: Scattered rhonchi diminished on the left . Abdomen: soft . Skin: no rash seen on limited exam . Musculoskeletal: not rigid . Psychiatric:unable to assess . Neurologic: no seizure no involuntary movements         Lab Data:   Basic Metabolic Panel: Recent Labs  Lab 05/25/18 1200 05/27/18 0538 05/29/18 0455  NA 135 133* 135  K 3.7 3.6 3.8  CL 93* 95* 98  CO2 27 25 26   GLUCOSE 100* 118* 122*  BUN 62* 51* 45*  CREATININE 3.77* 3.09* 3.11*  CALCIUM 9.2 9.3 9.4  MG  --  2.3  --   PHOS 3.5 3.1 3.4    ABG: No results for input(s): PHART, PCO2ART, PO2ART, HCO3, O2SAT in the last 168 hours.  Liver Function Tests: Recent Labs  Lab 05/25/18 1200 05/27/18 0538 05/29/18 0455  ALBUMIN  2.5* 2.6* 2.7*   No results for input(s): LIPASE, AMYLASE in the last 168 hours. No results for input(s): AMMONIA in the last 168 hours.  CBC: Recent Labs  Lab 05/25/18 1200 05/25/18 1440 05/27/18 0538 05/29/18 0455  WBC 22.4* 23.7* 21.2* 17.9*  HGB 10.4* 10.4* 9.9* 10.3*  HCT 33.4* 33.3* 33.0* 34.2*  MCV 91.5 91.2 94.0 93.7  PLT 550* 511* 562* 554*    Cardiac Enzymes: No results for input(s): CKTOTAL, CKMB, CKMBINDEX, TROPONINI in the last 168 hours.  BNP (last 3 results) No results for input(s): BNP in the last 8760 hours.  ProBNP (last 3 results) No results for input(s): PROBNP in the last 8760 hours.  Radiological Exams: Dg Chest Port 1 View  Result Date: 05/31/2018 CLINICAL DATA:  37 year old female with oxygen desaturation EXAM: PORTABLE CHEST 1 VIEW COMPARISON:  Prior chest x-ray 05/28/2018 FINDINGS: Stable position of tracheostomy tube. Left IJ tunneled split tip hemodialysis catheter with the tips overlying the upper right atrium. Stable cardiomegaly. Progressive left basilar airspace opacity now obscuring the diaphragm. Veil like opacity overlying the right lung base. Moderate bilateral perihilar interstitial airspace opacities. No evidence of pneumothorax. No acute osseous abnormality. IMPRESSION: Similar appearance of the chest with perhaps slightly increased confluent opacity in the left lung base favored to reflect a combination of pleural effusion and atelectasis. Superimposed pneumonia is difficult to  exclude entirely. Small layering right pleural effusion and associated atelectasis. Stable and satisfactory support apparatus. Electronically Signed   By: Malachy Moan M.D.   On: 05/31/2018 11:08   Dg Abd Portable 1v  Result Date: 05/31/2018 CLINICAL DATA:  37 year old female with ileus EXAM: PORTABLE ABDOMEN - 1 VIEW COMPARISON:  Abdominal radiograph obtained 05/28/2018 FINDINGS: A gastric tube projects over the left abdomen. The tip of the tube likely projects  over the mid gastric body which lies quite low in the abdomen. Persistent mild gaseous distension of the colon. Rotary and levoconvex scoliosis again noted. IMPRESSION: 1. Stable position of gastric tube. 2. Persisting mild gaseous distension of the colon. Electronically Signed   By: Malachy Moan M.D.   On: 05/31/2018 11:06    Assessment/Plan Principal Problem:   Acute on chronic respiratory failure with hypoxia (HCC) Active Problems:   Aspiration pneumonia (HCC)   Seizure disorder (HCC)   Cardiac arrest (HCC)   End stage renal disease on dialysis (HCC)   Chronic systolic heart failure (HCC)   1. Acute on chronic respiratory failure with hypoxia we will get a CT scan to assess for possibility of mucous plug versus effusion on the left.  If effusion then she will need a thoracentesis. 2. Pneumonia due to aspiration will continue with supportive care 3. Seizure disorder no active seizures 4. Cardiac arrest at baseline at this time. 5. End-stage renal disease on hemodialysis 6. Chronic systolic heart failure continue with removal of fluid by dialysis prognosis is guarded   I have personally seen and evaluated the patient, evaluated laboratory and imaging results, formulated the assessment and plan and placed orders. The Patient requires high complexity decision making for assessment and support.  Case was discussed on Rounds with the Respiratory Therapy Staff time 35 minutes extended review of the medical record discussion with staff on rounds and change in condition  Yevonne Pax, MD [facility] Pulmonary Critical Care Medicine Sleep Medicine

## 2018-05-31 DEATH — deceased

## 2018-06-01 ENCOUNTER — Encounter: Payer: Self-pay | Admitting: Radiology

## 2018-06-01 ENCOUNTER — Other Ambulatory Visit (HOSPITAL_COMMUNITY): Payer: Self-pay

## 2018-06-01 DIAGNOSIS — J690 Pneumonitis due to inhalation of food and vomit: Secondary | ICD-10-CM | POA: Diagnosis not present

## 2018-06-01 DIAGNOSIS — J9621 Acute and chronic respiratory failure with hypoxia: Secondary | ICD-10-CM | POA: Diagnosis not present

## 2018-06-01 DIAGNOSIS — I469 Cardiac arrest, cause unspecified: Secondary | ICD-10-CM | POA: Diagnosis not present

## 2018-06-01 DIAGNOSIS — I5022 Chronic systolic (congestive) heart failure: Secondary | ICD-10-CM | POA: Diagnosis not present

## 2018-06-01 LAB — BLOOD GAS, ARTERIAL
Acid-Base Excess: 0.2 mmol/L (ref 0.0–2.0)
Bicarbonate: 25.4 mmol/L (ref 20.0–28.0)
FIO2: 50
O2 Saturation: 98.2 %
PEEP: 5 cmH2O
PO2 ART: 98.6 mmHg (ref 83.0–108.0)
Patient temperature: 96.5
RATE: 14 resp/min
VT: 380 mL
pCO2 arterial: 46.5 mmHg (ref 32.0–48.0)
pH, Arterial: 7.35 (ref 7.350–7.450)

## 2018-06-01 LAB — RENAL FUNCTION PANEL
Albumin: 2.3 g/dL — ABNORMAL LOW (ref 3.5–5.0)
Anion gap: 11 (ref 5–15)
BUN: 62 mg/dL — ABNORMAL HIGH (ref 6–20)
CALCIUM: 9 mg/dL (ref 8.9–10.3)
CO2: 26 mmol/L (ref 22–32)
CREATININE: 3.87 mg/dL — AB (ref 0.44–1.00)
Chloride: 95 mmol/L — ABNORMAL LOW (ref 98–111)
GFR, EST AFRICAN AMERICAN: 16 mL/min — AB (ref >60)
GFR, EST NON AFRICAN AMERICAN: 14 mL/min — AB (ref >60)
Glucose, Bld: 108 mg/dL — ABNORMAL HIGH (ref 70–99)
PHOSPHORUS: 3.9 mg/dL (ref 2.5–4.6)
Potassium: 4.1 mmol/L (ref 3.5–5.1)
SODIUM: 132 mmol/L — AB (ref 135–145)

## 2018-06-01 LAB — CBC
HCT: 32.5 % — ABNORMAL LOW (ref 36.0–46.0)
Hemoglobin: 9.6 g/dL — ABNORMAL LOW (ref 12.0–15.0)
MCH: 28.2 pg (ref 26.0–34.0)
MCHC: 29.5 g/dL — ABNORMAL LOW (ref 30.0–36.0)
MCV: 95.3 fL (ref 78.0–100.0)
PLATELETS: 510 10*3/uL — AB (ref 150–400)
RBC: 3.41 MIL/uL — AB (ref 3.87–5.11)
RDW: 18.4 % — ABNORMAL HIGH (ref 11.5–15.5)
WBC: 16 10*3/uL — AB (ref 4.0–10.5)

## 2018-06-01 MED ORDER — IOHEXOL 300 MG/ML  SOLN
75.0000 mL | Freq: Once | INTRAMUSCULAR | Status: AC | PRN
  Administered 2018-06-01: 75 mL via INTRAVENOUS

## 2018-06-01 NOTE — Progress Notes (Signed)
[facility]  ROUNDING NOTE   Subjective:  Patient seen at bedside. She has completed dialysis. Ultra filtration achieved was 2.9 kg. Respiratory status has worsened and she is now back on the ventilator with FiO2 of 50%.   Objective:  Vital signs in last 24 hours:  Temperature 96.5 pulse 72 respiration 17 blood pressure 126/75  Physical Exam: General: No acute distress  Head: [place]/AT NG in place  Eyes: Anicteric  Neck: Tracheostomy in place  Lungs:  Scattered rhonchi, vent assisted  Heart: S1S2 no rubs  Abdomen:  Soft, nontender, bowel sounds present  Extremities: trace peripheral edema.  Neurologic: Awake, follows commands  Skin: No lesions  Access: L IJ permcath    Basic Metabolic Panel: Recent Labs  Lab 05/25/18 1200 05/27/18 0538 05/29/18 0455 06/01/18 0455  NA 135 133* 135 132*  K 3.7 3.6 3.8 4.1  CL 93* 95* 98 95*  CO2 27 25 26 26   GLUCOSE 100* 118* 122* 108*  BUN 62* 51* 45* 62*  CREATININE 3.77* 3.09* 3.11* 3.87*  CALCIUM 9.2 9.3 9.4 9.0  MG  --  2.3  --   --   PHOS 3.5 3.1 3.4 3.9    Liver Function Tests: Recent Labs  Lab 05/25/18 1200 05/27/18 0538 05/29/18 0455 06/01/18 0455  ALBUMIN 2.5* 2.6* 2.7* 2.3*   No results for input(s): LIPASE, AMYLASE in the last 168 hours. No results for input(s): AMMONIA in the last 168 hours.  CBC: Recent Labs  Lab 05/25/18 1200 05/25/18 1440 05/27/18 0538 05/29/18 0455 06/01/18 0455  WBC 22.4* 23.7* 21.2* 17.9* 16.0*  HGB 10.4* 10.4* 9.9* 10.3* 9.6*  HCT 33.4* 33.3* 33.0* 34.2* 32.5*  MCV 91.5 91.2 94.0 93.7 95.3  PLT 550* 511* 562* 554* 510*    Cardiac Enzymes: No results for input(s): CKTOTAL, CKMB, CKMBINDEX, TROPONINI in the last 168 hours.  BNP: Invalid input(s): POCBNP  CBG: No results for input(s): GLUCAP in the last 168 hours.  Microbiology: Results for orders placed or performed during the hospital encounter of 05/16/2018  Culture, respiratory (non-expectorated)      Status: None   Collection Time: 05/25/18  4:10 PM  Result Value Ref Range Status   Specimen Description TRACHEAL ASPIRATE  Final   Special Requests NONE  Final   Gram Stain   Final    RARE WBC PRESENT, PREDOMINANTLY PMN FEW GRAM POSITIVE COCCI Performed at [facility], [address] [place], [place] [address]    Culture   Final    MODERATE ESCHERICHIA COLI Confirmed Extended Spectrum Beta-Lactamase Producer (ESBL).  In bloodstream infections from ESBL organisms, carbapenems are preferred over piperacillin/tazobactam. They are shown to have a lower risk of mortality.    Report Status 05/28/2018 FINAL  Final   Organism ID, Bacteria ESCHERICHIA COLI  Final      Susceptibility   Escherichia coli - MIC*    AMPICILLIN >=32 RESISTANT Resistant     CEFAZOLIN >=64 RESISTANT Resistant     CEFEPIME RESISTANT Resistant     CEFTAZIDIME >=64 RESISTANT Resistant     CEFTRIAXONE >=64 RESISTANT Resistant     CIPROFLOXACIN >=4 RESISTANT Resistant     GENTAMICIN <=1 SENSITIVE Sensitive     IMIPENEM <=0.25 SENSITIVE Sensitive     TRIMETH/SULFA <=20 SENSITIVE Sensitive     AMPICILLIN/SULBACTAM >=32 RESISTANT Resistant     PIP/TAZO >=128 RESISTANT Resistant     Extended ESBL POSITIVE Resistant     * MODERATE ESCHERICHIA COLI  C difficile quick  scan w PCR reflex     Status: Abnormal   Collection Time: 05/25/18  4:27 PM  Result Value Ref Range Status   C Diff antigen POSITIVE (A) NEGATIVE Final   C Diff toxin NEGATIVE NEGATIVE Final   C Diff interpretation Results are indeterminate. See PCR results.  Final    Comment: Performed at [facility], [address] [place], [place] [address]  C. Diff by PCR, Reflexed     Status: Abnormal   Collection Time: 05/25/18  4:27 PM  Result Value Ref Range Status   Toxigenic C. Difficile by PCR POSITIVE (A) NEGATIVE Final    Comment: Positive for toxigenic C. difficile with little to no toxin production. Only treat if clinical presentation  suggests symptomatic illness. Performed at [facility], [address], [place], [place] [address]   Culture, blood (routine x 2)     Status: None   Collection Time: 05/25/18  8:20 PM  Result Value Ref Range Status   Specimen Description BLOOD LEFT WRIST  Final   Special Requests   Final    BOTTLES DRAWN AEROBIC ONLY Blood Culture results may not be optimal due to an inadequate volume of blood received in culture bottles   Culture   Final    NO GROWTH 5 DAYS Performed at [facility], [address] [place], [place] [address]    Report Status 05/30/2018 FINAL  Final  Culture, blood (routine x 2)     Status: None   Collection Time: 05/25/18  8:28 PM  Result Value Ref Range Status   Specimen Description BLOOD LEFT HAND  Final   Special Requests   Final    BOTTLES DRAWN AEROBIC ONLY Blood Culture results may not be optimal due to an inadequate volume of blood received in culture bottles   Culture   Final    NO GROWTH 5 DAYS Performed at [facility], [address] [place], [place] [address]    Report Status 05/30/2018 FINAL  Final    Coagulation Studies: No results for input(s): LABPROT, INR in the last 72 hours.  Urinalysis: No results for input(s): COLORURINE, LABSPEC, PHURINE, GLUCOSEU, HGBUR, BILIRUBINUR, KETONESUR, PROTEINUR, UROBILINOGEN, NITRITE, LEUKOCYTESUR in the last 72 hours.  Invalid input(s): APPERANCEUR    Imaging: Dg Chest Port 1 View  Result Date: 05/31/2018 CLINICAL DATA:  37 year old female with oxygen desaturation EXAM: PORTABLE CHEST 1 VIEW COMPARISON:  Prior chest x-ray 05/28/2018 FINDINGS: Stable position of tracheostomy tube. Left IJ tunneled split tip hemodialysis catheter with the tips overlying the upper right atrium. Stable cardiomegaly. Progressive left basilar airspace opacity now obscuring the diaphragm. Veil like opacity overlying the right lung base. Moderate bilateral perihilar interstitial airspace opacities. No evidence of  pneumothorax. No acute osseous abnormality. IMPRESSION: Similar appearance of the chest with perhaps slightly increased confluent opacity in the left lung base favored to reflect a combination of pleural effusion and atelectasis. Superimposed pneumonia is difficult to exclude entirely. Small layering right pleural effusion and associated atelectasis. Stable and satisfactory support apparatus. Electronically Signed   By: Malachy Moan M.D.   On: 05/31/2018 11:08   Dg Abd Portable 1v  Result Date: 05/31/2018 CLINICAL DATA:  37 year old female with ileus EXAM: PORTABLE ABDOMEN - 1 VIEW COMPARISON:  Abdominal radiograph obtained 05/28/2018 FINDINGS: A gastric tube projects over the left abdomen. The tip of the tube likely projects over the mid gastric body which lies quite low in the abdomen. Persistent mild gaseous  distension of the colon. Rotary and levoconvex scoliosis again noted. IMPRESSION: 1. Stable position of gastric tube. 2. Persisting mild gaseous distension of the colon. Electronically Signed   By: Malachy Moan M.D.   On: 05/31/2018 11:06     Medications:       Assessment/ Plan:  37 y.o. female with a PMHx of recent acute respiratory failure requiring intubation, aspiration pneumonia, cardiac arrest, chronic systolic heart failure, delay of cognitive development, end-stage renal disease on hemodialysis MWF, hypertension, seizure disorder, who was admitted to [facility] on 04/30/2018 for ongoing management of acute respiratory failure, recent PEA arrest, and end-stage renal disease.   1.  ESRD on HD.    Patient completed hemodialysis today and ultrafiltration achieved was 2.9 kg.  Next dialysis on Wednesday.  2.  Acute respiratory failure.  Secondary to aspiration.   -Worsening respiratory status noted.  Patient now back on the ventilator with FiO2 of 50%.  Continue ultrafiltration with dialysis to aid weaning from the ventilator.  3.  Anemia of chronic kidney disease.   Hemoglobin was down a bit to 9.6 is likely hemodilution all.  We plan to repeat CBC on Wednesday.  4.  Secondary hyperparathyroidism.  Phosphorus at target at 3.9.  5.  Hypertension:     Continue current doses of amlodipine, clonidine, hydralazine, lisinopril, and metoprolol.  Blood pressure currently under good control.   LOS: 0 Arjan Strohm 9/2/201911:33 AM

## 2018-06-01 NOTE — Progress Notes (Signed)
Pulmonary Critical Care Medicine [facility] [place]   PULMONARY CRITICAL CARE SERVICE  PROGRESS NOTE  Date of Service: 06/01/2018  Lori Gutierrez  MGN:[id number]  DOB: 08/12/1981   DOA: 05/17/2018  Referring Physician: Carron Curie, MD  HPI: Lori Gutierrez is a 37 y.o. female seen for follow up of Acute on Chronic Respiratory Failure.  Chest x-ray still showing significant loss of volume on the left side.  Patient still requiring full vent support currently is on 50% oxygen.  No CT done yet because we do not have any IV access.  Medications: Reviewed on Rounds  Physical Exam:  Vitals: Temperature 96.8 pulse 72 respiratory rate 17 blood pressure 126/75 saturations 96%  Ventilator Settings mode of ventilation assist control FiO2 50% tidal volume 395 PEEP 5  . General: Comfortable at this time . Eyes: Grossly normal lids, irises & conjunctiva . ENT: grossly tongue is normal . Neck: no obvious mass . Cardiovascular: S1 S2 normal no gallop . Respiratory: Coarse breath sounds few rhonchi . Abdomen: soft . Skin: no rash seen on limited exam . Musculoskeletal: not rigid . Psychiatric:unable to assess . Neurologic: no seizure no involuntary movements         Lab Data:   Basic Metabolic Panel: Recent Labs  Lab 05/27/18 0538 05/29/18 0455 06/01/18 0455  NA 133* 135 132*  K 3.6 3.8 4.1  CL 95* 98 95*  CO2 25 26 26   GLUCOSE 118* 122* 108*  BUN 51* 45* 62*  CREATININE 3.09* 3.11* 3.87*  CALCIUM 9.3 9.4 9.0  MG 2.3  --   --   PHOS 3.1 3.4 3.9    ABG: Recent Labs  Lab 05/31/18 1415 05/31/18 1539 06/01/18 0415  PHART 7.299* 7.298* 7.350  PCO2ART 55.4* 54.9* 46.5  PO2ART 64.6* 83.2 98.6  HCO3 26.4 26.1 25.4  O2SAT 91.8 96.2 98.2    Liver Function Tests: Recent Labs  Lab 05/27/18 0538 05/29/18 0455 06/01/18 0455  ALBUMIN 2.6* 2.7* 2.3*   No results for input(s): LIPASE, AMYLASE in the last 168 hours. No results for input(s): AMMONIA in the last 168  hours.  CBC: Recent Labs  Lab 05/27/18 0538 05/29/18 0455 06/01/18 0455  WBC 21.2* 17.9* 16.0*  HGB 9.9* 10.3* 9.6*  HCT 33.0* 34.2* 32.5*  MCV 94.0 93.7 95.3  PLT 562* 554* 510*    Cardiac Enzymes: No results for input(s): CKTOTAL, CKMB, CKMBINDEX, TROPONINI in the last 168 hours.  BNP (last 3 results) No results for input(s): BNP in the last 8760 hours.  ProBNP (last 3 results) No results for input(s): PROBNP in the last 8760 hours.  Radiological Exams: Dg Chest Port 1 View  Result Date: 05/31/2018 CLINICAL DATA:  37 year old female with oxygen desaturation EXAM: PORTABLE CHEST 1 VIEW COMPARISON:  Prior chest x-ray 05/28/2018 FINDINGS: Stable position of tracheostomy tube. Left IJ tunneled split tip hemodialysis catheter with the tips overlying the upper right atrium. Stable cardiomegaly. Progressive left basilar airspace opacity now obscuring the diaphragm. Veil like opacity overlying the right lung base. Moderate bilateral perihilar interstitial airspace opacities. No evidence of pneumothorax. No acute osseous abnormality. IMPRESSION: Similar appearance of the chest with perhaps slightly increased confluent opacity in the left lung base favored to reflect a combination of pleural effusion and atelectasis. Superimposed pneumonia is difficult to exclude entirely. Small layering right pleural effusion and associated atelectasis. Stable and satisfactory support apparatus. Electronically Signed   By: Malachy Moan M.D.   On: 05/31/2018 11:08   Dg Abd  Portable 1v  Result Date: 05/31/2018 CLINICAL DATA:  37 year old female with ileus EXAM: PORTABLE ABDOMEN - 1 VIEW COMPARISON:  Abdominal radiograph obtained 05/28/2018 FINDINGS: A gastric tube projects over the left abdomen. The tip of the tube likely projects over the mid gastric body which lies quite low in the abdomen. Persistent mild gaseous distension of the colon. Rotary and levoconvex scoliosis again noted. IMPRESSION: 1. Stable  position of gastric tube. 2. Persisting mild gaseous distension of the colon. Electronically Signed   By: Malachy Moan M.D.   On: 05/31/2018 11:06    Assessment/Plan Principal Problem:   Acute on chronic respiratory failure with hypoxia (HCC) Active Problems:   Aspiration pneumonia (HCC)   Seizure disorder (HCC)   Cardiac arrest (HCC)   End stage renal disease on dialysis (HCC)   Chronic systolic heart failure (HCC)   1. Acute on chronic respiratory failure with hypoxia patient is right now on full support on the ventilator FiO2 is at 50% now we will try to continue to wean down as tolerated.  No further weaning until we get the CT results to try to figure out where she has volume loss on the left side. 2. Pneumonia due to aspiration treated with antibiotics we will continue with supportive care 3. Seizure disorder no active seizures noted at this time. 4. Cardiac arrest at baseline we will continue to follow 5. End-stage renal disease on dialysis patient was dialyzed today about 3 kg removed 6. Chronic systolic heart failure at baseline continue present management   I have personally seen and evaluated the patient, evaluated laboratory and imaging results, formulated the assessment and plan and placed orders. The Patient requires high complexity decision making for assessment and support.  Case was discussed on Rounds with the Respiratory Therapy Staff  Yevonne Pax, MD [facility] Pulmonary Critical Care Medicine Sleep Medicine

## 2018-06-02 DIAGNOSIS — J9621 Acute and chronic respiratory failure with hypoxia: Secondary | ICD-10-CM | POA: Diagnosis not present

## 2018-06-02 DIAGNOSIS — I469 Cardiac arrest, cause unspecified: Secondary | ICD-10-CM | POA: Diagnosis not present

## 2018-06-02 DIAGNOSIS — J690 Pneumonitis due to inhalation of food and vomit: Secondary | ICD-10-CM | POA: Diagnosis not present

## 2018-06-02 DIAGNOSIS — I5022 Chronic systolic (congestive) heart failure: Secondary | ICD-10-CM | POA: Diagnosis not present

## 2018-06-02 NOTE — Progress Notes (Signed)
Pulmonary Critical Care Medicine [facility] [place]   PULMONARY CRITICAL CARE SERVICE  PROGRESS NOTE  Date of Service: 06/02/2018  Lori Gutierrez  YME:[id number]  DOB: [birth year]   DOA: 06/14/2018  Referring Physician: Carron Curie, MD  HPI: Lori Gutierrez is a 37 y.o. female seen for follow up of Acute on Chronic Respiratory Failure.  Patient was on 40% oxygen which is an improvement.  She still is on assist control sitting up in the chair looks good  Medications: Reviewed on Rounds  Physical Exam:  Vitals: Temperature 97.5 pulse 74 respiratory rate 17 blood pressure 120/73 saturations 96%  Ventilator Settings mode of ventilation assist control FiO2 40% tidal volume 351 PEEP 5  . General: Comfortable at this time . Eyes: Grossly normal lids, irises & conjunctiva . ENT: grossly tongue is normal . Neck: no obvious mass . Cardiovascular: S1 S2 normal no gallop . Respiratory: Coarse rhonchi noted scattered . Abdomen: soft . Skin: no rash seen on limited exam . Musculoskeletal: not rigid . Psychiatric:unable to assess . Neurologic: no seizure no involuntary movements         Lab Data:   Basic Metabolic Panel: Recent Labs  Lab 05/27/18 0538 05/29/18 0455 06/01/18 0455  NA 133* 135 132*  K 3.6 3.8 4.1  CL 95* 98 95*  CO2 25 26 26   GLUCOSE 118* 122* 108*  BUN 51* 45* 62*  CREATININE 3.09* 3.11* 3.87*  CALCIUM 9.3 9.4 9.0  MG 2.3  --   --   PHOS 3.1 3.4 3.9    ABG: Recent Labs  Lab 05/31/18 1415 05/31/18 1539 06/01/18 0415  PHART 7.299* 7.298* 7.350  PCO2ART 55.4* 54.9* 46.5  PO2ART 64.6* 83.2 98.6  HCO3 26.4 26.1 25.4  O2SAT 91.8 96.2 98.2    Liver Function Tests: Recent Labs  Lab 05/27/18 0538 05/29/18 0455 06/01/18 0455  ALBUMIN 2.6* 2.7* 2.3*   No results for input(s): LIPASE, AMYLASE in the last 168 hours. No results for input(s): AMMONIA in the last 168 hours.  CBC: Recent Labs  Lab 05/27/18 0538 05/29/18 0455 06/01/18 0455   WBC 21.2* 17.9* 16.0*  HGB 9.9* 10.3* 9.6*  HCT 33.0* 34.2* 32.5*  MCV 94.0 93.7 95.3  PLT 562* 554* 510*    Cardiac Enzymes: No results for input(s): CKTOTAL, CKMB, CKMBINDEX, TROPONINI in the last 168 hours.  BNP (last 3 results) No results for input(s): BNP in the last 8760 hours.  ProBNP (last 3 results) No results for input(s): PROBNP in the last 8760 hours.  Radiological Exams: Ct Chest W Contrast  Result Date: 06/01/2018 CLINICAL DATA:  Ventilator dependent. Prior cardiac arrest. Concern for mucous plugging or effusion. Acute on chronic respiratory failure. Volume loss in LEFT hemithorax. EXAM: CT CHEST WITH CONTRAST TECHNIQUE: Multidetector CT imaging of the chest was performed during intravenous contrast administration. CONTRAST:  43mL OMNIPAQUE IOHEXOL 300 MG/ML  SOLN COMPARISON:  Chest radiograph 05/31/2017, CT 05/27/2018 FINDINGS: Cardiovascular: No significant vascular findings. Normal heart size. No pericardial effusion. No pericardial effusion. Central venous line with tips in the distal SVC/proximal atrium. Mediastinum/Nodes: Scattered axillary and supraclavicular nodes unchanged. Mild mediastinal adenopathy unchanged. Feeding tube extends through the esophagus to the stomach. Tracheostomy tube in good position. Lungs/Pleura: Mild increase in fluid compared to comparison CT. The fluid volume is mild-to-moderate. There is mild atelectasis in the LEFT lower lobe similar to comparison exam although slightly increased. No evidence of mucous plugging. Small effusion in the RIGHT lung. Mild ground-glass opacities and interstitial  edema. Upper Abdomen: Limited view of the liver, kidneys, pancreas are unremarkable. Normal adrenal glands. Musculoskeletal: Diffuse sclerosis of the bones consistent medical renal disease. Scoliosis. IMPRESSION: 1. Degree of volume loss and pleural fluid in the LEFT hemithorax is not as dramatic as chest x-ray. There is mild increase in moderate atelectasis  and moderate pleural fluid at the LEFT lung base. No evidence of mucous plugging. 2. Mild interstitial edema and mild pulmonary edema. 3. Nonspecific mediastinal and axillary adenopathy again noted. 4. Renal osteodystrophy and scoliosis. Electronically Signed   By: Genevive Bi M.D.   On: 06/01/2018 18:25    Assessment/Plan Principal Problem:   Acute on chronic respiratory failure with hypoxia (HCC) Active Problems:   Aspiration pneumonia (HCC)   Seizure disorder (HCC)   Cardiac arrest (HCC)   End stage renal disease on dialysis (HCC)   Chronic systolic heart failure (HCC)   1. Acute on chronic respiratory failure with hypoxia patient is doing better with the ventilator today.  Spoke with respiratory therapy on rounds to go ahead and start weaning again we will place the patient straight to T-bar and fast track her for weaning. 2. Aspiration pneumonia treated with antibiotics we will continue to monitor. 3. Seizure disorder she has had no active seizures at this time.  We will continue with supportive care. 4. Cardiac arrest at baseline stable rhythm 5. End-stage renal disease on hemodialysis again working hopefully towards decannulation 6. Chronic systolic heart failure right now compensated mild monitor the fluid status.  Last x-ray showed mild interstitial edema.  Also she had a CT of the chest which did not show significant mucus plugging and some mild atelectasis.   I have personally seen and evaluated the patient, evaluated laboratory and imaging results, formulated the assessment and plan and placed orders. The Patient requires high complexity decision making for assessment and support.  Case was discussed on Rounds with the Respiratory Therapy Staff time 35 minutes review of radiological studies as well as discussion with the medical staff on multidisciplinary rounds  Yevonne Pax, MD [facility] Pulmonary Critical Care Medicine Sleep Medicine

## 2018-06-03 DIAGNOSIS — J9621 Acute and chronic respiratory failure with hypoxia: Secondary | ICD-10-CM | POA: Diagnosis not present

## 2018-06-03 DIAGNOSIS — I5022 Chronic systolic (congestive) heart failure: Secondary | ICD-10-CM | POA: Diagnosis not present

## 2018-06-03 DIAGNOSIS — J690 Pneumonitis due to inhalation of food and vomit: Secondary | ICD-10-CM | POA: Diagnosis not present

## 2018-06-03 DIAGNOSIS — I469 Cardiac arrest, cause unspecified: Secondary | ICD-10-CM | POA: Diagnosis not present

## 2018-06-03 LAB — RENAL FUNCTION PANEL
Albumin: 2.3 g/dL — ABNORMAL LOW (ref 3.5–5.0)
Anion gap: 11 (ref 5–15)
BUN: 66 mg/dL — AB (ref 6–20)
CO2: 25 mmol/L (ref 22–32)
Calcium: 9.4 mg/dL (ref 8.9–10.3)
Chloride: 92 mmol/L — ABNORMAL LOW (ref 98–111)
Creatinine, Ser: 3.92 mg/dL — ABNORMAL HIGH (ref 0.44–1.00)
GFR calc Af Amer: 16 mL/min — ABNORMAL LOW (ref >60)
GFR calc non Af Amer: 14 mL/min — ABNORMAL LOW (ref >60)
GLUCOSE: 115 mg/dL — AB (ref 70–99)
POTASSIUM: 4.1 mmol/L (ref 3.5–5.1)
Phosphorus: 3.6 mg/dL (ref 2.5–4.6)
Sodium: 128 mmol/L — ABNORMAL LOW (ref 135–145)

## 2018-06-03 LAB — CBC
HEMATOCRIT: 33 % — AB (ref 36.0–46.0)
HEMOGLOBIN: 9.9 g/dL — AB (ref 12.0–15.0)
MCH: 28 pg (ref 26.0–34.0)
MCHC: 30 g/dL (ref 30.0–36.0)
MCV: 93.5 fL (ref 78.0–100.0)
Platelets: 476 10*3/uL — ABNORMAL HIGH (ref 150–400)
RBC: 3.53 MIL/uL — ABNORMAL LOW (ref 3.87–5.11)
RDW: 18.1 % — AB (ref 11.5–15.5)
WBC: 16.2 10*3/uL — AB (ref 4.0–10.5)

## 2018-06-03 NOTE — Progress Notes (Signed)
[facility]  ROUNDING NOTE   Subjective:  Patient seen and evaluated during hemodialysis. Appears to be tolerating well.    Objective:  Vital signs in last 24 hours:  Temperature 97.7 pulse 77 respirations 18 blood pressure 164/92  Physical Exam: General: No acute distress  Head: /AT NG in place  Eyes: Anicteric  Neck: Tracheostomy in place  Lungs:  Scattered rhonchi, vent assisted  Heart: S1S2 no rubs  Abdomen:  Soft, nontender, bowel sounds present  Extremities: trace peripheral edema.  Neurologic: Awake, follows commands  Skin: No lesions  Access: L IJ permcath    Basic Metabolic Panel: Recent Labs  Lab 05/29/18 0455 06/01/18 0455 06/03/18 0529  NA 135 132* 128*  K 3.8 4.1 4.1  CL 98 95* 92*  CO2 26 26 25   GLUCOSE 122* 108* 115*  BUN 45* 62* 66*  CREATININE 3.11* 3.87* 3.92*  CALCIUM 9.4 9.0 9.4  PHOS 3.4 3.9 3.6    Liver Function Tests: Recent Labs  Lab 05/29/18 0455 06/01/18 0455 06/03/18 0529  ALBUMIN 2.7* 2.3* 2.3*   No results for input(s): LIPASE, AMYLASE in the last 168 hours. No results for input(s): AMMONIA in the last 168 hours.  CBC: Recent Labs  Lab 05/29/18 0455 06/01/18 0455 06/03/18 0529  WBC 17.9* 16.0* 16.2*  HGB 10.3* 9.6* 9.9*  HCT 34.2* 32.5* 33.0*  MCV 93.7 95.3 93.5  PLT 554* 510* 476*    Cardiac Enzymes: No results for input(s): CKTOTAL, CKMB, CKMBINDEX, TROPONINI in the last 168 hours.  BNP: Invalid input(s): POCBNP  CBG: No results for input(s): GLUCAP in the last 168 hours.  Microbiology: Results for orders placed or performed during the hospital encounter of 2018/05/29  Culture, respiratory (non-expectorated)     Status: None   Collection Time: 05/25/18  4:10 PM  Result Value Ref Range Status   Specimen Description TRACHEAL ASPIRATE  Final   Special Requests NONE  Final   Gram Stain   Final    RARE WBC PRESENT, PREDOMINANTLY PMN FEW GRAM POSITIVE COCCI Performed at [facility]  Lab, [address], [place], [place] [address]    Culture   Final    MODERATE ESCHERICHIA COLI Confirmed Extended Spectrum Beta-Lactamase Producer (ESBL).  In bloodstream infections from ESBL organisms, carbapenems are preferred over piperacillin/tazobactam. They are shown to have a lower risk of mortality.    Report Status 05/28/2018 FINAL  Final   Organism ID, Bacteria ESCHERICHIA COLI  Final      Susceptibility   Escherichia coli - MIC*    AMPICILLIN >=32 RESISTANT Resistant     CEFAZOLIN >=64 RESISTANT Resistant     CEFEPIME RESISTANT Resistant     CEFTAZIDIME >=64 RESISTANT Resistant     CEFTRIAXONE >=64 RESISTANT Resistant     CIPROFLOXACIN >=4 RESISTANT Resistant     GENTAMICIN <=1 SENSITIVE Sensitive     IMIPENEM <=0.25 SENSITIVE Sensitive     TRIMETH/SULFA <=20 SENSITIVE Sensitive     AMPICILLIN/SULBACTAM >=32 RESISTANT Resistant     PIP/TAZO >=128 RESISTANT Resistant     Extended ESBL POSITIVE Resistant     * MODERATE ESCHERICHIA COLI  C difficile quick scan w PCR reflex     Status: Abnormal   Collection Time: 05/25/18  4:27 PM  Result Value Ref Range Status   C Diff antigen POSITIVE (A) NEGATIVE Final   C Diff toxin NEGATIVE NEGATIVE Final   C Diff interpretation Results are indeterminate. See PCR results.  Final    Comment:  Performed at [facility] Lab, [address], [place], [place] [address]  C. Diff by PCR, Reflexed     Status: Abnormal   Collection Time: 05/25/18  4:27 PM  Result Value Ref Range Status   Toxigenic C. Difficile by PCR POSITIVE (A) NEGATIVE Final    Comment: Positive for toxigenic C. difficile with little to no toxin production. Only treat if clinical presentation suggests symptomatic illness. Performed at [facility] Lab, [address] [place], [place] [address]   Culture, blood (routine x 2)     Status: None   Collection Time: 05/25/18  8:20 PM  Result Value Ref Range Status   Specimen Description BLOOD LEFT WRIST  Final   Special  Requests   Final    BOTTLES DRAWN AEROBIC ONLY Blood Culture results may not be optimal due to an inadequate volume of blood received in culture bottles   Culture   Final    NO GROWTH 5 DAYS Performed at [facility] Lab, [address], [place], [place] [address]    Report Status 05/30/2018 FINAL  Final  Culture, blood (routine x 2)     Status: None   Collection Time: 05/25/18  8:28 PM  Result Value Ref Range Status   Specimen Description BLOOD LEFT HAND  Final   Special Requests   Final    BOTTLES DRAWN AEROBIC ONLY Blood Culture results may not be optimal due to an inadequate volume of blood received in culture bottles   Culture   Final    NO GROWTH 5 DAYS Performed at [facility] Lab, [address] [place], [place] [address]    Report Status 05/30/2018 FINAL  Final    Coagulation Studies: No results for input(s): LABPROT, INR in the last 72 hours.  Urinalysis: No results for input(s): COLORURINE, LABSPEC, PHURINE, GLUCOSEU, HGBUR, BILIRUBINUR, KETONESUR, PROTEINUR, UROBILINOGEN, NITRITE, LEUKOCYTESUR in the last 72 hours.  Invalid input(s): APPERANCEUR    Imaging: Ct Chest W Contrast  Result Date: 06/01/2018 CLINICAL DATA:  Ventilator dependent. Prior cardiac arrest. Concern for mucous plugging or effusion. Acute on chronic respiratory failure. Volume loss in LEFT hemithorax. EXAM: CT CHEST WITH CONTRAST TECHNIQUE: Multidetector CT imaging of the chest was performed during intravenous contrast administration. CONTRAST:  75mL OMNIPAQUE IOHEXOL 300 MG/ML  SOLN COMPARISON:  Chest radiograph 05/31/2017, CT 05/27/2018 FINDINGS: Cardiovascular: No significant vascular findings. Normal heart size. No pericardial effusion. No pericardial effusion. Central venous line with tips in the distal SVC/proximal atrium. Mediastinum/Nodes: Scattered axillary and supraclavicular nodes unchanged. Mild mediastinal adenopathy unchanged. Feeding tube extends through the esophagus to the stomach.  Tracheostomy tube in good position. Lungs/Pleura: Mild increase in fluid compared to comparison CT. The fluid volume is mild-to-moderate. There is mild atelectasis in the LEFT lower lobe similar to comparison exam although slightly increased. No evidence of mucous plugging. Small effusion in the RIGHT lung. Mild ground-glass opacities and interstitial edema. Upper Abdomen: Limited view of the liver, kidneys, pancreas are unremarkable. Normal adrenal glands. Musculoskeletal: Diffuse sclerosis of the bones consistent medical renal disease. Scoliosis. IMPRESSION: 1. Degree of volume loss and pleural fluid in the LEFT hemithorax is not as dramatic as chest x-ray. There is mild increase in moderate atelectasis and moderate pleural fluid at the LEFT lung base. No evidence of mucous plugging. 2. Mild interstitial edema and mild pulmonary edema. 3. Nonspecific mediastinal and axillary adenopathy again noted. 4. Renal osteodystrophy and scoliosis. Electronically Signed   By: Loura Halt.D.  On: 06/01/2018 18:25     Medications:       Assessment/ Plan:  37 y.o. female with a PMHx of recent acute respiratory failure requiring intubation, aspiration pneumonia, cardiac arrest, chronic systolic heart failure, delay of cognitive development, end-stage renal disease on hemodialysis MWF, hypertension, seizure disorder, who was admitted to [facility] on 04/30/2018 for ongoing management of acute respiratory failure, recent PEA arrest, and end-stage renal disease.   1.  ESRD on HD.    Patient seen and evaluated during hemodialysis today.  Tolerating well.  We plan to complete dialysis session.  Ultrafiltration target 2.5 kg.  2.  Acute respiratory failure.  Secondary to aspiration.   -Patient is required ventilatory support on and off.  Plan as per pulmonary/critical care.  3.  Anemia of chronic kidney disease.  Hemoglobin up to 9.9.  Continue to monitor.  4.  Secondary hyperparathyroidism.   Phosphorus remains acceptable at 3.6.  We will continue to monitor.  5.  Hypertension:     Continue current doses of amlodipine, clonidine, hydralazine, lisinopril, and metoprolol.     LOS: 0 Jeniah Kishi 9/4/20198:46 AM

## 2018-06-03 NOTE — Progress Notes (Signed)
Pulmonary Critical Care Medicine [facility] [place]   PULMONARY CRITICAL CARE SERVICE  PROGRESS NOTE  Date of Service: 06/03/2018  Lori Gutierrez  NWG:[id number]  DOB: [birth year]   DOA: 05/01/2018  Referring Physician: Carron Curie, MD  HPI: Lori Gutierrez is a 36 y.o. female seen for follow up of Acute on Chronic Respiratory Failure.  Patient right now is on pressure support mode was able to do about 10 hours of T collar.  Right now comfortable without distress  Medications: Reviewed on Rounds  Physical Exam:  Vitals: Temperature 97.7 pulse 77 respiratory rate 18 blood pressure 10/23/1960 saturations 97%  Ventilator Settings mode of ventilation pressure support FiO2 35% pressure support 12 PEEP 5  . General: Comfortable at this time . Eyes: Grossly normal lids, irises & conjunctiva . ENT: grossly tongue is normal . Neck: no obvious mass . Cardiovascular: S1 S2 normal no gallop . Respiratory: Scattered rhonchi . Abdomen: soft . Skin: no rash seen on limited exam . Musculoskeletal: not rigid . Psychiatric:unable to assess . Neurologic: no seizure no involuntary movements         Lab Data:   Basic Metabolic Panel: Recent Labs  Lab 05/29/18 0455 06/01/18 0455 06/03/18 0529  NA 135 132* 128*  K 3.8 4.1 4.1  CL 98 95* 92*  CO2 26 26 25   GLUCOSE 122* 108* 115*  BUN 45* 62* 66*  CREATININE 3.11* 3.87* 3.92*  CALCIUM 9.4 9.0 9.4  PHOS 3.4 3.9 3.6    ABG: Recent Labs  Lab 05/31/18 1415 05/31/18 1539 06/01/18 0415  PHART 7.299* 7.298* 7.350  PCO2ART 55.4* 54.9* 46.5  PO2ART 64.6* 83.2 98.6  HCO3 26.4 26.1 25.4  O2SAT 91.8 96.2 98.2    Liver Function Tests: Recent Labs  Lab 05/29/18 0455 06/01/18 0455 06/03/18 0529  ALBUMIN 2.7* 2.3* 2.3*   No results for input(s): LIPASE, AMYLASE in the last 168 hours. No results for input(s): AMMONIA in the last 168 hours.  CBC: Recent Labs  Lab 05/29/18 0455 06/01/18 0455 06/03/18 0529  WBC 17.9*  16.0* 16.2*  HGB 10.3* 9.6* 9.9*  HCT 34.2* 32.5* 33.0*  MCV 93.7 95.3 93.5  PLT 554* 510* 476*    Cardiac Enzymes: No results for input(s): CKTOTAL, CKMB, CKMBINDEX, TROPONINI in the last 168 hours.  BNP (last 3 results) No results for input(s): BNP in the last 8760 hours.  ProBNP (last 3 results) No results for input(s): PROBNP in the last 8760 hours.  Radiological Exams: Ct Chest W Contrast  Result Date: 06/01/2018 CLINICAL DATA:  Ventilator dependent. Prior cardiac arrest. Concern for mucous plugging or effusion. Acute on chronic respiratory failure. Volume loss in LEFT hemithorax. EXAM: CT CHEST WITH CONTRAST TECHNIQUE: Multidetector CT imaging of the chest was performed during intravenous contrast administration. CONTRAST:  75mL OMNIPAQUE IOHEXOL 300 MG/ML  SOLN COMPARISON:  Chest radiograph 05/31/2017, CT 05/27/2018 FINDINGS: Cardiovascular: No significant vascular findings. Normal heart size. No pericardial effusion. No pericardial effusion. Central venous line with tips in the distal SVC/proximal atrium. Mediastinum/Nodes: Scattered axillary and supraclavicular nodes unchanged. Mild mediastinal adenopathy unchanged. Feeding tube extends through the esophagus to the stomach. Tracheostomy tube in good position. Lungs/Pleura: Mild increase in fluid compared to comparison CT. The fluid volume is mild-to-moderate. There is mild atelectasis in the LEFT lower lobe similar to comparison exam although slightly increased. No evidence of mucous plugging. Small effusion in the RIGHT lung. Mild ground-glass opacities and interstitial edema. Upper Abdomen: Limited view of the liver, kidneys, pancreas  are unremarkable. Normal adrenal glands. Musculoskeletal: Diffuse sclerosis of the bones consistent medical renal disease. Scoliosis. IMPRESSION: 1. Degree of volume loss and pleural fluid in the LEFT hemithorax is not as dramatic as chest x-ray. There is mild increase in moderate atelectasis and moderate  pleural fluid at the LEFT lung base. No evidence of mucous plugging. 2. Mild interstitial edema and mild pulmonary edema. 3. Nonspecific mediastinal and axillary adenopathy again noted. 4. Renal osteodystrophy and scoliosis. Electronically Signed   By: Genevive Bi M.D.   On: 06/01/2018 18:25    Assessment/Plan Principal Problem:   Acute on chronic respiratory failure with hypoxia (HCC) Active Problems:   Aspiration pneumonia (HCC)   Seizure disorder (HCC)   Cardiac arrest (HCC)   End stage renal disease on dialysis (HCC)   Chronic systolic heart failure (HCC)   1. Acute on chronic respiratory failure with hypoxia right now she is resting after dialysis hopefully will be able to try the T collar wean once again today. 2. Pneumonia due to aspiration treated continue with present management 3. Seizure disorder unchanged we will continue present management no active seizures. 4. Cardiac arrest at baseline continue with supportive care 5. End-stage renal failure on dialysis 6. Chronic systolic heart failure at baseline   I have personally seen and evaluated the patient, evaluated laboratory and imaging results, formulated the assessment and plan and placed orders. The Patient requires high complexity decision making for assessment and support.  Case was discussed on Rounds with the Respiratory Therapy Staff  Yevonne Pax, MD [facility] Pulmonary Critical Care Medicine Sleep Medicine

## 2018-06-04 ENCOUNTER — Other Ambulatory Visit (HOSPITAL_COMMUNITY): Payer: Self-pay

## 2018-06-04 DIAGNOSIS — I469 Cardiac arrest, cause unspecified: Secondary | ICD-10-CM | POA: Diagnosis not present

## 2018-06-04 DIAGNOSIS — J690 Pneumonitis due to inhalation of food and vomit: Secondary | ICD-10-CM | POA: Diagnosis not present

## 2018-06-04 DIAGNOSIS — I5022 Chronic systolic (congestive) heart failure: Secondary | ICD-10-CM | POA: Diagnosis not present

## 2018-06-04 DIAGNOSIS — J9621 Acute and chronic respiratory failure with hypoxia: Secondary | ICD-10-CM | POA: Diagnosis not present

## 2018-06-04 NOTE — Progress Notes (Signed)
Pulmonary Critical Care Medicine [facility] [place]   PULMONARY CRITICAL CARE SERVICE  PROGRESS NOTE  Date of Service: 06/04/2018  Lori Gutierrez  GYJ:[id number]  DOB: 27-Jun-1981   DOA: May 30, 2018  Referring Physician: Carron Curie, MD  HPI: Lori Gutierrez is a 37 y.o. female seen for follow up of Acute on Chronic Respiratory Failure.  Patient is on T collar right now she is not able to eat she wants to eat however her speech has not cleared her.  She is going to need to have a PEG tube placed she has been pulling on her NG tube and I have explained to her that this could lead to significant trauma and bleeding.  Medications: Reviewed on Rounds  Physical Exam:  Vitals: Temperature 98.8 pulse 88 respiratory rate 22 blood pressure 136/88 saturations 93%  Ventilator Settings on T collar trials right now FiO2 35%  . General: Comfortable at this time . Eyes: Grossly normal lids, irises & conjunctiva . ENT: grossly tongue is normal . Neck: no obvious mass . Cardiovascular: S1 S2 normal no gallop . Respiratory: Scattered rhonchi noted . Abdomen: soft . Skin: no rash seen on limited exam . Musculoskeletal: not rigid . Psychiatric:unable to assess . Neurologic: no seizure no involuntary movements         Lab Data:   Basic Metabolic Panel: Recent Labs  Lab 05/29/18 0455 06/01/18 0455 06/03/18 0529  NA 135 132* 128*  K 3.8 4.1 4.1  CL 98 95* 92*  CO2 26 26 25   GLUCOSE 122* 108* 115*  BUN 45* 62* 66*  CREATININE 3.11* 3.87* 3.92*  CALCIUM 9.4 9.0 9.4  PHOS 3.4 3.9 3.6    ABG: Recent Labs  Lab 05/31/18 1415 05/31/18 1539 06/01/18 0415  PHART 7.299* 7.298* 7.350  PCO2ART 55.4* 54.9* 46.5  PO2ART 64.6* 83.2 98.6  HCO3 26.4 26.1 25.4  O2SAT 91.8 96.2 98.2    Liver Function Tests: Recent Labs  Lab 05/29/18 0455 06/01/18 0455 06/03/18 0529  ALBUMIN 2.7* 2.3* 2.3*   No results for input(s): LIPASE, AMYLASE in the last 168 hours. No results for  input(s): AMMONIA in the last 168 hours.  CBC: Recent Labs  Lab 05/29/18 0455 06/01/18 0455 06/03/18 0529  WBC 17.9* 16.0* 16.2*  HGB 10.3* 9.6* 9.9*  HCT 34.2* 32.5* 33.0*  MCV 93.7 95.3 93.5  PLT 554* 510* 476*    Cardiac Enzymes: No results for input(s): CKTOTAL, CKMB, CKMBINDEX, TROPONINI in the last 168 hours.  BNP (last 3 results) No results for input(s): BNP in the last 8760 hours.  ProBNP (last 3 results) No results for input(s): PROBNP in the last 8760 hours.  Radiological Exams: Dg Abd Portable 1v  Result Date: 06/04/2018 CLINICAL DATA:  37 year old female with impaired nasogastric feeding tube. EXAM: PORTABLE ABDOMEN - 1 VIEW COMPARISON:  05/31/2018 and earlier. FINDINGS: Portable AP supine view at 0709 hours. The patient is oblique to the left. NG tube remains in place but the side hole is located near the gastric fundus and cardia (arrow). Bowel gas pattern is nonobstructed and similar. Dense opacification of the left lung base. Partially visible dual lumen central line projecting over the right atrium. Levoconvex lumbar scoliosis. No acute osseous abnormality identified. IMPRESSION: 1. NG tube side hole at the level of proximal stomach. Advance 5 centimeters for mid gastric placement. 2. Stable non obstructed bowel gas pattern. 3. Opacified left lung base. Electronically Signed   By: Odessa Fleming M.D.   On: 06/04/2018 07:50  Assessment/Plan Principal Problem:   Acute on chronic respiratory failure with hypoxia (HCC) Active Problems:   Aspiration pneumonia (HCC)   Seizure disorder (HCC)   Cardiac arrest (HCC)   End stage renal disease on dialysis (HCC)   Chronic systolic heart failure (HCC)   1. Acute on chronic respiratory failure continue with T collar weaning she needs to have the NG tube taken care of I think after the last time we may be able to proceed to reduce decannulation 2. Pneumonia due to aspiration again she is n.p.o. she needs a PEG 3. Seizure  disorder no active seizures 4. Cardiac arrest stable 5. End-stage renal disease on hemodialysis 6. Chronic systolic heart failure at baseline continue to monitor   I have personally seen and evaluated the patient, evaluated laboratory and imaging results, formulated the assessment and plan and placed orders. The Patient requires high complexity decision making for assessment and support.  Case was discussed on Rounds with the Respiratory Therapy Staff  Yevonne Pax, MD [facility] Pulmonary Critical Care Medicine Sleep Medicine

## 2018-06-05 DIAGNOSIS — I5022 Chronic systolic (congestive) heart failure: Secondary | ICD-10-CM | POA: Diagnosis not present

## 2018-06-05 DIAGNOSIS — I469 Cardiac arrest, cause unspecified: Secondary | ICD-10-CM | POA: Diagnosis not present

## 2018-06-05 DIAGNOSIS — J9621 Acute and chronic respiratory failure with hypoxia: Secondary | ICD-10-CM | POA: Diagnosis not present

## 2018-06-05 DIAGNOSIS — J690 Pneumonitis due to inhalation of food and vomit: Secondary | ICD-10-CM | POA: Diagnosis not present

## 2018-06-05 LAB — RENAL FUNCTION PANEL
Albumin: 2.3 g/dL — ABNORMAL LOW (ref 3.5–5.0)
Anion gap: 10 (ref 5–15)
BUN: 19 mg/dL (ref 6–20)
CO2: 28 mmol/L (ref 22–32)
CREATININE: 1.62 mg/dL — AB (ref 0.44–1.00)
Calcium: 8.6 mg/dL — ABNORMAL LOW (ref 8.9–10.3)
Chloride: 97 mmol/L — ABNORMAL LOW (ref 98–111)
GFR calc Af Amer: 46 mL/min — ABNORMAL LOW (ref >60)
GFR, EST NON AFRICAN AMERICAN: 40 mL/min — AB (ref >60)
GLUCOSE: 119 mg/dL — AB (ref 70–99)
PHOSPHORUS: 1.6 mg/dL — AB (ref 2.5–4.6)
Potassium: 3.5 mmol/L (ref 3.5–5.1)
Sodium: 135 mmol/L (ref 135–145)

## 2018-06-05 LAB — CBC
HCT: 32.8 % — ABNORMAL LOW (ref 36.0–46.0)
Hemoglobin: 9.7 g/dL — ABNORMAL LOW (ref 12.0–15.0)
MCH: 27.6 pg (ref 26.0–34.0)
MCHC: 29.6 g/dL — AB (ref 30.0–36.0)
MCV: 93.2 fL (ref 78.0–100.0)
PLATELETS: 559 10*3/uL — AB (ref 150–400)
RBC: 3.52 MIL/uL — ABNORMAL LOW (ref 3.87–5.11)
RDW: 18 % — AB (ref 11.5–15.5)
WBC: 15.4 10*3/uL — AB (ref 4.0–10.5)

## 2018-06-05 NOTE — Progress Notes (Signed)
[facility]  ROUNDING NOTE   Subjective:  Patient seen at bedside. Seen and evaluated during hemodialysis. Blood flow rate 350 with ultrafiltration target of 2 kg.    Objective:  Vital signs in last 24 hours:  Temperature 97.9 pulse 76 respirations 20 blood pressure 137/84  Physical Exam: General: No acute distress  Head: [place]/AT NG in place  Eyes: Anicteric  Neck: Tracheostomy in place  Lungs:  Scattered rhonchi, vent assisted  Heart: S1S2 no rubs  Abdomen:  Soft, nontender, bowel sounds present  Extremities: trace peripheral edema.  Neurologic: Awake, follows commands  Skin: No lesions  Access: L IJ permcath    Basic Metabolic Panel: Recent Labs  Lab 06/01/18 0455 06/03/18 0529  NA 132* 128*  K 4.1 4.1  CL 95* 92*  CO2 26 25  GLUCOSE 108* 115*  BUN 62* 66*  CREATININE 3.87* 3.92*  CALCIUM 9.0 9.4  PHOS 3.9 3.6    Liver Function Tests: Recent Labs  Lab 06/01/18 0455 06/03/18 0529  ALBUMIN 2.3* 2.3*   No results for input(s): LIPASE, AMYLASE in the last 168 hours. No results for input(s): AMMONIA in the last 168 hours.  CBC: Recent Labs  Lab 06/01/18 0455 06/03/18 0529  WBC 16.0* 16.2*  HGB 9.6* 9.9*  HCT 32.5* 33.0*  MCV 95.3 93.5  PLT 510* 476*    Cardiac Enzymes: No results for input(s): CKTOTAL, CKMB, CKMBINDEX, TROPONINI in the last 168 hours.  BNP: Invalid input(s): POCBNP  CBG: No results for input(s): GLUCAP in the last 168 hours.  Microbiology: Results for orders placed or performed during the hospital encounter of 2018/06/02  Culture, respiratory (non-expectorated)     Status: None   Collection Time: 05/25/18  4:10 PM  Result Value Ref Range Status   Specimen Description TRACHEAL ASPIRATE  Final   Special Requests NONE  Final   Gram Stain   Final    RARE WBC PRESENT, PREDOMINANTLY PMN FEW GRAM POSITIVE COCCI Performed at [facility], [address] [place], [place] [address]    Culture   Final   MODERATE ESCHERICHIA COLI Confirmed Extended Spectrum Beta-Lactamase Producer (ESBL).  In bloodstream infections from ESBL organisms, carbapenems are preferred over piperacillin/tazobactam. They are shown to have a lower risk of mortality.    Report Status 05/28/2018 FINAL  Final   Organism ID, Bacteria ESCHERICHIA COLI  Final      Susceptibility   Escherichia coli - MIC*    AMPICILLIN >=32 RESISTANT Resistant     CEFAZOLIN >=64 RESISTANT Resistant     CEFEPIME RESISTANT Resistant     CEFTAZIDIME >=64 RESISTANT Resistant     CEFTRIAXONE >=64 RESISTANT Resistant     CIPROFLOXACIN >=4 RESISTANT Resistant     GENTAMICIN <=1 SENSITIVE Sensitive     IMIPENEM <=0.25 SENSITIVE Sensitive     TRIMETH/SULFA <=20 SENSITIVE Sensitive     AMPICILLIN/SULBACTAM >=32 RESISTANT Resistant     PIP/TAZO >=128 RESISTANT Resistant     Extended ESBL POSITIVE Resistant     * MODERATE ESCHERICHIA COLI  C difficile quick scan w PCR reflex     Status: Abnormal   Collection Time: 05/25/18  4:27 PM  Result Value Ref Range Status   C Diff antigen POSITIVE (A) NEGATIVE Final   C Diff toxin NEGATIVE NEGATIVE Final   C Diff interpretation Results are indeterminate. See PCR results.  Final    Comment: Performed at [facility], [address] [place], [place] [address]  C.  Diff by PCR, Reflexed     Status: Abnormal   Collection Time: 05/25/18  4:27 PM  Result Value Ref Range Status   Toxigenic C. Difficile by PCR POSITIVE (A) NEGATIVE Final    Comment: Positive for toxigenic C. difficile with little to no toxin production. Only treat if clinical presentation suggests symptomatic illness. Performed at [facility], [address] [place], [place] [address]   Culture, blood (routine x 2)     Status: None   Collection Time: 05/25/18  8:20 PM  Result Value Ref Range Status   Specimen Description BLOOD LEFT WRIST  Final   Special Requests   Final    BOTTLES DRAWN AEROBIC ONLY Blood Culture results may  not be optimal due to an inadequate volume of blood received in culture bottles   Culture   Final    NO GROWTH 5 DAYS Performed at [facility], [address], [place], [place] [address]    Report Status 05/30/2018 FINAL  Final  Culture, blood (routine x 2)     Status: None   Collection Time: 05/25/18  8:28 PM  Result Value Ref Range Status   Specimen Description BLOOD LEFT HAND  Final   Special Requests   Final    BOTTLES DRAWN AEROBIC ONLY Blood Culture results may not be optimal due to an inadequate volume of blood received in culture bottles   Culture   Final    NO GROWTH 5 DAYS Performed at [facility], [address] [place], [place] [address]    Report Status 05/30/2018 FINAL  Final    Coagulation Studies: No results for input(s): LABPROT, INR in the last 72 hours.  Urinalysis: No results for input(s): COLORURINE, LABSPEC, PHURINE, GLUCOSEU, HGBUR, BILIRUBINUR, KETONESUR, PROTEINUR, UROBILINOGEN, NITRITE, LEUKOCYTESUR in the last 72 hours.  Invalid input(s): APPERANCEUR    Imaging: Dg Abd Portable 1v  Result Date: 06/04/2018 CLINICAL DATA:  NG tube placement. EXAM: PORTABLE ABDOMEN - 1 VIEW COMPARISON:  June 04, 2018 FINDINGS: NG tube is identified with distal tip probably in the mid stomach. Complete opacity of the visualized left hemithorax is noted. IMPRESSION: Nasogastric tube with distal tip probably in the mid stomach. Electronically Signed   By: Sherian Rein M.D.   On: 06/04/2018 14:08   Dg Abd Portable 1v  Result Date: 06/04/2018 CLINICAL DATA:  37 year old female with impaired nasogastric feeding tube. EXAM: PORTABLE ABDOMEN - 1 VIEW COMPARISON:  05/31/2018 and earlier. FINDINGS: Portable AP supine view at 0709 hours. The patient is oblique to the left. NG tube remains in place but the side hole is located near the gastric fundus and cardia (arrow). Bowel gas pattern is nonobstructed and similar. Dense opacification of the left lung base. Partially  visible dual lumen central line projecting over the right atrium. Levoconvex lumbar scoliosis. No acute osseous abnormality identified. IMPRESSION: 1. NG tube side hole at the level of proximal stomach. Advance 5 centimeters for mid gastric placement. 2. Stable non obstructed bowel gas pattern. 3. Opacified left lung base. Electronically Signed   By: Odessa Fleming M.D.   On: 06/04/2018 07:50     Medications:       Assessment/ Plan:  37 y.o. female with a PMHx of recent acute respiratory failure requiring intubation, aspiration pneumonia, cardiac arrest, chronic systolic heart failure, delay of cognitive development, end-stage renal disease on hemodialysis MWF, hypertension, seizure disorder, who was admitted to [facility] on 04/30/2018 for ongoing management of acute respiratory  failure, recent PEA arrest, and end-stage renal disease.   1.  ESRD on HD.    Patient seen during dialysis today.  Ultrafiltration target 2 kg.  Blood flow rate 350.  2.  Acute respiratory failure.  Secondary to aspiration.   -Continue ventilatory support at this time.  3.  Anemia of chronic kidney disease.  Hemoglobin stable at 9.9.  Continue to monitor.  4.  Secondary hyperparathyroidism.  Repeat serum phosphorus today.  5.  Hypertension:     Most recent blood pressure indicated good control at 137/84.  Continue current doses of amlodipine, clonidine, hydralazine, lisinopril, and metoprolol.     LOS: 0 Astaria Nanez 9/6/20198:41 AM

## 2018-06-05 NOTE — Progress Notes (Signed)
Pulmonary Critical Care Medicine [facility] [place]   PULMONARY CRITICAL CARE SERVICE  PROGRESS NOTE  Date of Service: 06/05/2018  Lori Gutierrez  VXB:[id number]  DOB: 12/23/1980   DOA: May 25, 2018  Referring Physician: Carron Curie, MD  HPI: Lori Gutierrez is a 37 y.o. female seen for follow up of Acute on Chronic Respiratory Failure.  Patient is on full support right now she will be weaned back on T collar after dialysis  Medications: Reviewed on Rounds  Physical Exam:  Vitals: Temperature 97.7 pulse 76 respiratory rate 20 blood pressure 131/84 saturation 96%  Ventilator Settings currently on assist control FiO2 50% tidal volume 500 PEEP 5  . General: Comfortable at this time . Eyes: Grossly normal lids, irises & conjunctiva . ENT: grossly tongue is normal . Neck: no obvious mass . Cardiovascular: S1 S2 normal no gallop . Respiratory: Coarse breath sounds no rhonchi . Abdomen: soft . Skin: no rash seen on limited exam . Musculoskeletal: not rigid . Psychiatric:unable to assess . Neurologic: no seizure no involuntary movements         Lab Data:   Basic Metabolic Panel: Recent Labs  Lab 06/01/18 0455 06/03/18 0529 06/05/18 1618  NA 132* 128* 135  K 4.1 4.1 3.5  CL 95* 92* 97*  CO2 26 25 28   GLUCOSE 108* 115* 119*  BUN 62* 66* 19  CREATININE 3.87* 3.92* 1.62*  CALCIUM 9.0 9.4 8.6*  PHOS 3.9 3.6 1.6*    ABG: Recent Labs  Lab 05/31/18 1415 05/31/18 1539 06/01/18 0415  PHART 7.299* 7.298* 7.350  PCO2ART 55.4* 54.9* 46.5  PO2ART 64.6* 83.2 98.6  HCO3 26.4 26.1 25.4  O2SAT 91.8 96.2 98.2    Liver Function Tests: Recent Labs  Lab 06/01/18 0455 06/03/18 0529 06/05/18 1618  ALBUMIN 2.3* 2.3* 2.3*   No results for input(s): LIPASE, AMYLASE in the last 168 hours. No results for input(s): AMMONIA in the last 168 hours.  CBC: Recent Labs  Lab 06/01/18 0455 06/03/18 0529 06/05/18 1618  WBC 16.0* 16.2* 15.4*  HGB 9.6* 9.9* 9.7*  HCT 32.5*  33.0* 32.8*  MCV 95.3 93.5 93.2  PLT 510* 476* 559*    Cardiac Enzymes: No results for input(s): CKTOTAL, CKMB, CKMBINDEX, TROPONINI in the last 168 hours.  BNP (last 3 results) No results for input(s): BNP in the last 8760 hours.  ProBNP (last 3 results) No results for input(s): PROBNP in the last 8760 hours.  Radiological Exams: Dg Abd Portable 1v  Result Date: 06/04/2018 CLINICAL DATA:  NG tube placement. EXAM: PORTABLE ABDOMEN - 1 VIEW COMPARISON:  June 04, 2018 FINDINGS: NG tube is identified with distal tip probably in the mid stomach. Complete opacity of the visualized left hemithorax is noted. IMPRESSION: Nasogastric tube with distal tip probably in the mid stomach. Electronically Signed   By: Sherian Rein M.D.   On: 06/04/2018 14:08   Dg Abd Portable 1v  Result Date: 06/04/2018 CLINICAL DATA:  37 year old female with impaired nasogastric feeding tube. EXAM: PORTABLE ABDOMEN - 1 VIEW COMPARISON:  05/31/2018 and earlier. FINDINGS: Portable AP supine view at 0709 hours. The patient is oblique to the left. NG tube remains in place but the side hole is located near the gastric fundus and cardia (arrow). Bowel gas pattern is nonobstructed and similar. Dense opacification of the left lung base. Partially visible dual lumen central line projecting over the right atrium. Levoconvex lumbar scoliosis. No acute osseous abnormality identified. IMPRESSION: 1. NG tube side hole at the  level of proximal stomach. Advance 5 centimeters for mid gastric placement. 2. Stable non obstructed bowel gas pattern. 3. Opacified left lung base. Electronically Signed   By: Odessa Fleming M.D.   On: 06/04/2018 07:50    Assessment/Plan Principal Problem:   Acute on chronic respiratory failure with hypoxia (HCC) Active Problems:   Aspiration pneumonia (HCC)   Seizure disorder (HCC)   Cardiac arrest (HCC)   End stage renal disease on dialysis (HCC)   Chronic systolic heart failure (HCC)   1. Acute on chronic  respiratory failure with hypoxia continue with weaning process again as mentioned back on T collar as tolerated continue with supportive care 2. Pneumonia due to aspiration stable treated we will follow 3. Seizure disorder at baseline no active seizures 4. Cardiac arrest rhythm is been stable 5. End-stage renal disease on hemodialysis 6. Chronic systolic heart failure right now appears to be compensated   I have personally seen and evaluated the patient, evaluated laboratory and imaging results, formulated the assessment and plan and placed orders. The Patient requires high complexity decision making for assessment and support.  Case was discussed on Rounds with the Respiratory Therapy Staff  Yevonne Pax, MD [facility] Pulmonary Critical Care Medicine Sleep Medicine

## 2018-06-06 DIAGNOSIS — I5022 Chronic systolic (congestive) heart failure: Secondary | ICD-10-CM | POA: Diagnosis not present

## 2018-06-06 DIAGNOSIS — J690 Pneumonitis due to inhalation of food and vomit: Secondary | ICD-10-CM | POA: Diagnosis not present

## 2018-06-06 DIAGNOSIS — I469 Cardiac arrest, cause unspecified: Secondary | ICD-10-CM | POA: Diagnosis not present

## 2018-06-06 DIAGNOSIS — J9621 Acute and chronic respiratory failure with hypoxia: Secondary | ICD-10-CM | POA: Diagnosis not present

## 2018-06-06 NOTE — Progress Notes (Signed)
Pulmonary Critical Care Medicine [facility] [place]   PULMONARY CRITICAL CARE SERVICE  PROGRESS NOTE  Date of Service: 06/06/2018  Lori Gutierrez  WNI:[id number]  DOB: Aug 13, 1981   DOA: 05/30/2018  Referring Physician: Carron Curie, MD  HPI: Lori Gutierrez is a 37 y.o. female seen for follow up of Acute on Chronic Respiratory Failure.  Patient is on T collar out of the bed she is doing fairly well this morning  Medications: Reviewed on Rounds  Physical Exam:  Vitals: Temperature 97.0 pulse 75 respiratory rate 23 blood pressure 108/61 saturation 96%  Ventilator Settings currently off the ventilator on T collar  . General: Comfortable at this time . Eyes: Grossly normal lids, irises & conjunctiva . ENT: grossly tongue is normal . Neck: no obvious mass . Cardiovascular: S1 S2 normal no gallop . Respiratory: No rhonchi no rales . Abdomen: soft . Skin: no rash seen on limited exam . Musculoskeletal: not rigid . Psychiatric:unable to assess . Neurologic: no seizure no involuntary movements         Lab Data:   Basic Metabolic Panel: Recent Labs  Lab 06/01/18 0455 06/03/18 0529 06/05/18 1618  NA 132* 128* 135  K 4.1 4.1 3.5  CL 95* 92* 97*  CO2 26 25 28   GLUCOSE 108* 115* 119*  BUN 62* 66* 19  CREATININE 3.87* 3.92* 1.62*  CALCIUM 9.0 9.4 8.6*  PHOS 3.9 3.6 1.6*    ABG: Recent Labs  Lab 05/31/18 1415 05/31/18 1539 06/01/18 0415  PHART 7.299* 7.298* 7.350  PCO2ART 55.4* 54.9* 46.5  PO2ART 64.6* 83.2 98.6  HCO3 26.4 26.1 25.4  O2SAT 91.8 96.2 98.2    Liver Function Tests: Recent Labs  Lab 06/01/18 0455 06/03/18 0529 06/05/18 1618  ALBUMIN 2.3* 2.3* 2.3*   No results for input(s): LIPASE, AMYLASE in the last 168 hours. No results for input(s): AMMONIA in the last 168 hours.  CBC: Recent Labs  Lab 06/01/18 0455 06/03/18 0529 06/05/18 1618  WBC 16.0* 16.2* 15.4*  HGB 9.6* 9.9* 9.7*  HCT 32.5* 33.0* 32.8*  MCV 95.3 93.5 93.2  PLT 510*  476* 559*    Cardiac Enzymes: No results for input(s): CKTOTAL, CKMB, CKMBINDEX, TROPONINI in the last 168 hours.  BNP (last 3 results) No results for input(s): BNP in the last 8760 hours.  ProBNP (last 3 results) No results for input(s): PROBNP in the last 8760 hours.  Radiological Exams: No results found.  Assessment/Plan Principal Problem:   Acute on chronic respiratory failure with hypoxia (HCC) Active Problems:   Aspiration pneumonia (HCC)   Seizure disorder (HCC)   Cardiac arrest (HCC)   End stage renal disease on dialysis (HCC)   Chronic systolic heart failure (HCC)   1. Acute on chronic respiratory failure with hypoxia we will continue with T collar trials titrate oxygen as tolerated continue pulmonary toilet supportive care. 2. Pneumonia due to aspiration continue present management. 3. Seizure disorder we will continue supportive care and follow along 4. Cardiac arrest at baseline 5. End-stage renal disease on dialysis followed by nephrology 6. Chronic systolic heart failure monitor fluid status   I have personally seen and evaluated the patient, evaluated laboratory and imaging results, formulated the assessment and plan and placed orders. The Patient requires high complexity decision making for assessment and support.  Case was discussed on Rounds with the Respiratory Therapy Staff  Yevonne Pax, MD [facility] Pulmonary Critical Care Medicine Sleep Medicine

## 2018-06-07 DIAGNOSIS — I469 Cardiac arrest, cause unspecified: Secondary | ICD-10-CM | POA: Diagnosis not present

## 2018-06-07 DIAGNOSIS — J690 Pneumonitis due to inhalation of food and vomit: Secondary | ICD-10-CM | POA: Diagnosis not present

## 2018-06-07 DIAGNOSIS — J9621 Acute and chronic respiratory failure with hypoxia: Secondary | ICD-10-CM | POA: Diagnosis not present

## 2018-06-07 DIAGNOSIS — I5022 Chronic systolic (congestive) heart failure: Secondary | ICD-10-CM | POA: Diagnosis not present

## 2018-06-07 NOTE — Progress Notes (Signed)
Pulmonary Critical Care Medicine [facility] [place]   PULMONARY CRITICAL CARE SERVICE  PROGRESS NOTE  Date of Service: 06/07/2018  Lori Gutierrez  HWE:[id number]  DOB: 05-Apr-1981   DOA: May 26, 2018  Referring Physician: Carron Curie, MD  HPI: Lori Gutierrez is a 37 y.o. female seen for follow up of Acute on Chronic Respiratory Failure.  She is back on the ventilator.  Patient's and assist control mode has been on 50% oxygen.  Secretions have been moderate  Medications: Reviewed on Rounds  Physical Exam:  Vitals: Temperature 98.6 pulse 71 respiratory rate 15 blood pressure one 5/65 saturation 98%  Ventilator Settings mode of ventilation assist control FiO2 50% tidal volume 390.5  . General: Comfortable at this time . Eyes: Grossly normal lids, irises & conjunctiva . ENT: grossly tongue is normal . Neck: no obvious mass . Cardiovascular: S1 S2 normal no gallop . Respiratory: Few scattered distant rhonchi . Abdomen: soft . Skin: no rash seen on limited exam . Musculoskeletal: not rigid . Psychiatric:unable to assess . Neurologic: no seizure no involuntary movements         Lab Data:   Basic Metabolic Panel: Recent Labs  Lab 06/01/18 0455 06/03/18 0529 06/05/18 1618  NA 132* 128* 135  K 4.1 4.1 3.5  CL 95* 92* 97*  CO2 26 25 28   GLUCOSE 108* 115* 119*  BUN 62* 66* 19  CREATININE 3.87* 3.92* 1.62*  CALCIUM 9.0 9.4 8.6*  PHOS 3.9 3.6 1.6*    ABG: Recent Labs  Lab 05/31/18 1539 06/01/18 0415  PHART 7.298* 7.350  PCO2ART 54.9* 46.5  PO2ART 83.2 98.6  HCO3 26.1 25.4  O2SAT 96.2 98.2    Liver Function Tests: Recent Labs  Lab 06/01/18 0455 06/03/18 0529 06/05/18 1618  ALBUMIN 2.3* 2.3* 2.3*   No results for input(s): LIPASE, AMYLASE in the last 168 hours. No results for input(s): AMMONIA in the last 168 hours.  CBC: Recent Labs  Lab 06/01/18 0455 06/03/18 0529 06/05/18 1618  WBC 16.0* 16.2* 15.4*  HGB 9.6* 9.9* 9.7*  HCT 32.5* 33.0*  32.8*  MCV 95.3 93.5 93.2  PLT 510* 476* 559*    Cardiac Enzymes: No results for input(s): CKTOTAL, CKMB, CKMBINDEX, TROPONINI in the last 168 hours.  BNP (last 3 results) No results for input(s): BNP in the last 8760 hours.  ProBNP (last 3 results) No results for input(s): PROBNP in the last 8760 hours.  Radiological Exams: No results found.  Assessment/Plan Principal Problem:   Acute on chronic respiratory failure with hypoxia (HCC) Active Problems:   Aspiration pneumonia (HCC)   Seizure disorder (HCC)   Cardiac arrest (HCC)   End stage renal disease on dialysis (HCC)   Chronic systolic heart failure (HCC)   1. Acute on chronic respiratory failure with hypoxia continue with full vent support right now we will discuss with respiratory therapy regarding resuming wean.  Continue aggressive pulmonary toilet supportive care. 2. Pneumonia due to aspiration treated with antibiotics we will continue to follow-up in on the x-rays. 3. Seizure disorder no active seizures 4. Cardiac arrest stable rhythm at this time. 5. End-stage renal disease on hemodialysis we will follow 6. Chronic systolic heart failure compensated follow-up x-rays   I have personally seen and evaluated the patient, evaluated laboratory and imaging results, formulated the assessment and plan and placed orders. The Patient requires high complexity decision making for assessment and support.  Case was discussed on Rounds with the Respiratory Therapy Staff  Yevonne Pax,  MD [facility] Pulmonary Critical Care Medicine Sleep Medicine

## 2018-06-08 DIAGNOSIS — J690 Pneumonitis due to inhalation of food and vomit: Secondary | ICD-10-CM | POA: Diagnosis not present

## 2018-06-08 DIAGNOSIS — J9621 Acute and chronic respiratory failure with hypoxia: Secondary | ICD-10-CM | POA: Diagnosis not present

## 2018-06-08 DIAGNOSIS — I469 Cardiac arrest, cause unspecified: Secondary | ICD-10-CM | POA: Diagnosis not present

## 2018-06-08 DIAGNOSIS — I5022 Chronic systolic (congestive) heart failure: Secondary | ICD-10-CM | POA: Diagnosis not present

## 2018-06-08 LAB — CBC
HEMATOCRIT: 31.3 % — AB (ref 36.0–46.0)
Hemoglobin: 9.2 g/dL — ABNORMAL LOW (ref 12.0–15.0)
MCH: 27.5 pg (ref 26.0–34.0)
MCHC: 29.4 g/dL — ABNORMAL LOW (ref 30.0–36.0)
MCV: 93.4 fL (ref 78.0–100.0)
Platelets: 499 10*3/uL — ABNORMAL HIGH (ref 150–400)
RBC: 3.35 MIL/uL — AB (ref 3.87–5.11)
RDW: 18 % — ABNORMAL HIGH (ref 11.5–15.5)
WBC: 17.8 10*3/uL — AB (ref 4.0–10.5)

## 2018-06-08 LAB — RENAL FUNCTION PANEL
Albumin: 2.3 g/dL — ABNORMAL LOW (ref 3.5–5.0)
Anion gap: 12 (ref 5–15)
BUN: 71 mg/dL — ABNORMAL HIGH (ref 6–20)
CHLORIDE: 92 mmol/L — AB (ref 98–111)
CO2: 27 mmol/L (ref 22–32)
CREATININE: 3.79 mg/dL — AB (ref 0.44–1.00)
Calcium: 9 mg/dL (ref 8.9–10.3)
GFR, EST AFRICAN AMERICAN: 17 mL/min — AB (ref >60)
GFR, EST NON AFRICAN AMERICAN: 14 mL/min — AB (ref >60)
Glucose, Bld: 96 mg/dL (ref 70–99)
POTASSIUM: 4.7 mmol/L (ref 3.5–5.1)
Phosphorus: 4.6 mg/dL (ref 2.5–4.6)
Sodium: 131 mmol/L — ABNORMAL LOW (ref 135–145)

## 2018-06-08 NOTE — Progress Notes (Signed)
[facility]  ROUNDING NOTE   Subjective:  Patient seen and evaluated during hemodialysis. Tolerating well.   Objective:  Vital signs in last 24 hours:  Temperature 99.1 pulse 88 respirations 21 blood pressure 117/73  Physical Exam: General: No acute distress  Head: [place]/AT NG in place  Eyes: Anicteric  Neck: Tracheostomy in place  Lungs:  Scattered rhonchi, vent assisted  Heart: S1S2 no rubs  Abdomen:  Soft, nontender, bowel sounds present  Extremities: trace peripheral edema.  Neurologic: Awake, follows commands  Skin: No lesions  Access: L IJ permcath    Basic Metabolic Panel: Recent Labs  Lab 06/03/18 0529 06/05/18 1618 06/08/18 0703  NA 128* 135 131*  K 4.1 3.5 4.7  CL 92* 97* 92*  CO2 25 28 27   GLUCOSE 115* 119* 96  BUN 66* 19 71*  CREATININE 3.92* 1.62* 3.79*  CALCIUM 9.4 8.6* 9.0  PHOS 3.6 1.6* 4.6    Liver Function Tests: Recent Labs  Lab 06/03/18 0529 06/05/18 1618 06/08/18 0703  ALBUMIN 2.3* 2.3* 2.3*   No results for input(s): LIPASE, AMYLASE in the last 168 hours. No results for input(s): AMMONIA in the last 168 hours.  CBC: Recent Labs  Lab 06/03/18 0529 06/05/18 1618 06/08/18 0703  WBC 16.2* 15.4* 17.8*  HGB 9.9* 9.7* 9.2*  HCT 33.0* 32.8* 31.3*  MCV 93.5 93.2 93.4  PLT 476* 559* 499*    Cardiac Enzymes: No results for input(s): CKTOTAL, CKMB, CKMBINDEX, TROPONINI in the last 168 hours.  BNP: Invalid input(s): POCBNP  CBG: No results for input(s): GLUCAP in the last 168 hours.  Microbiology: Results for orders placed or performed during the hospital encounter of 05/24/2018  Culture, respiratory (non-expectorated)     Status: None   Collection Time: 05/25/18  4:10 PM  Result Value Ref Range Status   Specimen Description TRACHEAL ASPIRATE  Final   Special Requests NONE  Final   Gram Stain   Final    RARE WBC PRESENT, PREDOMINANTLY PMN FEW GRAM POSITIVE COCCI Performed at [facility], [address], [place], [place] [address]    Culture   Final    MODERATE ESCHERICHIA COLI Confirmed Extended Spectrum Beta-Lactamase Producer (ESBL).  In bloodstream infections from ESBL organisms, carbapenems are preferred over piperacillin/tazobactam. They are shown to have a lower risk of mortality.    Report Status 05/28/2018 FINAL  Final   Organism ID, Bacteria ESCHERICHIA COLI  Final      Susceptibility   Escherichia coli - MIC*    AMPICILLIN >=32 RESISTANT Resistant     CEFAZOLIN >=64 RESISTANT Resistant     CEFEPIME RESISTANT Resistant     CEFTAZIDIME >=64 RESISTANT Resistant     CEFTRIAXONE >=64 RESISTANT Resistant     CIPROFLOXACIN >=4 RESISTANT Resistant     GENTAMICIN <=1 SENSITIVE Sensitive     IMIPENEM <=0.25 SENSITIVE Sensitive     TRIMETH/SULFA <=20 SENSITIVE Sensitive     AMPICILLIN/SULBACTAM >=32 RESISTANT Resistant     PIP/TAZO >=128 RESISTANT Resistant     Extended ESBL POSITIVE Resistant     * MODERATE ESCHERICHIA COLI  C difficile quick scan w PCR reflex     Status: Abnormal   Collection Time: 05/25/18  4:27 PM  Result Value Ref Range Status   C Diff antigen POSITIVE (A) NEGATIVE Final   C Diff toxin NEGATIVE NEGATIVE Final   C Diff interpretation Results are indeterminate. See PCR results.  Final    Comment: Performed at [facility], [address] [place], [place] [address]  C. Diff by PCR, Reflexed     Status: Abnormal   Collection Time: 05/25/18  4:27 PM  Result Value Ref Range Status   Toxigenic C. Difficile by PCR POSITIVE (A) NEGATIVE Final    Comment: Positive for toxigenic C. difficile with little to no toxin production. Only treat if clinical presentation suggests symptomatic illness. Performed at [facility], [address] [place], [place] [address]   Culture, blood (routine x 2)     Status: None   Collection Time: 05/25/18  8:20 PM  Result Value Ref Range Status   Specimen Description BLOOD LEFT WRIST  Final   Special Requests   Final     BOTTLES DRAWN AEROBIC ONLY Blood Culture results may not be optimal due to an inadequate volume of blood received in culture bottles   Culture   Final    NO GROWTH 5 DAYS Performed at [facility], [address], [place], [place] [address]    Report Status 05/30/2018 FINAL  Final  Culture, blood (routine x 2)     Status: None   Collection Time: 05/25/18  8:28 PM  Result Value Ref Range Status   Specimen Description BLOOD LEFT HAND  Final   Special Requests   Final    BOTTLES DRAWN AEROBIC ONLY Blood Culture results may not be optimal due to an inadequate volume of blood received in culture bottles   Culture   Final    NO GROWTH 5 DAYS Performed at [facility], [address] [place], [place] [address]    Report Status 05/30/2018 FINAL  Final    Coagulation Studies: No results for input(s): LABPROT, INR in the last 72 hours.  Urinalysis: No results for input(s): COLORURINE, LABSPEC, PHURINE, GLUCOSEU, HGBUR, BILIRUBINUR, KETONESUR, PROTEINUR, UROBILINOGEN, NITRITE, LEUKOCYTESUR in the last 72 hours.  Invalid input(s): APPERANCEUR    Imaging: No results found.   Medications:       Assessment/ Plan:  37 y.o. female with a PMHx of recent acute respiratory failure requiring intubation, aspiration pneumonia, cardiac arrest, chronic systolic heart failure, delay of cognitive development, end-stage renal disease on hemodialysis MWF, hypertension, seizure disorder, who was admitted to [facility] on 04/30/2018 for ongoing management of acute respiratory failure, recent PEA arrest, and end-stage renal disease.   1.  ESRD on HD.    Patient seen and evaluated during hemodialysis and tolerating well.  Next dialysis treatment on Wednesday.  2.  Acute respiratory failure.  Secondary to aspiration.   -Patient has been maintained on ventilatory support.  Weaning as per pulmonary/critical care.  3.  Anemia of chronic kidney disease.  Hemoglobin was 9.2 and likely  hemodilution over the weekend.  Repeat CBC on Wednesday.  4.  Secondary hyperparathyroidism.  Phosphorus 4.6 and at target.  5.  Hypertension:   Continue current doses of amlodipine, clonidine, hydralazine, lisinopril, and metoprolol.  Blood pressure under good control at 117/73.   LOS: 0 Addylin Manke 9/9/20191:17 PM

## 2018-06-08 NOTE — Progress Notes (Signed)
Pulmonary Critical Care Medicine [facility] [place]   PULMONARY CRITICAL CARE SERVICE  PROGRESS NOTE  Date of Service: 06/08/2018  Lori Gutierrez  JEH:[id number]  DOB: 07/26/1981   DOA: 05/10/2018  Referring Physician: Carron Curie, MD  HPI: Lori Gutierrez is a 37 y.o. female seen for follow up of Acute on Chronic Respiratory Failure.  Continue with full vent support.  Patient has not been able to wean today  Medications: Reviewed on Rounds  Physical Exam:  Vitals: Temperature 97.1 pulse 88 respiratory rate 21 blood pressure 117/73 saturations 96%  Ventilator Settings currently on assist control FiO2 40% tidal volume 380 PEEP 5  . General: Comfortable at this time . Eyes: Grossly normal lids, irises & conjunctiva . ENT: grossly tongue is normal . Neck: no obvious mass . Cardiovascular: S1 S2 normal no gallop . Respiratory: Coarse rhonchi noted bilaterally . Abdomen: soft . Skin: no rash seen on limited exam . Musculoskeletal: not rigid . Psychiatric:unable to assess . Neurologic: no seizure no involuntary movements         Lab Data:   Basic Metabolic Panel: Recent Labs  Lab 06/03/18 0529 06/05/18 1618 06/08/18 0703  NA 128* 135 131*  K 4.1 3.5 4.7  CL 92* 97* 92*  CO2 25 28 27   GLUCOSE 115* 119* 96  BUN 66* 19 71*  CREATININE 3.92* 1.62* 3.79*  CALCIUM 9.4 8.6* 9.0  PHOS 3.6 1.6* 4.6    ABG: No results for input(s): PHART, PCO2ART, PO2ART, HCO3, O2SAT in the last 168 hours.  Liver Function Tests: Recent Labs  Lab 06/03/18 0529 06/05/18 1618 06/08/18 0703  ALBUMIN 2.3* 2.3* 2.3*   No results for input(s): LIPASE, AMYLASE in the last 168 hours. No results for input(s): AMMONIA in the last 168 hours.  CBC: Recent Labs  Lab 06/03/18 0529 06/05/18 1618 06/08/18 0703  WBC 16.2* 15.4* 17.8*  HGB 9.9* 9.7* 9.2*  HCT 33.0* 32.8* 31.3*  MCV 93.5 93.2 93.4  PLT 476* 559* 499*    Cardiac Enzymes: No results for input(s): CKTOTAL, CKMB,  CKMBINDEX, TROPONINI in the last 168 hours.  BNP (last 3 results) No results for input(s): BNP in the last 8760 hours.  ProBNP (last 3 results) No results for input(s): PROBNP in the last 8760 hours.  Radiological Exams: No results found.  Assessment/Plan Principal Problem:   Acute on chronic respiratory failure with hypoxia (HCC) Active Problems:   Aspiration pneumonia (HCC)   Seizure disorder (HCC)   Cardiac arrest (HCC)   End stage renal disease on dialysis (HCC)   Chronic systolic heart failure (HCC)   1. Acute on chronic respiratory failure with hypoxia she has some worsening of her oxygenation and also has increased work of breathing was placed back on the ventilator at this time.  Will reassess her spontaneous trial in the morning.  In the meantime patient will be dialyzed 2. Pneumonia due to aspiration treated we will continue with supportive care 3. Seizure disorder no active seizures are noted. 4. Cardiac arrest rhythm is been stable 5. End-stage renal disease on hemodialysis which will be continued 6. Chronic systolic heart failure we will continue with present management.   I have personally seen and evaluated the patient, evaluated laboratory and imaging results, formulated the assessment and plan and placed orders. The Patient requires high complexity decision making for assessment and support.  Case was discussed on Rounds with the Respiratory Therapy Staff  Yevonne Pax, MD [facility] Pulmonary Critical Care Medicine Sleep  Medicine

## 2018-06-09 DIAGNOSIS — J690 Pneumonitis due to inhalation of food and vomit: Secondary | ICD-10-CM | POA: Diagnosis not present

## 2018-06-09 DIAGNOSIS — I469 Cardiac arrest, cause unspecified: Secondary | ICD-10-CM | POA: Diagnosis not present

## 2018-06-09 DIAGNOSIS — I5022 Chronic systolic (congestive) heart failure: Secondary | ICD-10-CM | POA: Diagnosis not present

## 2018-06-09 DIAGNOSIS — J9621 Acute and chronic respiratory failure with hypoxia: Secondary | ICD-10-CM | POA: Diagnosis not present

## 2018-06-09 NOTE — Progress Notes (Signed)
Pulmonary Critical Care Medicine [facility] [place]   PULMONARY CRITICAL CARE SERVICE  PROGRESS NOTE  Date of Service: 06/09/2018  Lori Gutierrez  MWU:[id number]  DOB: July 17, 1981   DOA: 05/25/2018  Referring Physician: Carron Curie, MD  HPI: Lori Gutierrez is a 37 y.o. female seen for follow up of Acute on Chronic Respiratory Failure.  Patient Raynaud's on T collar has been on 50% FiO2 has been tolerating it fairly well.  Secretions are moderate at this time saturations are good  Medications: Reviewed on Rounds  Physical Exam:  Vitals: Temperature 98.0 pulse 91 respiratory rate 15 blood pressure 119/74 saturations 94%  Ventilator Settings off the ventilator currently on T collar 50% FiO2  . General: Comfortable at this time . Eyes: Grossly normal lids, irises & conjunctiva . ENT: grossly tongue is normal . Neck: no obvious mass . Cardiovascular: S1 S2 normal no gallop . Respiratory: Coarse breath sounds no rhonchi noted . Abdomen: soft . Skin: no rash seen on limited exam . Musculoskeletal: not rigid . Psychiatric:unable to assess . Neurologic: no seizure no involuntary movements         Lab Data:   Basic Metabolic Panel: Recent Labs  Lab 06/03/18 0529 06/05/18 1618 06/08/18 0703  NA 128* 135 131*  K 4.1 3.5 4.7  CL 92* 97* 92*  CO2 25 28 27   GLUCOSE 115* 119* 96  BUN 66* 19 71*  CREATININE 3.92* 1.62* 3.79*  CALCIUM 9.4 8.6* 9.0  PHOS 3.6 1.6* 4.6    ABG: No results for input(s): PHART, PCO2ART, PO2ART, HCO3, O2SAT in the last 168 hours.  Liver Function Tests: Recent Labs  Lab 06/03/18 0529 06/05/18 1618 06/08/18 0703  ALBUMIN 2.3* 2.3* 2.3*   No results for input(s): LIPASE, AMYLASE in the last 168 hours. No results for input(s): AMMONIA in the last 168 hours.  CBC: Recent Labs  Lab 06/03/18 0529 06/05/18 1618 06/08/18 0703  WBC 16.2* 15.4* 17.8*  HGB 9.9* 9.7* 9.2*  HCT 33.0* 32.8* 31.3*  MCV 93.5 93.2 93.4  PLT 476* 559* 499*     Cardiac Enzymes: No results for input(s): CKTOTAL, CKMB, CKMBINDEX, TROPONINI in the last 168 hours.  BNP (last 3 results) No results for input(s): BNP in the last 8760 hours.  ProBNP (last 3 results) No results for input(s): PROBNP in the last 8760 hours.  Radiological Exams: No results found.  Assessment/Plan Principal Problem:   Acute on chronic respiratory failure with hypoxia (HCC) Active Problems:   Aspiration pneumonia (HCC)   Seizure disorder (HCC)   Cardiac arrest (HCC)   End stage renal disease on dialysis (HCC)   Chronic systolic heart failure (HCC)   1. Acute on chronic respiratory failure with hypoxia patient is doing fairly well on 50%.  Ultimately it has been decided that the patient will likely become comfort care we are waiting for that decision. 2. Pneumonia due to aspiration at baseline we will continue supportive care 3. Seizure disorder no active seizures 4. Cardiac arrest stable we will monitor 5. End-stage renal disease on dialysis 6. Chronic systolic heart failure compensated   I have personally seen and evaluated the patient, evaluated laboratory and imaging results, formulated the assessment and plan and placed orders. The Patient requires high complexity decision making for assessment and support.  Case was discussed on Rounds with the Respiratory Therapy Staff  Yevonne Pax, MD [facility] Pulmonary Critical Care Medicine Sleep Medicine

## 2018-06-10 DIAGNOSIS — J9621 Acute and chronic respiratory failure with hypoxia: Secondary | ICD-10-CM | POA: Diagnosis not present

## 2018-06-10 DIAGNOSIS — I5022 Chronic systolic (congestive) heart failure: Secondary | ICD-10-CM | POA: Diagnosis not present

## 2018-06-10 DIAGNOSIS — J690 Pneumonitis due to inhalation of food and vomit: Secondary | ICD-10-CM | POA: Diagnosis not present

## 2018-06-10 DIAGNOSIS — I469 Cardiac arrest, cause unspecified: Secondary | ICD-10-CM | POA: Diagnosis not present

## 2018-06-10 LAB — CBC
HCT: 28.2 % — ABNORMAL LOW (ref 36.0–46.0)
HEMOGLOBIN: 8.7 g/dL — AB (ref 12.0–15.0)
MCH: 27.5 pg (ref 26.0–34.0)
MCHC: 30.9 g/dL (ref 30.0–36.0)
MCV: 89.2 fL (ref 78.0–100.0)
PLATELETS: 565 10*3/uL — AB (ref 150–400)
RBC: 3.16 MIL/uL — AB (ref 3.87–5.11)
RDW: 17.4 % — ABNORMAL HIGH (ref 11.5–15.5)
WBC: 17.2 10*3/uL — ABNORMAL HIGH (ref 4.0–10.5)

## 2018-06-10 LAB — RENAL FUNCTION PANEL
ALBUMIN: 2.1 g/dL — AB (ref 3.5–5.0)
ANION GAP: 15 (ref 5–15)
BUN: 76 mg/dL — ABNORMAL HIGH (ref 6–20)
CALCIUM: 8.7 mg/dL — AB (ref 8.9–10.3)
CO2: 23 mmol/L (ref 22–32)
CREATININE: 3.74 mg/dL — AB (ref 0.44–1.00)
Chloride: 89 mmol/L — ABNORMAL LOW (ref 98–111)
GFR calc non Af Amer: 15 mL/min — ABNORMAL LOW (ref >60)
GFR, EST AFRICAN AMERICAN: 17 mL/min — AB (ref >60)
GLUCOSE: 105 mg/dL — AB (ref 70–99)
PHOSPHORUS: 4.4 mg/dL (ref 2.5–4.6)
Potassium: 3.9 mmol/L (ref 3.5–5.1)
SODIUM: 127 mmol/L — AB (ref 135–145)

## 2018-06-10 NOTE — Progress Notes (Signed)
Sutures removed today from left arm after previous brachial pseudoaneurysm repair.  Wound healed.  Vascular surgery will sign off.  Cephus Shelling, MD [facility] Office: [phone] Pager: [phone]

## 2018-06-10 NOTE — Progress Notes (Signed)
Pulmonary Critical Care Medicine [facility] [place]   PULMONARY CRITICAL CARE SERVICE  PROGRESS NOTE  Date of Service: 06/10/2018  Lori Gutierrez  EVO:[id number]  DOB: [birth year]   DOA: 05/25/2018  Referring Physician: Carron Curie, MD  HPI: Lori Gutierrez is a 37 y.o. female seen for follow up of Acute on Chronic Respiratory Failure.  Patient is on full support at this time she is on assist control mode.  Currently is requiring 40% oxygen.  The family has been discussing the possibility of comfort care  Medications: Reviewed on Rounds  Physical Exam:  Vitals: Temperature 98.7 pulse 90 respiratory rate 15 blood pressure 189/90 saturations are 98%  Ventilator Settings mode of ventilation assist control FiO2 40% tidal volume 389 PEEP 5  . General: Comfortable at this time . Eyes: Grossly normal lids, irises & conjunctiva . ENT: grossly tongue is normal . Neck: no obvious mass . Cardiovascular: S1 S2 normal no gallop . Respiratory: Coarse breath sounds no rhonchi . Abdomen: soft . Skin: no rash seen on limited exam . Musculoskeletal: not rigid . Psychiatric:unable to assess . Neurologic: no seizure no involuntary movements         Lab Data:   Basic Metabolic Panel: Recent Labs  Lab 06/05/18 1618 06/08/18 0703 06/10/18 0533  NA 135 131* 127*  K 3.5 4.7 3.9  CL 97* 92* 89*  CO2 28 27 23   GLUCOSE 119* 96 105*  BUN 19 71* 76*  CREATININE 1.62* 3.79* 3.74*  CALCIUM 8.6* 9.0 8.7*  PHOS 1.6* 4.6 4.4    ABG: No results for input(s): PHART, PCO2ART, PO2ART, HCO3, O2SAT in the last 168 hours.  Liver Function Tests: Recent Labs  Lab 06/05/18 1618 06/08/18 0703 06/10/18 0533  ALBUMIN 2.3* 2.3* 2.1*   No results for input(s): LIPASE, AMYLASE in the last 168 hours. No results for input(s): AMMONIA in the last 168 hours.  CBC: Recent Labs  Lab 06/05/18 1618 06/08/18 0703 06/10/18 0533  WBC 15.4* 17.8* 17.2*  HGB 9.7* 9.2* 8.7*  HCT 32.8* 31.3* 28.2*   MCV 93.2 93.4 89.2  PLT 559* 499* 565*    Cardiac Enzymes: No results for input(s): CKTOTAL, CKMB, CKMBINDEX, TROPONINI in the last 168 hours.  BNP (last 3 results) No results for input(s): BNP in the last 8760 hours.  ProBNP (last 3 results) No results for input(s): PROBNP in the last 8760 hours.  Radiological Exams: No results found.  Assessment/Plan Principal Problem:   Acute on chronic respiratory failure with hypoxia (HCC) Active Problems:   Aspiration pneumonia (HCC)   Seizure disorder (HCC)   Cardiac arrest (HCC)   End stage renal disease on dialysis (HCC)   Chronic systolic heart failure (HCC)   1. Acute on chronic respiratory failure with hypoxia we will continue with the full vent support right now her mechanics are poor patient's not able to tolerate weaning.  Titrate oxygen as tolerated.  As noted above the family is considering making the patient comfort care we will continue to discuss 2. Pneumonia due to aspiration treated 3. Seizure disorder she has not had any active seizures 4. Cardiac arrest rhythm has been stable 5. End-stage renal disease on dialysis 6. Chronic systolic heart failure need to monitor her fluid status closely   I have personally seen and evaluated the patient, evaluated laboratory and imaging results, formulated the assessment and plan and placed orders. The Patient requires high complexity decision making for assessment and support.  Case was discussed on Rounds  with the Respiratory Therapy Staff  Allyne Gee, MD [facility] Pulmonary Critical Care Medicine Sleep Medicine

## 2018-06-10 NOTE — Progress Notes (Signed)
[facility]  ROUNDING NOTE   Subjective:  Patient seen at bedside. She did complete hemodialysis today. Ultrafiltration achieved was 1.6 kg.   Objective:  Vital signs in last 24 hours:  Temperature 98.7 pulse 90 respirations 15 blood pressure 189/90  Physical Exam: General: No acute distress  Head: [place]/AT NG in place  Eyes: Anicteric  Neck: Tracheostomy in place  Lungs:  Scattered rhonchi, vent assisted  Heart: S1S2 no rubs  Abdomen:  Soft, nontender, bowel sounds present  Extremities: trace peripheral edema.  Neurologic: Awake, follows commands  Skin: No lesions  Access: L IJ permcath    Basic Metabolic Panel: Recent Labs  Lab 06/05/18 1618 06/08/18 0703 06/10/18 0533  NA 135 131* 127*  K 3.5 4.7 3.9  CL 97* 92* 89*  CO2 28 27 23   GLUCOSE 119* 96 105*  BUN 19 71* 76*  CREATININE 1.62* 3.79* 3.74*  CALCIUM 8.6* 9.0 8.7*  PHOS 1.6* 4.6 4.4    Liver Function Tests: Recent Labs  Lab 06/05/18 1618 06/08/18 0703 06/10/18 0533  ALBUMIN 2.3* 2.3* 2.1*   No results for input(s): LIPASE, AMYLASE in the last 168 hours. No results for input(s): AMMONIA in the last 168 hours.  CBC: Recent Labs  Lab 06/05/18 1618 06/08/18 0703 06/10/18 0533  WBC 15.4* 17.8* 17.2*  HGB 9.7* 9.2* 8.7*  HCT 32.8* 31.3* 28.2*  MCV 93.2 93.4 89.2  PLT 559* 499* 565*    Cardiac Enzymes: No results for input(s): CKTOTAL, CKMB, CKMBINDEX, TROPONINI in the last 168 hours.  BNP: Invalid input(s): POCBNP  CBG: No results for input(s): GLUCAP in the last 168 hours.  Microbiology: Results for orders placed or performed during the hospital encounter of 2018-05-22  Culture, respiratory (non-expectorated)     Status: None   Collection Time: 05/25/18  4:10 PM  Result Value Ref Range Status   Specimen Description TRACHEAL ASPIRATE  Final   Special Requests NONE  Final   Gram Stain   Final    RARE WBC PRESENT, PREDOMINANTLY PMN FEW GRAM POSITIVE COCCI Performed at  [facility], [address], [place], [place] [address]    Culture   Final    MODERATE ESCHERICHIA COLI Confirmed Extended Spectrum Beta-Lactamase Producer (ESBL).  In bloodstream infections from ESBL organisms, carbapenems are preferred over piperacillin/tazobactam. They are shown to have a lower risk of mortality.    Report Status 05/28/2018 FINAL  Final   Organism ID, Bacteria ESCHERICHIA COLI  Final      Susceptibility   Escherichia coli - MIC*    AMPICILLIN >=32 RESISTANT Resistant     CEFAZOLIN >=64 RESISTANT Resistant     CEFEPIME RESISTANT Resistant     CEFTAZIDIME >=64 RESISTANT Resistant     CEFTRIAXONE >=64 RESISTANT Resistant     CIPROFLOXACIN >=4 RESISTANT Resistant     GENTAMICIN <=1 SENSITIVE Sensitive     IMIPENEM <=0.25 SENSITIVE Sensitive     TRIMETH/SULFA <=20 SENSITIVE Sensitive     AMPICILLIN/SULBACTAM >=32 RESISTANT Resistant     PIP/TAZO >=128 RESISTANT Resistant     Extended ESBL POSITIVE Resistant     * MODERATE ESCHERICHIA COLI  C difficile quick scan w PCR reflex     Status: Abnormal   Collection Time: 05/25/18  4:27 PM  Result Value Ref Range Status   C Diff antigen POSITIVE (A) NEGATIVE Final   C Diff toxin NEGATIVE NEGATIVE Final   C Diff interpretation Results are indeterminate. See PCR results.  Final  Comment: Performed at [facility], [address] [place], [place] [address]  C. Diff by PCR, Reflexed     Status: Abnormal   Collection Time: 05/25/18  4:27 PM  Result Value Ref Range Status   Toxigenic C. Difficile by PCR POSITIVE (A) NEGATIVE Final    Comment: Positive for toxigenic C. difficile with little to no toxin production. Only treat if clinical presentation suggests symptomatic illness. Performed at [facility], [address], [place], [place] [address]   Culture, blood (routine x 2)     Status: None   Collection Time: 05/25/18  8:20 PM  Result Value Ref Range Status   Specimen Description BLOOD LEFT WRIST   Final   Special Requests   Final    BOTTLES DRAWN AEROBIC ONLY Blood Culture results may not be optimal due to an inadequate volume of blood received in culture bottles   Culture   Final    NO GROWTH 5 DAYS Performed at [facility], [address] [place], [place] [address]    Report Status 05/30/2018 FINAL  Final  Culture, blood (routine x 2)     Status: None   Collection Time: 05/25/18  8:28 PM  Result Value Ref Range Status   Specimen Description BLOOD LEFT HAND  Final   Special Requests   Final    BOTTLES DRAWN AEROBIC ONLY Blood Culture results may not be optimal due to an inadequate volume of blood received in culture bottles   Culture   Final    NO GROWTH 5 DAYS Performed at [facility], [address], [place], [place] [address]    Report Status 05/30/2018 FINAL  Final    Coagulation Studies: No results for input(s): LABPROT, INR in the last 72 hours.  Urinalysis: No results for input(s): COLORURINE, LABSPEC, PHURINE, GLUCOSEU, HGBUR, BILIRUBINUR, KETONESUR, PROTEINUR, UROBILINOGEN, NITRITE, LEUKOCYTESUR in the last 72 hours.  Invalid input(s): APPERANCEUR    Imaging: No results found.   Medications:       Assessment/ Plan:  37 y.o. female with a PMHx of recent acute respiratory failure requiring intubation, aspiration pneumonia, cardiac arrest, chronic systolic heart failure, delay of cognitive development, end-stage renal disease on hemodialysis MWF, hypertension, seizure disorder, who was admitted to [facility] on 04/30/2018 for ongoing management of acute respiratory failure, recent PEA arrest, and end-stage renal disease.   1.  ESRD on HD.    Patient completed hemodialysis today.  Ultrafiltration achieved was 1.6 kg.  Tentatively we will plan for the next dialysis treatment on Friday unless patient's guardian opts for comfort care.  2.  Acute respiratory failure.  Secondary to aspiration.   -Patient remains on the ventilator at this  time.  Continue ventilatory support for now.  3.  Anemia of chronic kidney disease.  Hemoglobin is dropped a bit further to 8.7.  Continue to monitor.  No indication for transfusion at the moment.  4.  Secondary hyperparathyroidism.  Serum phosphorus remains in the appropriate range at 4.4.  Continue to monitor.  5.  Hypertension:   Blood pressure remains labile and there are periods of good control.  Therefore we will continue current doses of amlodipine, clonidine, hydralazine, lisinopril, and metoprolol.     LOS: 0 Lori Gutierrez 9/11/201911:35 AM

## 2018-06-11 DIAGNOSIS — J9621 Acute and chronic respiratory failure with hypoxia: Secondary | ICD-10-CM | POA: Diagnosis not present

## 2018-06-11 DIAGNOSIS — J690 Pneumonitis due to inhalation of food and vomit: Secondary | ICD-10-CM | POA: Diagnosis not present

## 2018-06-11 DIAGNOSIS — I5022 Chronic systolic (congestive) heart failure: Secondary | ICD-10-CM | POA: Diagnosis not present

## 2018-06-11 DIAGNOSIS — I469 Cardiac arrest, cause unspecified: Secondary | ICD-10-CM | POA: Diagnosis not present

## 2018-06-11 LAB — RENAL FUNCTION PANEL
ANION GAP: 10 (ref 5–15)
Albumin: 2.1 g/dL — ABNORMAL LOW (ref 3.5–5.0)
BUN: 42 mg/dL — ABNORMAL HIGH (ref 6–20)
CALCIUM: 8.7 mg/dL — AB (ref 8.9–10.3)
CO2: 27 mmol/L (ref 22–32)
Chloride: 94 mmol/L — ABNORMAL LOW (ref 98–111)
Creatinine, Ser: 2.61 mg/dL — ABNORMAL HIGH (ref 0.44–1.00)
GFR, EST AFRICAN AMERICAN: 26 mL/min — AB (ref >60)
GFR, EST NON AFRICAN AMERICAN: 22 mL/min — AB (ref >60)
Glucose, Bld: 108 mg/dL — ABNORMAL HIGH (ref 70–99)
PHOSPHORUS: 2.7 mg/dL (ref 2.5–4.6)
Potassium: 3.7 mmol/L (ref 3.5–5.1)
SODIUM: 131 mmol/L — AB (ref 135–145)

## 2018-06-11 LAB — CBC
HCT: 28.3 % — ABNORMAL LOW (ref 36.0–46.0)
HEMOGLOBIN: 8.6 g/dL — AB (ref 12.0–15.0)
MCH: 26.9 pg (ref 26.0–34.0)
MCHC: 30.4 g/dL (ref 30.0–36.0)
MCV: 88.4 fL (ref 78.0–100.0)
Platelets: 497 10*3/uL — ABNORMAL HIGH (ref 150–400)
RBC: 3.2 MIL/uL — AB (ref 3.87–5.11)
RDW: 17.4 % — ABNORMAL HIGH (ref 11.5–15.5)
WBC: 12.9 10*3/uL — ABNORMAL HIGH (ref 4.0–10.5)

## 2018-06-11 NOTE — Progress Notes (Signed)
Pulmonary Critical Care Medicine [facility]   PULMONARY CRITICAL CARE SERVICE  PROGRESS NOTE  Date of Service: 06/11/2018  Lori NobleMegan C Gutierrez  ZOX:[id number]  DOB: 01-04-1981   DOA: 10-Oct-2017  Referring Physician: Carron CurieAli Hijazi, MD  HPI: Lori Gutierrez is a 37 y.o. female seen for follow up of Acute on Chronic Respiratory Failure.  Patient is on full support right now on assist control mode has been on 40% oxygen  Medications: Reviewed on Rounds  Physical Exam:  Vitals: Temperature 98.1 pulse 93 respiratory rate 18 blood pressure 133/85 saturations 100%  Ventilator Settings mode of ventilation assist control FiO2 40% tidal volume 350 PEEP 5  . General: Comfortable at this time . Eyes: Grossly normal lids, irises & conjunctiva . ENT: grossly tongue is normal . Neck: no obvious mass . Cardiovascular: S1 S2 normal no gallop . Respiratory: Coarse breath sounds no rales rhonchi are noted . Abdomen: soft . Skin: no rash seen on limited exam . Musculoskeletal: not rigid . Psychiatric:unable to assess . Neurologic: no seizure no involuntary movements         Lab Data:   Basic Metabolic Panel: Recent Labs  Lab 06/05/18 1618 06/08/18 0703 06/10/18 0533 06/11/18 1006  NA 135 131* 127* 131*  K 3.5 4.7 3.9 3.7  CL 97* 92* 89* 94*  CO2 28 27 23 27   GLUCOSE 119* 96 105* 108*  BUN 19 71* 76* 42*  CREATININE 1.62* 3.79* 3.74* 2.61*  CALCIUM 8.6* 9.0 8.7* 8.7*  PHOS 1.6* 4.6 4.4 2.7    ABG: No results for input(s): PHART, PCO2ART, PO2ART, HCO3, O2SAT in the last 168 hours.  Liver Function Tests: Recent Labs  Lab 06/05/18 1618 06/08/18 0703 06/10/18 0533 06/11/18 1006  ALBUMIN 2.3* 2.3* 2.1* 2.1*   No results for input(s): LIPASE, AMYLASE in the last 168 hours. No results for input(s): AMMONIA in the last 168 hours.  CBC: Recent Labs  Lab 06/05/18 1618 06/08/18 0703 06/10/18 0533 06/11/18 1006  WBC 15.4* 17.8* 17.2* 12.9*  HGB 9.7* 9.2* 8.7*  8.6*  HCT 32.8* 31.3* 28.2* 28.3*  MCV 93.2 93.4 89.2 88.4  PLT 559* 499* 565* 497*    Cardiac Enzymes: No results for input(s): CKTOTAL, CKMB, CKMBINDEX, TROPONINI in the last 168 hours.  BNP (last 3 results) No results for input(s): BNP in the last 8760 hours.  ProBNP (last 3 results) No results for input(s): PROBNP in the last 8760 hours.  Radiological Exams: No results found.  Assessment/Plan Principal Problem:   Acute on chronic respiratory failure with hypoxia (HCC) Active Problems:   Aspiration pneumonia (HCC)   Seizure disorder (HCC)   Cardiac arrest (HCC)   End stage renal disease on dialysis (HCC)   Chronic systolic heart failure (HCC)   1. Acute on chronic respiratory failure with hypoxia we will continue with full vent support.  She is failed weaning attempts right now remains in assist control mode on 40% FiO2 2. Pneumonia due to aspiration treated with antibiotics we will continue supportive care 3. Seizure disorder no active seizures noted. 4. End-stage renal disease on dialysis will monitor 5. Chronic systolic heart failure at baseline   I have personally seen and evaluated the patient, evaluated laboratory and imaging results, formulated the assessment and plan and placed orders. The Patient requires high complexity decision making for assessment and support.  Case was discussed on Rounds with the Respiratory Therapy Staff  Yevonne PaxSaadat A Divya Munshi, MD [facility] Pulmonary Critical Care Medicine Sleep Medicine

## 2018-06-12 ENCOUNTER — Other Ambulatory Visit (HOSPITAL_COMMUNITY): Payer: Self-pay

## 2018-06-12 LAB — RENAL FUNCTION PANEL
ALBUMIN: 2.2 g/dL — AB (ref 3.5–5.0)
Anion gap: 11 (ref 5–15)
BUN: 61 mg/dL — AB (ref 6–20)
CO2: 27 mmol/L (ref 22–32)
CREATININE: 3.14 mg/dL — AB (ref 0.44–1.00)
Calcium: 8.9 mg/dL (ref 8.9–10.3)
Chloride: 93 mmol/L — ABNORMAL LOW (ref 98–111)
GFR calc non Af Amer: 18 mL/min — ABNORMAL LOW (ref >60)
GFR, EST AFRICAN AMERICAN: 21 mL/min — AB (ref >60)
Glucose, Bld: 123 mg/dL — ABNORMAL HIGH (ref 70–99)
PHOSPHORUS: 3.2 mg/dL (ref 2.5–4.6)
Potassium: 3.7 mmol/L (ref 3.5–5.1)
Sodium: 131 mmol/L — ABNORMAL LOW (ref 135–145)

## 2018-06-12 LAB — CBC
HEMATOCRIT: 28.5 % — AB (ref 36.0–46.0)
Hemoglobin: 8.6 g/dL — ABNORMAL LOW (ref 12.0–15.0)
MCH: 26.9 pg (ref 26.0–34.0)
MCHC: 30.2 g/dL (ref 30.0–36.0)
MCV: 89.1 fL (ref 78.0–100.0)
PLATELETS: 536 10*3/uL — AB (ref 150–400)
RBC: 3.2 MIL/uL — AB (ref 3.87–5.11)
RDW: 17.5 % — AB (ref 11.5–15.5)
WBC: 13.9 10*3/uL — AB (ref 4.0–10.5)

## 2018-06-12 NOTE — Consult Note (Signed)
[place] [facility]iat[facility] Consult   Reason for Consult:  Depressed on Zoloft Referring Physician:  Dr. Laren Everts Patient Identification: Lori Gutierrez MRN:  [id number] Principal Diagnosis: Acute on chronic respiratory failure with hypoxia [facility]) Diagnosis:   Patient Active Problem List   Diagnosis Date Noted  . Acute on chronic respiratory failure with hypoxia (HCC) [J96.21]   . Aspiration pneumonia ([place]) [J69.0]   . Seizure disorder ([place]) [P37.902]   . Cardiac arrest ([place]) [I46.9]   . End stage renal disease on dialysis ([place]) [N18.6, Z99.2]   . Chronic systolic heart failure (HCC) [I50.22]     Total Time spent with patient: 1 hour  Subjective:   Lori Gutierrez is a 37 y.o. female patient admitted with respiratory failure and further medical management.  HPI:  Patient with PMHx of  aspiration pneumonia, cardiac arrest, chronic systolic heart failure, delay of cognitive development, end-stage renal disease on hemodialysis MWF, hypertension, seizure disorder,recent acute respiratory failure requiring intubation who was admitted to [facility]/30[facility]2018 for ongoing management of acute respiratory failure, recent PEA arrest, and end-stage renal disease.  She initially presented to [facility] with arm swelling.  She unfortunately suffered a PEA arrest probably secondary to aspiration while eating and then had subsequent seizure episode. Patient is too weak to give detail information regarding her current admission. However, she reports low energy level, lack of motivation,  feeling depressed and hopeless about her current medical conditions. She does not want to be bothered and politely asked to be left alone so that she can sleep.  Past Psychiatric History: none reported prior this admission  Risk to Self:   denies  Risk to Others:  denies  Prior Inpatient Therapy:   Prior Outpatient Therapy:    Past Medical History:  Past Medical History:  Diagnosis Date  .  Acute on chronic respiratory failure with hypoxia ([place])   . Aspiration pneumonia ([place])   . Cardiac arrest ([place])   . Chronic systolic heart failure ([place])   . Delay of cognitive development   . End stage renal disease on dialysis ([place])   . Hypertension   . Seizure disorder [facility])     Past Surgical History:  Procedure Laterality Date  . ARTERY REPAIR Left 05/07/2018   Procedure: BRACHIAL ARTERY PSEUDOANEURYSM REPAIR WITH HEMATOMA EVACUATION;  Surgeon: Marty Heck, MD;  Location: [place];  Service: Vascular;  Laterality: Left;  . CLOSED REDUCTION PROXIMAL ULNAR FRACTURE    . TRACHEOSTOMY TUBE PLACEMENT N/A 05/08/2018   Procedure: TRACHEOSTOMY;  Surgeon: Rozetta Nunnery, MD;  Location: [place];  Service: General;  Laterality: N/A;   Family History:  Family History  Family history unknown: Yes   Family Psychiatric  History:  Social History:  Social History   Substance and Sexual Activity  Alcohol Use Not Currently     Social History   Substance and Sexual Activity  Drug Use Not Currently    Social History   Socioeconomic History  . Marital status: Single    Spouse name: Not on file  . Number of children: Not on file  . Years of education: Not on file  . Highest education level: Not on file  Occupational History  . Not on file  Social Needs  . Financial resource strain: Not on file  . Food insecurity:    Worry: Not on file    Inability: Not on file  . Transportation needs:    Medical: Not on file  Non-medical: Not on file  Tobacco Use  . Smoking status: Never Smoker  . Smokeless tobacco: Never Used  Substance and Sexual Activity  . Alcohol use: Not Currently  . Drug use: Not Currently  . Sexual activity: Not Currently  Lifestyle  . Physical activity:    Days per week: Not on file    Minutes per session: Not on file  . Stress: Not on file  Relationships  . Social connections:    Talks on phone: Not on file    Gets together: Not on file    Attends  religious service: Not on file    Active member of club or organization: Not on file    Attends meetings of clubs or organizations: Not on file    Relationship status: Not on file  Other Topics Concern  . Not on file  Social History Narrative  . Not on file   Additional Social History:    Allergies:  Not on File  Labs:  Results for orders placed or performed during the hospital encounter of 05/22/2018 (from the past 48 hour(s))  CBC     Status: Abnormal   Collection Time: 06/11/18 10:06 AM  Result Value Ref Range   WBC 12.9 (H) 4.0 - 10.5 K/uL   RBC 3.20 (L) 3.87 - 5.11 MIL/uL   Hemoglobin 8.6 (L) 12.0 - 15.0 g/dL   HCT 28.3 (L) 36.0 - 46.0 %   MCV 88.4 78.0 - 100.0 fL   MCH 26.9 26.0 - 34.0 pg   MCHC 30.4 30.0 - 36.0 g/dL   RDW 17.4 (H) 11.5 - 15.5 %   Platelets 497 (H) 150 - 400 K/uL    Comment: Performed at [facility], [address] [place], [place] [address]  Renal function panel     Status: Abnormal   Collection Time: 06/11/18 10:06 AM  Result Value Ref Range   Sodium 131 (L) 135 - 145 mmol/L   Potassium 3.7 3.5 - 5.1 mmol/L   Chloride 94 (L) 98 - 111 mmol/L   CO2 27 22 - 32 mmol/L   Glucose, Bld 108 (H) 70 - 99 mg/dL   BUN 42 (H) 6 - 20 mg/dL   Creatinine, Ser 2.61 (H) 0.44 - 1.00 mg/dL    Comment: RESULT REPEATED AND VERIFIED   Calcium 8.7 (L) 8.9 - 10.3 mg/dL   Phosphorus 2.7 2.5 - 4.6 mg/dL   Albumin 2.1 (L) 3.5 - 5.0 g/dL   GFR calc non Af Amer 22 (L) >60 mL/min   GFR calc Af Amer 26 (L) >60 mL/min    Comment: (NOTE) The eGFR has been calculated using the CKD EPI equation. This calculation has not been validated in all clinical situations. eGFR's persistently <60 mL/min signify possible Chronic Kidney Disease.    Anion gap 10 5 - 15    Comment: Performed at  Hospital Lab, [address] [place], [place] [address]  CBC     Status: Abnormal   Collection Time: 06/12/18  6:46 AM  Result Value Ref Range   WBC 13.9 (H) 4.0 - 10.5 K/uL   RBC  3.20 (L) 3.87 - 5.11 MIL/uL   Hemoglobin 8.6 (L) 12.0 - 15.0 g/dL   HCT 28.5 (L) 36.0 - 46.0 %   MCV 89.1 78.0 - 100.0 fL   MCH 26.9 26.0 - 34.0 pg   MCHC 30.2 30.0 - 36.0 g/dL   RDW 17.5 (H) 11.5 - 15.5 %   Platelets 536 (H) 150 -   400 K/uL    Comment: Performed at [facility], [place] [address], [place], [place] [address]  Renal function panel     Status: Abnormal   Collection Time: 06/12/18  6:46 AM  Result Value Ref Range   Sodium 131 (L) 135 - 145 mmol/L   Potassium 3.7 3.5 - 5.1 mmol/L   Chloride 93 (L) 98 - 111 mmol/L   CO2 27 22 - 32 mmol/L   Glucose, Bld 123 (H) 70 - 99 mg/dL   BUN 61 (H) 6 - 20 mg/dL   Creatinine, Ser 3.14 (H) 0.44 - 1.00 mg/dL   Calcium 8.9 8.9 - 10.3 mg/dL   Phosphorus 3.2 2.5 - 4.6 mg/dL   Albumin 2.2 (L) 3.5 - 5.0 g/dL   GFR calc non Af Amer 18 (L) >60 mL/min   GFR calc Af Amer 21 (L) >60 mL/min    Comment: (NOTE) The eGFR has been calculated using the CKD EPI equation. This calculation has not been validated in all clinical situations. eGFR's persistently <60 mL/min signify possible Chronic Kidney Disease.    Anion gap 11 5 - 15    Comment: Performed at [facility] [address] [place], [place] [address]    No current facility-administered medications for this encounter.     Musculoskeletal: Strength & Muscle Tone: not tested Gait & Station: unable to stand Patient leans: N/A  Psychiatric Specialty Exam: Physical Exam  Psychiatric: Judgment normal. Her affect is blunt. Her speech is delayed. She is slowed and withdrawn. Thought content is paranoid. Cognition and memory are normal. She exhibits a depressed mood.    Review of Systems  Psychiatric/Behavioral: Positive for depression.    There were no vitals taken for this visit.There is no height or weight on file to calculate BMI.  General Appearance: Casual  Eye Contact:  Poor  Speech:  Slow  Volume:  Decreased  Mood:  Depressed and Dysphoric  Affect:  Constricted   Thought Process:  Coherent  Orientation:  Other:  only to place and person, not to time  Thought Content:  Logical  Suicidal Thoughts:  No  Homicidal Thoughts:  No  Memory:  Immediate;   Fair Recent;   Poor Remote;   Poor  Judgement:  Fair  Insight:  Shallow  Psychomotor Activity:  Psychomotor Retardation  Concentration:  Concentration: Fair and Attention Span: Fair  Recall:  unable to assess  Fund of Knowledge:  unable to assess  Language:  Fair  Akathisia:  No  Handed:  Right  AIMS (if indicated):     Assets:  Desire for Improvement  ADL's:  marginal  Cognition:  Impaired,  Mild  Sleep:   fair     Treatment Plan Summary: Diagnosis: Adjustment disorder with depression Medication management  Increase Zoloft to 50 mg daily for depression. Consider increasing Zoloft to 75 mg daily for depression if there is no improvement in 2 weeks.  Disposition: No evidence of imminent risk to self or others at present.   Supportive therapy provided about ongoing stressors.  Corena Pilgrim, MD 06/12/2018 5:45 PM

## 2018-06-12 NOTE — Progress Notes (Signed)
[facility]  ROUNDING NOTE   Subjective:  Patient completed hemodialysis today. Still on the ventilator with FiO2 of 40% and PEEP of 5.   Objective:  Vital signs in last 24 hours:  Temperature 98.1 pulse 76 respirations 18 blood pressure 132/85  Physical Exam: General: No acute distress  Head: [place]/AT NG in place  Eyes: Anicteric  Neck: Tracheostomy in place  Lungs:  Scattered rhonchi, vent assisted  Heart: S1S2 no rubs  Abdomen:  Soft, nontender, bowel sounds present  Extremities: 1+ peripheral edema.  Neurologic: Awake, follows commands  Skin: No lesions  Access: L IJ permcath    Basic Metabolic Panel: Recent Labs  Lab 06/05/18 1618 06/08/18 0703 06/10/18 0533 06/11/18 1006 06/12/18 0646  NA 135 131* 127* 131* 131*  K 3.5 4.7 3.9 3.7 3.7  CL 97* 92* 89* 94* 93*  CO2 28 27 23 27 27   GLUCOSE 119* 96 105* 108* 123*  BUN 19 71* 76* 42* 61*  CREATININE 1.62* 3.79* 3.74* 2.61* 3.14*  CALCIUM 8.6* 9.0 8.7* 8.7* 8.9  PHOS 1.6* 4.6 4.4 2.7 3.2    Liver Function Tests: Recent Labs  Lab 06/05/18 1618 06/08/18 0703 06/10/18 0533 06/11/18 1006 06/12/18 0646  ALBUMIN 2.3* 2.3* 2.1* 2.1* 2.2*   No results for input(s): LIPASE, AMYLASE in the last 168 hours. No results for input(s): AMMONIA in the last 168 hours.  CBC: Recent Labs  Lab 06/05/18 1618 06/08/18 0703 06/10/18 0533 06/11/18 1006 06/12/18 0646  WBC 15.4* 17.8* 17.2* 12.9* 13.9*  HGB 9.7* 9.2* 8.7* 8.6* 8.6*  HCT 32.8* 31.3* 28.2* 28.3* 28.5*  MCV 93.2 93.4 89.2 88.4 89.1  PLT 559* 499* 565* 497* 536*    Cardiac Enzymes: No results for input(s): CKTOTAL, CKMB, CKMBINDEX, TROPONINI in the last 168 hours.  BNP: Invalid input(s): POCBNP  CBG: No results for input(s): GLUCAP in the last 168 hours.  Microbiology: Results for orders placed or performed during the hospital encounter of 14-Jun-2018  Culture, respiratory (non-expectorated)     Status: None   Collection Time: 05/25/18   4:10 PM  Result Value Ref Range Status   Specimen Description TRACHEAL ASPIRATE  Final   Special Requests NONE  Final   Gram Stain   Final    RARE WBC PRESENT, PREDOMINANTLY PMN FEW GRAM POSITIVE COCCI Performed at [facility], [address], [place], [place] [address]    Culture   Final    MODERATE ESCHERICHIA COLI Confirmed Extended Spectrum Beta-Lactamase Producer (ESBL).  In bloodstream infections from ESBL organisms, carbapenems are preferred over piperacillin/tazobactam. They are shown to have a lower risk of mortality.    Report Status 05/28/2018 FINAL  Final   Organism ID, Bacteria ESCHERICHIA COLI  Final      Susceptibility   Escherichia coli - MIC*    AMPICILLIN >=32 RESISTANT Resistant     CEFAZOLIN >=64 RESISTANT Resistant     CEFEPIME RESISTANT Resistant     CEFTAZIDIME >=64 RESISTANT Resistant     CEFTRIAXONE >=64 RESISTANT Resistant     CIPROFLOXACIN >=4 RESISTANT Resistant     GENTAMICIN <=1 SENSITIVE Sensitive     IMIPENEM <=0.25 SENSITIVE Sensitive     TRIMETH/SULFA <=20 SENSITIVE Sensitive     AMPICILLIN/SULBACTAM >=32 RESISTANT Resistant     PIP/TAZO >=128 RESISTANT Resistant     Extended ESBL POSITIVE Resistant     * MODERATE ESCHERICHIA COLI  C difficile quick scan w PCR reflex     Status: Abnormal  Collection Time: 05/25/18  4:27 PM  Result Value Ref Range Status   C Diff antigen POSITIVE (A) NEGATIVE Final   C Diff toxin NEGATIVE NEGATIVE Final   C Diff interpretation Results are indeterminate. See PCR results.  Final    Comment: Performed at [facility], [address] [place], [place] [address]  C. Diff by PCR, Reflexed     Status: Abnormal   Collection Time: 05/25/18  4:27 PM  Result Value Ref Range Status   Toxigenic C. Difficile by PCR POSITIVE (A) NEGATIVE Final    Comment: Positive for toxigenic C. difficile with little to no toxin production. Only treat if clinical presentation suggests symptomatic illness. Performed at [facility], [address] [place], [place] [address]   Culture, blood (routine x 2)     Status: None   Collection Time: 05/25/18  8:20 PM  Result Value Ref Range Status   Specimen Description BLOOD LEFT WRIST  Final   Special Requests   Final    BOTTLES DRAWN AEROBIC ONLY Blood Culture results may not be optimal due to an inadequate volume of blood received in culture bottles   Culture   Final    NO GROWTH 5 DAYS Performed at [facility], [address] [place], [place] [address]    Report Status 05/30/2018 FINAL  Final  Culture, blood (routine x 2)     Status: None   Collection Time: 05/25/18  8:28 PM  Result Value Ref Range Status   Specimen Description BLOOD LEFT HAND  Final   Special Requests   Final    BOTTLES DRAWN AEROBIC ONLY Blood Culture results may not be optimal due to an inadequate volume of blood received in culture bottles   Culture   Final    NO GROWTH 5 DAYS Performed at [facility], [address] [place], [place] [address]    Report Status 05/30/2018 FINAL  Final    Coagulation Studies: No results for input(s): LABPROT, INR in the last 72 hours.  Urinalysis: No results for input(s): COLORURINE, LABSPEC, PHURINE, GLUCOSEU, HGBUR, BILIRUBINUR, KETONESUR, PROTEINUR, UROBILINOGEN, NITRITE, LEUKOCYTESUR in the last 72 hours.  Invalid input(s): APPERANCEUR    Imaging: Dg Chest Port 1 View  Result Date: 06/12/2018 CLINICAL DATA:  Follow-up pneumonia EXAM: PORTABLE CHEST 1 VIEW COMPARISON:  05/31/2018 FINDINGS: Cardiac shadow is enlarged but stable. Nasogastric catheter and tracheostomy tube are again seen. The lungs are well aerated with persistent left retrocardiac opacity consistent with consolidation. Dialysis catheter is again noted and stable. No other focal infiltrate is seen. Scoliosis is again noted and stable. IMPRESSION: Stable consolidation in the left lower lobe. Tubes and lines as described. Electronically Signed   By: Alcide CleverMark  Lukens  M.D.   On: 06/12/2018 08:35   Dg Abd Portable 1v  Result Date: 06/12/2018 CLINICAL DATA:  Abdominal distension.  End-stage renal disease EXAM: PORTABLE ABDOMEN - 1 VIEW COMPARISON:  June 04, 2018 FINDINGS: Nasogastric tube tip and side port in stomach. No bowel dilatation or air-fluid level to suggest bowel obstruction. No evident free air. There is consolidation with apparent pleural effusion on the left. There is lumbar levoscoliosis. IMPRESSION: Nasogastric tube tip and side port in stomach. Bowel gas pattern unremarkable. Opacity left lung base region persists. Electronically Signed   By: Bretta BangWilliam  Woodruff III M.D.   On: 06/12/2018 11:08     Medications:       Assessment/ Plan:  37 y.o. female with  a PMHx of recent acute respiratory failure requiring intubation, aspiration pneumonia, cardiac arrest, chronic systolic heart failure, delay of cognitive development, end-stage renal disease on hemodialysis MWF, hypertension, seizure disorder, who was admitted to [facility] on 04/30/2018 for ongoing management of acute respiratory failure, recent PEA arrest, and end-stage renal disease.   1.  ESRD on HD.    Patient completed hemodialysis today.  Next dialysis scheduled for Monday.  2.  Acute respiratory failure.  Secondary to aspiration.   -Patient still on the ventilator with FiO2 40% and PEEP of 5.  Weaning from the vent as per pulmonary/critical care.  3.  Anemia of chronic kidney disease.  Hemoglobin currently 8.6.  Repeat CBC on Monday.  4.  Secondary hyperparathyroidism.  Phosphorus was checked today and acceptable at 3.2.  Repeat on Monday.  5.  Hypertension:   Blood pressure today was 132/85.  Continue amlodipine, clonidine, hydralazine, lisinopril, and metoprolol.     LOS: 0 [place] Day 9/13/201911:30 AM

## 2018-06-15 DIAGNOSIS — J690 Pneumonitis due to inhalation of food and vomit: Secondary | ICD-10-CM | POA: Diagnosis not present

## 2018-06-15 DIAGNOSIS — I469 Cardiac arrest, cause unspecified: Secondary | ICD-10-CM | POA: Diagnosis not present

## 2018-06-15 DIAGNOSIS — J9621 Acute and chronic respiratory failure with hypoxia: Secondary | ICD-10-CM | POA: Diagnosis not present

## 2018-06-15 DIAGNOSIS — I5022 Chronic systolic (congestive) heart failure: Secondary | ICD-10-CM | POA: Diagnosis not present

## 2018-06-15 LAB — RENAL FUNCTION PANEL
ALBUMIN: 2.2 g/dL — AB (ref 3.5–5.0)
ANION GAP: 18 — AB (ref 5–15)
BUN: 92 mg/dL — ABNORMAL HIGH (ref 6–20)
CHLORIDE: 89 mmol/L — AB (ref 98–111)
CO2: 24 mmol/L (ref 22–32)
Calcium: 8.9 mg/dL (ref 8.9–10.3)
Creatinine, Ser: 3.98 mg/dL — ABNORMAL HIGH (ref 0.44–1.00)
GFR, EST AFRICAN AMERICAN: 16 mL/min — AB (ref >60)
GFR, EST NON AFRICAN AMERICAN: 13 mL/min — AB (ref >60)
Glucose, Bld: 124 mg/dL — ABNORMAL HIGH (ref 70–99)
PHOSPHORUS: 5 mg/dL — AB (ref 2.5–4.6)
POTASSIUM: 4.2 mmol/L (ref 3.5–5.1)
Sodium: 131 mmol/L — ABNORMAL LOW (ref 135–145)

## 2018-06-15 LAB — CBC
HEMATOCRIT: 28.6 % — AB (ref 36.0–46.0)
HEMOGLOBIN: 8.7 g/dL — AB (ref 12.0–15.0)
MCH: 26.7 pg (ref 26.0–34.0)
MCHC: 30.4 g/dL (ref 30.0–36.0)
MCV: 87.7 fL (ref 78.0–100.0)
Platelets: 573 10*3/uL — ABNORMAL HIGH (ref 150–400)
RBC: 3.26 MIL/uL — AB (ref 3.87–5.11)
RDW: 17.3 % — AB (ref 11.5–15.5)
WBC: 14.4 10*3/uL — AB (ref 4.0–10.5)

## 2018-06-15 NOTE — Progress Notes (Signed)
[facility]  ROUNDING NOTE   Subjective:  Patient seen at bedside. Still on the ventilator. Due for hemodialysis later today.   Objective:  Vital signs in last 24 hours:  Temperature 98.2 pulse 64 respiration 17 blood pressure 105/71  Physical Exam: General: No acute distress  Head: [place]/AT NG in place  Eyes: Anicteric  Neck: Tracheostomy in place  Lungs:  Scattered rhonchi, vent assisted  Heart: S1S2 no rubs  Abdomen:  Soft, nontender, bowel sounds present  Extremities: 1+ peripheral edema.  Neurologic: Awake, follows commands  Skin: No lesions  Access: L IJ permcath    Basic Metabolic Panel: Recent Labs  Lab 06/10/18 0533 06/11/18 1006 06/12/18 0646  NA 127* 131* 131*  K 3.9 3.7 3.7  CL 89* 94* 93*  CO2 23 27 27   GLUCOSE 105* 108* 123*  BUN 76* 42* 61*  CREATININE 3.74* 2.61* 3.14*  CALCIUM 8.7* 8.7* 8.9  PHOS 4.4 2.7 3.2    Liver Function Tests: Recent Labs  Lab 06/10/18 0533 06/11/18 1006 06/12/18 0646  ALBUMIN 2.1* 2.1* 2.2*   No results for input(s): LIPASE, AMYLASE in the last 168 hours. No results for input(s): AMMONIA in the last 168 hours.  CBC: Recent Labs  Lab 06/10/18 0533 06/11/18 1006 06/12/18 0646  WBC 17.2* 12.9* 13.9*  HGB 8.7* 8.6* 8.6*  HCT 28.2* 28.3* 28.5*  MCV 89.2 88.4 89.1  PLT 565* 497* 536*    Cardiac Enzymes: No results for input(s): CKTOTAL, CKMB, CKMBINDEX, TROPONINI in the last 168 hours.  BNP: Invalid input(s): POCBNP  CBG: No results for input(s): GLUCAP in the last 168 hours.  Microbiology: Results for orders placed or performed during the hospital encounter of 07/26/18  Culture, respiratory (non-expectorated)     Status: None   Collection Time: 05/25/18  4:10 PM  Result Value Ref Range Status   Specimen Description TRACHEAL ASPIRATE  Final   Special Requests NONE  Final   Gram Stain   Final    RARE WBC PRESENT, PREDOMINANTLY PMN FEW GRAM POSITIVE COCCI Performed at [facility], [address] [place], [place] [address]    Culture   Final    MODERATE ESCHERICHIA COLI Confirmed Extended Spectrum Beta-Lactamase Producer (ESBL).  In bloodstream infections from ESBL organisms, carbapenems are preferred over piperacillin/tazobactam. They are shown to have a lower risk of mortality.    Report Status 05/28/2018 FINAL  Final   Organism ID, Bacteria ESCHERICHIA COLI  Final      Susceptibility   Escherichia coli - MIC*    AMPICILLIN >=32 RESISTANT Resistant     CEFAZOLIN >=64 RESISTANT Resistant     CEFEPIME RESISTANT Resistant     CEFTAZIDIME >=64 RESISTANT Resistant     CEFTRIAXONE >=64 RESISTANT Resistant     CIPROFLOXACIN >=4 RESISTANT Resistant     GENTAMICIN <=1 SENSITIVE Sensitive     IMIPENEM <=0.25 SENSITIVE Sensitive     TRIMETH/SULFA <=20 SENSITIVE Sensitive     AMPICILLIN/SULBACTAM >=32 RESISTANT Resistant     PIP/TAZO >=128 RESISTANT Resistant     Extended ESBL POSITIVE Resistant     * MODERATE ESCHERICHIA COLI  C difficile quick scan w PCR reflex     Status: Abnormal   Collection Time: 05/25/18  4:27 PM  Result Value Ref Range Status   C Diff antigen POSITIVE (A) NEGATIVE Final   C Diff toxin NEGATIVE NEGATIVE Final   C Diff interpretation Results are indeterminate. See PCR results.  Final  Comment: Performed at [facility], [address] [place], [place] [address]  C. Diff by PCR, Reflexed     Status: Abnormal   Collection Time: 05/25/18  4:27 PM  Result Value Ref Range Status   Toxigenic C. Difficile by PCR POSITIVE (A) NEGATIVE Final    Comment: Positive for toxigenic C. difficile with little to no toxin production. Only treat if clinical presentation suggests symptomatic illness. Performed at [facility], [address] [place], [place] [address]   Culture, blood (routine x 2)     Status: None   Collection Time: 05/25/18  8:20 PM  Result Value Ref Range Status   Specimen Description BLOOD LEFT WRIST  Final   Special  Requests   Final    BOTTLES DRAWN AEROBIC ONLY Blood Culture results may not be optimal due to an inadequate volume of blood received in culture bottles   Culture   Final    NO GROWTH 5 DAYS Performed at [facility], [address] [place], [place] [address]    Report Status 05/30/2018 FINAL  Final  Culture, blood (routine x 2)     Status: None   Collection Time: 05/25/18  8:28 PM  Result Value Ref Range Status   Specimen Description BLOOD LEFT HAND  Final   Special Requests   Final    BOTTLES DRAWN AEROBIC ONLY Blood Culture results may not be optimal due to an inadequate volume of blood received in culture bottles   Culture   Final    NO GROWTH 5 DAYS Performed at [facility], [address] [place], [place] [address]    Report Status 05/30/2018 FINAL  Final    Coagulation Studies: No results for input(s): LABPROT, INR in the last 72 hours.  Urinalysis: No results for input(s): COLORURINE, LABSPEC, PHURINE, GLUCOSEU, HGBUR, BILIRUBINUR, KETONESUR, PROTEINUR, UROBILINOGEN, NITRITE, LEUKOCYTESUR in the last 72 hours.  Invalid input(s): APPERANCEUR    Imaging: No results found.   Medications:       Assessment/ Plan:  37 y.o. female with a PMHx of recent acute respiratory failure requiring intubation, aspiration pneumonia, cardiac arrest, chronic systolic heart failure, delay of cognitive development, end-stage renal disease on hemodialysis MWF, hypertension, seizure disorder, who was admitted to [facility] on 04/30/2018 for ongoing management of acute respiratory failure, recent PEA arrest, and end-stage renal disease.   1.  ESRD on HD.    Patient due for hemodialysis today.  Orders have been prepared.  2.  Acute respiratory failure.  Secondary to aspiration.   -Respiratory failure persist.  She remains on the ventilator.  Settings remained stable with an FiO2 of 40% and PEEP of 5.  3.  Anemia of chronic kidney disease.  Awaiting repeat CBC today.   Most recent hemoglobin was 8.6.  Continue to monitor.  4.  Secondary hyperparathyroidism.  We plan to repeat serum phosphorus today.  Previously serum phosphorus was 3.2.  5.  Hypertension:   Blood pressure under good control now.  Continue amlodipine, clonidine, hydralazine, lisinopril, and metoprolol.     LOS: 0 Devi Hopman 9/16/20198:28 AM

## 2018-06-15 NOTE — Progress Notes (Signed)
Pulmonary Critical Care Medicine [facility]   PULMONARY CRITICAL CARE SERVICE  PROGRESS NOTE  Date of Service: 06/15/2018  Lori Gutierrez  XBM:[id number]  DOB: 1981-06-06   DOA: Jul 21, 2018  Referring Physician: Carron CurieAli Hijazi, MD  HPI: Lori NobleMegan C Gutierrez is a 37 y.o. female seen for follow up of Acute on Chronic Respiratory Failure.  Patient Raynaud's on full support has been on assist control mode.  She has not been able to do much in the way of weaning.  She was on dialysis and after dialysis finished she will be reassessed  Medications: Reviewed on Rounds  Physical Exam:  Vitals: Temperature 98.2 pulse 64 respiratory rate 17 blood pressure 105/71 saturation 96%  Ventilator Settings mode of ventilation assist control FiO2 40% tidal volume 520 PEEP 5  . General: Comfortable at this time . Eyes: Grossly normal lids, irises & conjunctiva . ENT: grossly tongue is normal . Neck: no obvious mass . Cardiovascular: S1 S2 normal no gallop . Respiratory: No rhonchi or rales are noted at this time . Abdomen: soft . Skin: no rash seen on limited exam . Musculoskeletal: not rigid . Psychiatric:unable to assess . Neurologic: no seizure no involuntary movements         Lab Data:   Basic Metabolic Panel: Recent Labs  Lab 06/10/18 0533 06/11/18 1006 06/12/18 0646 06/15/18 0500  NA 127* 131* 131* 131*  K 3.9 3.7 3.7 4.2  CL 89* 94* 93* 89*  CO2 23 27 27 24   GLUCOSE 105* 108* 123* 124*  BUN 76* 42* 61* 92*  CREATININE 3.74* 2.61* 3.14* 3.98*  CALCIUM 8.7* 8.7* 8.9 8.9  PHOS 4.4 2.7 3.2 5.0*    ABG: No results for input(s): PHART, PCO2ART, PO2ART, HCO3, O2SAT in the last 168 hours.  Liver Function Tests: Recent Labs  Lab 06/10/18 0533 06/11/18 1006 06/12/18 0646 06/15/18 0500  ALBUMIN 2.1* 2.1* 2.2* 2.2*   No results for input(s): LIPASE, AMYLASE in the last 168 hours. No results for input(s): AMMONIA in the last 168 hours.  CBC: Recent Labs  Lab  06/10/18 0533 06/11/18 1006 06/12/18 0646 06/15/18 0618  WBC 17.2* 12.9* 13.9* 14.4*  HGB 8.7* 8.6* 8.6* 8.7*  HCT 28.2* 28.3* 28.5* 28.6*  MCV 89.2 88.4 89.1 87.7  PLT 565* 497* 536* 573*    Cardiac Enzymes: No results for input(s): CKTOTAL, CKMB, CKMBINDEX, TROPONINI in the last 168 hours.  BNP (last 3 results) No results for input(s): BNP in the last 8760 hours.  ProBNP (last 3 results) No results for input(s): PROBNP in the last 8760 hours.  Radiological Exams: No results found.  Assessment/Plan Principal Problem:   Acute on chronic respiratory failure with hypoxia (HCC) Active Problems:   Aspiration pneumonia (HCC)   Seizure disorder (HCC)   Cardiac arrest (HCC)   End stage renal disease on dialysis (HCC)   Chronic systolic heart failure (HCC)   1. Acute on chronic respiratory failure with hypoxia continue with full vent support check the spontaneous breathing index after she is finished with dialysis as discussed. 2. Pneumonia due to aspiration continue with supportive care 3. Seizure disorder continue monitoring no active seizures at baseline prognosis guarded 4. Cardiac arrest at baseline we will continue with present management. 5. End-stage renal disease on hemodialysis 6. Chronic systolic heart failure at baseline we will continue with supportive care   I have personally seen and evaluated the patient, evaluated laboratory and imaging results, formulated the assessment and plan and placed orders. The  Patient requires high complexity decision making for assessment and support.  Case was discussed on Rounds with the Respiratory Therapy Staff  Lori Gee, MD [facility] Pulmonary Critical Care Medicine Sleep Medicine

## 2018-06-16 DIAGNOSIS — I469 Cardiac arrest, cause unspecified: Secondary | ICD-10-CM | POA: Diagnosis not present

## 2018-06-16 DIAGNOSIS — I5022 Chronic systolic (congestive) heart failure: Secondary | ICD-10-CM | POA: Diagnosis not present

## 2018-06-16 DIAGNOSIS — J9621 Acute and chronic respiratory failure with hypoxia: Secondary | ICD-10-CM | POA: Diagnosis not present

## 2018-06-16 DIAGNOSIS — J690 Pneumonitis due to inhalation of food and vomit: Secondary | ICD-10-CM | POA: Diagnosis not present

## 2018-06-16 NOTE — Progress Notes (Signed)
Pulmonary Critical Care Medicine [facility]   PULMONARY CRITICAL CARE SERVICE  PROGRESS NOTE  Date of Service: 06/16/2018  Lori NobleMegan C Gutierrez  RUE:[id number]  DOB: Oct 26, 1980   DOA: Feb 07, 2018  Referring Physician: Carron CurieAli Hijazi, MD  HPI: Lori Gutierrez is a 37 y.o. female seen for follow up of Acute on Chronic Respiratory Failure.  Patient is on full support right now is on assist control mode currently on 35% oxygen with a PEEP 5  Medications: Reviewed on Rounds  Physical Exam:  Vitals: Temperature 97.6 pulse 70 respiratory 13 blood pressure 134/82 saturations 100%  Ventilator Settings mode of ventilation assist control FiO2 35% tidal volume 360 PEEP 5  . General: Comfortable at this time . Eyes: Grossly normal lids, irises & conjunctiva . ENT: grossly tongue is normal . Neck: no obvious mass . Cardiovascular: S1 S2 normal no gallop . Respiratory: No rhonchi or rales are noted . Abdomen: soft . Skin: no rash seen on limited exam . Musculoskeletal: not rigid . Psychiatric:unable to assess . Neurologic: no seizure no involuntary movements         Lab Data:   Basic Metabolic Panel: Recent Labs  Lab 06/10/18 0533 06/11/18 1006 06/12/18 0646 06/15/18 0500  NA 127* 131* 131* 131*  K 3.9 3.7 3.7 4.2  CL 89* 94* 93* 89*  CO2 23 27 27 24   GLUCOSE 105* 108* 123* 124*  BUN 76* 42* 61* 92*  CREATININE 3.74* 2.61* 3.14* 3.98*  CALCIUM 8.7* 8.7* 8.9 8.9  PHOS 4.4 2.7 3.2 5.0*    ABG: No results for input(s): PHART, PCO2ART, PO2ART, HCO3, O2SAT in the last 168 hours.  Liver Function Tests: Recent Labs  Lab 06/10/18 0533 06/11/18 1006 06/12/18 0646 06/15/18 0500  ALBUMIN 2.1* 2.1* 2.2* 2.2*   No results for input(s): LIPASE, AMYLASE in the last 168 hours. No results for input(s): AMMONIA in the last 168 hours.  CBC: Recent Labs  Lab 06/10/18 0533 06/11/18 1006 06/12/18 0646 06/15/18 0618  WBC 17.2* 12.9* 13.9* 14.4*  HGB 8.7* 8.6* 8.6* 8.7*   HCT 28.2* 28.3* 28.5* 28.6*  MCV 89.2 88.4 89.1 87.7  PLT 565* 497* 536* 573*    Cardiac Enzymes: No results for input(s): CKTOTAL, CKMB, CKMBINDEX, TROPONINI in the last 168 hours.  BNP (last 3 results) No results for input(s): BNP in the last 8760 hours.  ProBNP (last 3 results) No results for input(s): PROBNP in the last 8760 hours.  Radiological Exams: No results found.  Assessment/Plan Principal Problem:   Acute on chronic respiratory failure with hypoxia (HCC) Active Problems:   Aspiration pneumonia (HCC)   Seizure disorder (HCC)   Cardiac arrest (HCC)   End stage renal disease on dialysis (HCC)   Chronic systolic heart failure (HCC)   1. Acute on chronic respiratory failure with hypoxia we will continue with full support on assist control mode.  Patient is comfortable right now.  Being considered for withdrawal of life support. 2. Aspiration pneumonia treated 3. Seizure disorders no active seizures 4. Cardiac arrest at baseline 5. End-stage renal disease on dialysis continue with hemodialysis 6. Chronic systolic heart failure prognosis guarded   I have personally seen and evaluated the patient, evaluated laboratory and imaging results, formulated the assessment and plan and placed orders. The Patient requires high complexity decision making for assessment and support.  Case was discussed on Rounds with the Respiratory Therapy Staff  Yevonne PaxSaadat A Polly Barner, MD [facility] Pulmonary Critical Care Medicine Sleep Medicine

## 2018-06-17 DIAGNOSIS — J690 Pneumonitis due to inhalation of food and vomit: Secondary | ICD-10-CM | POA: Diagnosis not present

## 2018-06-17 DIAGNOSIS — I469 Cardiac arrest, cause unspecified: Secondary | ICD-10-CM | POA: Diagnosis not present

## 2018-06-17 DIAGNOSIS — J9621 Acute and chronic respiratory failure with hypoxia: Secondary | ICD-10-CM | POA: Diagnosis not present

## 2018-06-17 DIAGNOSIS — I5022 Chronic systolic (congestive) heart failure: Secondary | ICD-10-CM | POA: Diagnosis not present

## 2018-06-17 LAB — CBC
HCT: 28 % — ABNORMAL LOW (ref 36.0–46.0)
HEMOGLOBIN: 8.5 g/dL — AB (ref 12.0–15.0)
MCH: 26.9 pg (ref 26.0–34.0)
MCHC: 30.4 g/dL (ref 30.0–36.0)
MCV: 88.6 fL (ref 78.0–100.0)
Platelets: 547 10*3/uL — ABNORMAL HIGH (ref 150–400)
RBC: 3.16 MIL/uL — AB (ref 3.87–5.11)
RDW: 17.4 % — ABNORMAL HIGH (ref 11.5–15.5)
WBC: 14.4 10*3/uL — AB (ref 4.0–10.5)

## 2018-06-17 LAB — RENAL FUNCTION PANEL
ALBUMIN: 2.3 g/dL — AB (ref 3.5–5.0)
ANION GAP: 13 (ref 5–15)
BUN: 67 mg/dL — ABNORMAL HIGH (ref 6–20)
CALCIUM: 8.8 mg/dL — AB (ref 8.9–10.3)
CO2: 27 mmol/L (ref 22–32)
Chloride: 92 mmol/L — ABNORMAL LOW (ref 98–111)
Creatinine, Ser: 3.12 mg/dL — ABNORMAL HIGH (ref 0.44–1.00)
GFR, EST AFRICAN AMERICAN: 21 mL/min — AB (ref >60)
GFR, EST NON AFRICAN AMERICAN: 18 mL/min — AB (ref >60)
GLUCOSE: 116 mg/dL — AB (ref 70–99)
PHOSPHORUS: 4.1 mg/dL (ref 2.5–4.6)
Potassium: 3.7 mmol/L (ref 3.5–5.1)
SODIUM: 132 mmol/L — AB (ref 135–145)

## 2018-06-17 NOTE — Progress Notes (Signed)
[facility]  ROUNDING NOTE   Subjective:  Patient seen and evaluated during hemodialysis. Blood flow rate 400 with ultrafiltration target of 2 kg.   Objective:  Vital signs in last 24 hours:  Temperature 98.4 pulse 70 respirations 23 blood pressure 199/100  Physical Exam: General: No acute distress  Head: [place]/AT NG in place  Eyes: Anicteric  Neck: Tracheostomy in place  Lungs:  Scattered rhonchi, vent assisted  Heart: S1S2 no rubs  Abdomen:  Soft, nontender, bowel sounds present  Extremities: 1+ peripheral edema.  Neurologic: Awake, follows commands  Skin: No lesions  Access: L IJ permcath    Basic Metabolic Panel: Recent Labs  Lab 06/11/18 1006 06/12/18 0646 06/15/18 0500 06/17/18 0735  NA 131* 131* 131* 132*  K 3.7 3.7 4.2 3.7  CL 94* 93* 89* 92*  CO2 27 27 24 27   GLUCOSE 108* 123* 124* 116*  BUN 42* 61* 92* 67*  CREATININE 2.61* 3.14* 3.98* 3.12*  CALCIUM 8.7* 8.9 8.9 8.8*  PHOS 2.7 3.2 5.0* 4.1    Liver Function Tests: Recent Labs  Lab 06/11/18 1006 06/12/18 0646 06/15/18 0500 06/17/18 0735  ALBUMIN 2.1* 2.2* 2.2* 2.3*   No results for input(s): LIPASE, AMYLASE in the last 168 hours. No results for input(s): AMMONIA in the last 168 hours.  CBC: Recent Labs  Lab 06/11/18 1006 06/12/18 0646 06/15/18 0618 06/17/18 0735  WBC 12.9* 13.9* 14.4* 14.4*  HGB 8.6* 8.6* 8.7* 8.5*  HCT 28.3* 28.5* 28.6* 28.0*  MCV 88.4 89.1 87.7 88.6  PLT 497* 536* 573* 547*    Cardiac Enzymes: No results for input(s): CKTOTAL, CKMB, CKMBINDEX, TROPONINI in the last 168 hours.  BNP: Invalid input(s): POCBNP  CBG: No results for input(s): GLUCAP in the last 168 hours.  Microbiology: Results for orders placed or performed during the hospital encounter of 05/23/2018  Culture, respiratory (non-expectorated)     Status: None   Collection Time: 05/25/18  4:10 PM  Result Value Ref Range Status   Specimen Description TRACHEAL ASPIRATE  Final   Special  Requests NONE  Final   Gram Stain   Final    RARE WBC PRESENT, PREDOMINANTLY PMN FEW GRAM POSITIVE COCCI Performed at [facility], [address] [place], [place] [address]    Culture   Final    MODERATE ESCHERICHIA COLI Confirmed Extended Spectrum Beta-Lactamase Producer (ESBL).  In bloodstream infections from ESBL organisms, carbapenems are preferred over piperacillin/tazobactam. They are shown to have a lower risk of mortality.    Report Status 05/28/2018 FINAL  Final   Organism ID, Bacteria ESCHERICHIA COLI  Final      Susceptibility   Escherichia coli - MIC*    AMPICILLIN >=32 RESISTANT Resistant     CEFAZOLIN >=64 RESISTANT Resistant     CEFEPIME RESISTANT Resistant     CEFTAZIDIME >=64 RESISTANT Resistant     CEFTRIAXONE >=64 RESISTANT Resistant     CIPROFLOXACIN >=4 RESISTANT Resistant     GENTAMICIN <=1 SENSITIVE Sensitive     IMIPENEM <=0.25 SENSITIVE Sensitive     TRIMETH/SULFA <=20 SENSITIVE Sensitive     AMPICILLIN/SULBACTAM >=32 RESISTANT Resistant     PIP/TAZO >=128 RESISTANT Resistant     Extended ESBL POSITIVE Resistant     * MODERATE ESCHERICHIA COLI  C difficile quick scan w PCR reflex     Status: Abnormal   Collection Time: 05/25/18  4:27 PM  Result Value Ref Range Status   C Diff antigen POSITIVE (A) NEGATIVE Final  C Diff toxin NEGATIVE NEGATIVE Final   C Diff interpretation Results are indeterminate. See PCR results.  Final    Comment: Performed at [facility], [address], [place], [place] [address]  C. Diff by PCR, Reflexed     Status: Abnormal   Collection Time: 05/25/18  4:27 PM  Result Value Ref Range Status   Toxigenic C. Difficile by PCR POSITIVE (A) NEGATIVE Final    Comment: Positive for toxigenic C. difficile with little to no toxin production. Only treat if clinical presentation suggests symptomatic illness. Performed at [facility], [address], [place], [place] [address]   Culture, blood (routine x 2)      Status: None   Collection Time: 05/25/18  8:20 PM  Result Value Ref Range Status   Specimen Description BLOOD LEFT WRIST  Final   Special Requests   Final    BOTTLES DRAWN AEROBIC ONLY Blood Culture results may not be optimal due to an inadequate volume of blood received in culture bottles   Culture   Final    NO GROWTH 5 DAYS Performed at [facility], [address] [place], [place] [address]    Report Status 05/30/2018 FINAL  Final  Culture, blood (routine x 2)     Status: None   Collection Time: 05/25/18  8:28 PM  Result Value Ref Range Status   Specimen Description BLOOD LEFT HAND  Final   Special Requests   Final    BOTTLES DRAWN AEROBIC ONLY Blood Culture results may not be optimal due to an inadequate volume of blood received in culture bottles   Culture   Final    NO GROWTH 5 DAYS Performed at [facility], [address], [place], [place] [address]    Report Status 05/30/2018 FINAL  Final    Coagulation Studies: No results for input(s): LABPROT, INR in the last 72 hours.  Urinalysis: No results for input(s): COLORURINE, LABSPEC, PHURINE, GLUCOSEU, HGBUR, BILIRUBINUR, KETONESUR, PROTEINUR, UROBILINOGEN, NITRITE, LEUKOCYTESUR in the last 72 hours.  Invalid input(s): APPERANCEUR    Imaging: No results found.   Medications:       Assessment/ Plan:  37 y.o. female with a PMHx of recent acute respiratory failure requiring intubation, aspiration pneumonia, cardiac arrest, chronic systolic heart failure, delay of cognitive development, end-stage renal disease on hemodialysis MWF, hypertension, seizure disorder, who was admitted to [facility] on 04/30/2018 for ongoing management of acute respiratory failure, recent PEA arrest, and end-stage renal disease.   1.  ESRD on HD.    Patient seen and evaluated during hemodialysis and tolerating well.  Ultrafiltration target 2 kg.  Next dialysis on Friday.  2.  Acute respiratory failure.  Secondary to  aspiration.   -Patient remains on the ventilator.  FiO2 has been decreased to 30%.  Continue to monitor..  3.  Anemia of chronic kidney disease.  Hemoglobin currently 8.5 and stable.  Continue to monitor periodically.  4.  Secondary hyperparathyroidism.  Phosphorus at target at 4.1.  5.  Hypertension:   Blood pressure significantly higher today at 109 9/100.  Earlier in the week blood pressure was under good control.  She has periods of labile blood pressure.  Continue amlodipine, clonidine, hydralazine, lisinopril, and metoprolol.     LOS: 0 Kami Kube 9/18/201911:13 AM

## 2018-06-17 NOTE — Progress Notes (Signed)
Pulmonary Critical Care Medicine [facility]   PULMONARY CRITICAL CARE SERVICE  PROGRESS NOTE  Date of Service: 06/17/2018  Lori NobleMegan C Gutierrez  WUJ:[id number]  DOB: 13-Apr-1981   DOA: 05/17/2018  Referring Physician: Carron CurieAli Hijazi, MD  HPI: Lori NobleMegan C Lori Gutierrez is a 37 y.o. female seen for follow up of Acute on Chronic Respiratory Failure.  Patient remains on full vent support.  She is not able to wean.  She is possibly going to be made comfort care we are still waiting word.  Medications: Reviewed on Rounds  Physical Exam:  Vitals: Temperature 98.6 pulse 70 respiratory rate 23 blood pressure 199/100 saturations 98%  Ventilator Settings mode of ventilation assist control FiO2 30% tidal volume 395 PEEP 5  . General: Comfortable at this time . Eyes: Grossly normal lids, irises & conjunctiva . ENT: grossly tongue is normal . Neck: no obvious mass . Cardiovascular: S1 S2 normal no gallop . Respiratory: No rhonchi or rales are noted at this time . Abdomen: soft . Skin: no rash seen on limited exam . Musculoskeletal: not rigid . Psychiatric:unable to assess . Neurologic: no seizure no involuntary movements         Lab Data:   Basic Metabolic Panel: Recent Labs  Lab 06/11/18 1006 06/12/18 0646 06/15/18 0500 06/17/18 0735  NA 131* 131* 131* 132*  K 3.7 3.7 4.2 3.7  CL 94* 93* 89* 92*  CO2 27 27 24 27   GLUCOSE 108* 123* 124* 116*  BUN 42* 61* 92* 67*  CREATININE 2.61* 3.14* 3.98* 3.12*  CALCIUM 8.7* 8.9 8.9 8.8*  PHOS 2.7 3.2 5.0* 4.1    ABG: No results for input(s): PHART, PCO2ART, PO2ART, HCO3, O2SAT in the last 168 hours.  Liver Function Tests: Recent Labs  Lab 06/11/18 1006 06/12/18 0646 06/15/18 0500 06/17/18 0735  ALBUMIN 2.1* 2.2* 2.2* 2.3*   No results for input(s): LIPASE, AMYLASE in the last 168 hours. No results for input(s): AMMONIA in the last 168 hours.  CBC: Recent Labs  Lab 06/11/18 1006 06/12/18 0646 06/15/18 0618 06/17/18 0735   WBC 12.9* 13.9* 14.4* 14.4*  HGB 8.6* 8.6* 8.7* 8.5*  HCT 28.3* 28.5* 28.6* 28.0*  MCV 88.4 89.1 87.7 88.6  PLT 497* 536* 573* 547*    Cardiac Enzymes: No results for input(s): CKTOTAL, CKMB, CKMBINDEX, TROPONINI in the last 168 hours.  BNP (last 3 results) No results for input(s): BNP in the last 8760 hours.  ProBNP (last 3 results) No results for input(s): PROBNP in the last 8760 hours.  Radiological Exams: No results found.  Assessment/Plan Principal Problem:   Acute on chronic respiratory failure with hypoxia (HCC) Active Problems:   Aspiration pneumonia (HCC)   Seizure disorder (HCC)   Cardiac arrest (HCC)   End stage renal disease on dialysis (HCC)   Chronic systolic heart failure (HCC)   1. Acute on chronic respiratory failure with hypoxia patient is going to continue with full support on assist control will titrate the oxygen as tolerated patient right now is on 30% FiO2.  As noted above she may be made comfort care will continue with supportive care 2. Pneumonia due to aspiration treated 3. Seizure disorder has had no active seizures 4. Cardiac arrest rhythm has been stable 5. End-stage renal disease on dialysis 6. Chronic systolic heart failure continue present management   I have personally seen and evaluated the patient, evaluated laboratory and imaging results, formulated the assessment and plan and placed orders. The Patient requires high complexity decision  making for assessment and support.  Case was discussed on Rounds with the Respiratory Therapy Staff  Allyne Gee, MD [facility] Pulmonary Critical Care Medicine Sleep Medicine

## 2018-06-18 DIAGNOSIS — J690 Pneumonitis due to inhalation of food and vomit: Secondary | ICD-10-CM | POA: Diagnosis not present

## 2018-06-18 DIAGNOSIS — J9621 Acute and chronic respiratory failure with hypoxia: Secondary | ICD-10-CM | POA: Diagnosis not present

## 2018-06-18 DIAGNOSIS — I5022 Chronic systolic (congestive) heart failure: Secondary | ICD-10-CM | POA: Diagnosis not present

## 2018-06-18 DIAGNOSIS — I469 Cardiac arrest, cause unspecified: Secondary | ICD-10-CM | POA: Diagnosis not present

## 2018-06-18 NOTE — Progress Notes (Signed)
Pulmonary Critical Care Medicine [facility] [place]   PULMONARY CRITICAL CARE SERVICE  PROGRESS NOTE  Date of Service: 06/18/2018  Lori Gutierrez  ZOX:[id number]  DOB: [birth year]   DOA: 05/25/2018  Referring Physician: Carron Curie, MD  HPI: Lori Gutierrez is a 37 y.o. female seen for follow up of Acute on Chronic Respiratory Failure.  She is doing well.  She actually wanted to come off the ventilator was so we gave her a trial of T collar and she did remarkably well.  Medications: Reviewed on Rounds  Physical Exam:  Vitals: Temperature 98.6 pulse 77 respiratory rate 20.  Blood pressure 149/64 saturations 99%  Ventilator Settings patient was on the ventilator during rounds mode of ventilation with assist control FiO2 30% tidal volume 500 PEEP 5  . General: Comfortable at this time . Eyes: Grossly normal lids, irises & conjunctiva . ENT: grossly tongue is normal . Neck: no obvious mass . Cardiovascular: S1 S2 normal no gallop . Respiratory: No rhonchi or rales are noted . Abdomen: soft . Skin: no rash seen on limited exam . Musculoskeletal: not rigid . Psychiatric:unable to assess . Neurologic: no seizure no involuntary movements         Lab Data:   Basic Metabolic Panel: Recent Labs  Lab 06/11/18 1006 06/12/18 0646 06/15/18 0500 06/17/18 0735  NA 131* 131* 131* 132*  K 3.7 3.7 4.2 3.7  CL 94* 93* 89* 92*  CO2 27 27 24 27   GLUCOSE 108* 123* 124* 116*  BUN 42* 61* 92* 67*  CREATININE 2.61* 3.14* 3.98* 3.12*  CALCIUM 8.7* 8.9 8.9 8.8*  PHOS 2.7 3.2 5.0* 4.1    ABG: No results for input(s): PHART, PCO2ART, PO2ART, HCO3, O2SAT in the last 168 hours.  Liver Function Tests: Recent Labs  Lab 06/11/18 1006 06/12/18 0646 06/15/18 0500 06/17/18 0735  ALBUMIN 2.1* 2.2* 2.2* 2.3*   No results for input(s): LIPASE, AMYLASE in the last 168 hours. No results for input(s): AMMONIA in the last 168 hours.  CBC: Recent Labs  Lab 06/11/18 1006  06/12/18 0646 06/15/18 0618 06/17/18 0735  WBC 12.9* 13.9* 14.4* 14.4*  HGB 8.6* 8.6* 8.7* 8.5*  HCT 28.3* 28.5* 28.6* 28.0*  MCV 88.4 89.1 87.7 88.6  PLT 497* 536* 573* 547*    Cardiac Enzymes: No results for input(s): CKTOTAL, CKMB, CKMBINDEX, TROPONINI in the last 168 hours.  BNP (last 3 results) No results for input(s): BNP in the last 8760 hours.  ProBNP (last 3 results) No results for input(s): PROBNP in the last 8760 hours.  Radiological Exams: No results found.  Assessment/Plan Principal Problem:   Acute on chronic respiratory failure with hypoxia (HCC) Active Problems:   Aspiration pneumonia (HCC)   Seizure disorder (HCC)   Cardiac arrest (HCC)   End stage renal disease on dialysis (HCC)   Chronic systolic heart failure (HCC)   1. Acute on chronic respiratory failure with hypoxia we will continue with weaning on the T collar as indicated above.  Patient stated that she did not want to be put back on the ventilator however we do not really have clarification as to her comfort care at this time.  She is a DNR and we will continue monitoring her closely monitor her saturations. 2. Pneumonia due to aspiration treated we will continue present management has not had any active seizures. 3. Cardiac arrest rhythm has been stable. 4. End-stage renal disease patient is on dialysis tolerating it well. 5. Chronic systolic heart failure  will monitor fluid status   I have personally seen and evaluated the patient, evaluated laboratory and imaging results, formulated the assessment and plan and placed orders. The Patient requires high complexity decision making for assessment and support.  Case was discussed on Rounds with the Respiratory Therapy Staff  Yevonne PaxSaadat A Delan Ksiazek, MD [facility] Pulmonary Critical Care Medicine Sleep Medicine

## 2018-06-19 DIAGNOSIS — J9621 Acute and chronic respiratory failure with hypoxia: Secondary | ICD-10-CM | POA: Diagnosis not present

## 2018-06-19 DIAGNOSIS — I5022 Chronic systolic (congestive) heart failure: Secondary | ICD-10-CM | POA: Diagnosis not present

## 2018-06-19 DIAGNOSIS — I469 Cardiac arrest, cause unspecified: Secondary | ICD-10-CM | POA: Diagnosis not present

## 2018-06-19 DIAGNOSIS — J690 Pneumonitis due to inhalation of food and vomit: Secondary | ICD-10-CM | POA: Diagnosis not present

## 2018-06-19 LAB — RENAL FUNCTION PANEL
ALBUMIN: 2.4 g/dL — AB (ref 3.5–5.0)
ANION GAP: 15 (ref 5–15)
BUN: 57 mg/dL — ABNORMAL HIGH (ref 6–20)
CO2: 26 mmol/L (ref 22–32)
Calcium: 9 mg/dL (ref 8.9–10.3)
Chloride: 91 mmol/L — ABNORMAL LOW (ref 98–111)
Creatinine, Ser: 2.61 mg/dL — ABNORMAL HIGH (ref 0.44–1.00)
GFR calc non Af Amer: 22 mL/min — ABNORMAL LOW (ref >60)
GFR, EST AFRICAN AMERICAN: 26 mL/min — AB (ref >60)
GLUCOSE: 120 mg/dL — AB (ref 70–99)
PHOSPHORUS: 3.5 mg/dL (ref 2.5–4.6)
POTASSIUM: 3.8 mmol/L (ref 3.5–5.1)
Sodium: 132 mmol/L — ABNORMAL LOW (ref 135–145)

## 2018-06-19 LAB — CBC
HEMATOCRIT: 28.1 % — AB (ref 36.0–46.0)
HEMOGLOBIN: 8.6 g/dL — AB (ref 12.0–15.0)
MCH: 26.9 pg (ref 26.0–34.0)
MCHC: 30.6 g/dL (ref 30.0–36.0)
MCV: 87.8 fL (ref 78.0–100.0)
Platelets: 541 10*3/uL — ABNORMAL HIGH (ref 150–400)
RBC: 3.2 MIL/uL — ABNORMAL LOW (ref 3.87–5.11)
RDW: 17.4 % — ABNORMAL HIGH (ref 11.5–15.5)
WBC: 15 10*3/uL — ABNORMAL HIGH (ref 4.0–10.5)

## 2018-06-19 NOTE — Progress Notes (Signed)
[facility]ulmonary [facility] [place]   PULMONARY CRITICAL CARE SERVICE  PROGRESS NOTE  Date of Service: 06/19/2018  Asencion NobleMegan C Blitch  ZHY:[id number]  DOB: May 28, 1981   DOA: 05/17/2018  Referring Physician: Carron CurieAli Hijazi, MD  HPI: Asencion NobleMegan C Speas is a 37 y.o. female seen for follow up of Acute on Chronic Respiratory Failure.  Patient is on full vent support at this time right now on 30% oxygen yesterday did about 8 hours off the ventilator on T collar  Medications: Reviewed on Rounds  Physical Exam:  Vitals: Temperature 97.4 pulse 62 respiratory rate 26 blood pressure 144/101 saturations 97%  Ventilator Settings mode of ventilation assist control FiO2 30% tidal line 358 PEEP 5  . General: Comfortable at this time . Eyes: Grossly normal lids, irises & conjunctiva . ENT: grossly tongue is normal . Neck: no obvious mass . Cardiovascular: S1 S2 normal no gallop . Respiratory: Coarse breath sounds no rhonchi . Abdomen: soft . Skin: no rash seen on limited exam . Musculoskeletal: not rigid . Psychiatric:unable to assess . Neurologic: no seizure no involuntary movements         Lab Data:   Basic Metabolic Panel: Recent Labs  Lab 06/15/18 0500 06/17/18 0735 06/19/18 0715  NA 131* 132* 132*  K 4.2 3.7 3.8  CL 89* 92* 91*  CO2 24 27 26   GLUCOSE 124* 116* 120*  BUN 92* 67* 57*  CREATININE 3.98* 3.12* 2.61*  CALCIUM 8.9 8.8* 9.0  PHOS 5.0* 4.1 3.5    ABG: No results for input(s): PHART, PCO2ART, PO2ART, HCO3, O2SAT in the last 168 hours.  Liver Function Tests: Recent Labs  Lab 06/15/18 0500 06/17/18 0735 06/19/18 0715  ALBUMIN 2.2* 2.3* 2.4*   No results for input(s): LIPASE, AMYLASE in the last 168 hours. No results for input(s): AMMONIA in the last 168 hours.  CBC: Recent Labs  Lab 06/15/18 0618 06/17/18 0735 06/19/18 0715  WBC 14.4* 14.4* 15.0*  HGB 8.7* 8.5* 8.6*  HCT 28.6* 28.0* 28.1*  MCV 87.7 88.6 87.8  PLT 573* 547* 541*     Cardiac Enzymes: No results for input(s): CKTOTAL, CKMB, CKMBINDEX, TROPONINI in the last 168 hours.  BNP (last 3 results) No results for input(s): BNP in the last 8760 hours.  ProBNP (last 3 results) No results for input(s): PROBNP in the last 8760 hours.  Radiological Exams: No results found.  Assessment/Plan Principal Problem:   Acute on chronic respiratory failure with hypoxia (HCC) Active Problems:   Aspiration pneumonia (HCC)   Seizure disorder (HCC)   Cardiac arrest (HCC)   End stage renal disease on dialysis (HCC)   Chronic systolic heart failure (HCC)   1. Acute on chronic respiratory failure with hypoxia we will continue weaning on the T collar again today continue with aggressive weans as tolerated. 2. Pneumonia due to aspiration clinically improving we will continue to follow 3. Seizure disorder no active seizures 4. Cardiac arrest rhythm has been stable 5. End-stage renal disease continue with dialysis 6. Tonic systolic heart failure baseline continue to monitor   I have personally seen and evaluated the patient, evaluated laboratory and imaging results, formulated the assessment and plan and placed orders. The Patient requires high complexity decision making for assessment and support.  Case was discussed on Rounds with the Respiratory Therapy Staff  Yevonne PaxSaadat A Khan, MD [facility] Pulmonary Critical Care Medicine Sleep Medicine

## 2018-06-19 NOTE — Progress Notes (Signed)
[facility]  ROUNDING NOTE   Subjective:  Patient due for hemodialysis today. Still on the ventilator. Hemoglobin appears stable at 8.6.   Objective:  Vital signs in last 24 hours:  Temperature 97.4 pulse 67 respirations 26 blood pressure 141/101  Physical Exam: General: No acute distress  Head: Dinosaur/AT NG in place  Eyes: Anicteric  Neck: Tracheostomy in place  Lungs:  Scattered rhonchi, vent assisted  Heart: S1S2 no rubs  Abdomen:  Soft, nontender, bowel sounds present  Extremities: 1+ peripheral edema.  Neurologic: Awake, follows commands  Skin: No lesions  Access: L IJ permcath    Basic Metabolic Panel: Recent Labs  Lab 06/15/18 0500 06/17/18 0735 06/19/18 0715  NA 131* 132* 132*  K 4.2 3.7 3.8  CL 89* 92* 91*  CO2 24 27 26   GLUCOSE 124* 116* 120*  BUN 92* 67* 57*  CREATININE 3.98* 3.12* 2.61*  CALCIUM 8.9 8.8* 9.0  PHOS 5.0* 4.1 3.5    Liver Function Tests: Recent Labs  Lab 06/15/18 0500 06/17/18 0735 06/19/18 0715  ALBUMIN 2.2* 2.3* 2.4*   No results for input(s): LIPASE, AMYLASE in the last 168 hours. No results for input(s): AMMONIA in the last 168 hours.  CBC: Recent Labs  Lab 06/15/18 0618 06/17/18 0735 06/19/18 0715  WBC 14.4* 14.4* 15.0*  HGB 8.7* 8.5* 8.6*  HCT 28.6* 28.0* 28.1*  MCV 87.7 88.6 87.8  PLT 573* 547* 541*    Cardiac Enzymes: No results for input(s): CKTOTAL, CKMB, CKMBINDEX, TROPONINI in the last 168 hours.  BNP: Invalid input(s): POCBNP  CBG: No results for input(s): GLUCAP in the last 168 hours.  Microbiology: Results for orders placed or performed during the hospital encounter of 05/07/2018  Culture, respiratory (non-expectorated)     Status: None   Collection Time: 05/25/18  4:10 PM  Result Value Ref Range Status   Specimen Description TRACHEAL ASPIRATE  Final   Special Requests NONE  Final   Gram Stain   Final    RARE WBC PRESENT, PREDOMINANTLY PMN FEW GRAM POSITIVE COCCI Performed at [facility], [address] [place], [place] [id number]    Culture   Final    MODERATE ESCHERICHIA COLI Confirmed Extended Spectrum Beta-Lactamase Producer (ESBL).  In bloodstream infections from ESBL organisms, carbapenems are preferred over piperacillin/tazobactam. They are shown to have a lower risk of mortality.    Report Status 05/28/2018 FINAL  Final   Organism ID, Bacteria ESCHERICHIA COLI  Final      Susceptibility   Escherichia coli - MIC*    AMPICILLIN >=32 RESISTANT Resistant     CEFAZOLIN >=64 RESISTANT Resistant     CEFEPIME RESISTANT Resistant     CEFTAZIDIME >=64 RESISTANT Resistant     CEFTRIAXONE >=64 RESISTANT Resistant     CIPROFLOXACIN >=4 RESISTANT Resistant     GENTAMICIN <=1 SENSITIVE Sensitive     IMIPENEM <=0.25 SENSITIVE Sensitive     TRIMETH/SULFA <=20 SENSITIVE Sensitive     AMPICILLIN/SULBACTAM >=32 RESISTANT Resistant     PIP/TAZO >=128 RESISTANT Resistant     Extended ESBL POSITIVE Resistant     * MODERATE ESCHERICHIA COLI  C difficile quick scan w PCR reflex     Status: Abnormal   Collection Time: 05/25/18  4:27 PM  Result Value Ref Range Status   C Diff antigen POSITIVE (A) NEGATIVE Final   C Diff toxin NEGATIVE NEGATIVE Final   C Diff interpretation Results are indeterminate. See PCR results.  Final  Comment: Performed at [facility], [address], [place], [place] [address]  C. Diff by PCR, Reflexed     Status: Abnormal   Collection Time: 05/25/18  4:27 PM  Result Value Ref Range Status   Toxigenic C. Difficile by PCR POSITIVE (A) NEGATIVE Final    Comment: Positive for toxigenic C. difficile with little to no toxin production. Only treat if clinical presentation suggests symptomatic illness. Performed at [facility], [address] [place], [place] [address]   Culture, blood (routine x 2)     Status: None   Collection Time: 05/25/18  8:20 PM  Result Value Ref Range Status   Specimen Description BLOOD LEFT WRIST  Final    Special Requests   Final    BOTTLES DRAWN AEROBIC ONLY Blood Culture results may not be optimal due to an inadequate volume of blood received in culture bottles   Culture   Final    NO GROWTH 5 DAYS Performed at [facility], [address] [place], [place] [address]    Report Status 05/30/2018 FINAL  Final  Culture, blood (routine x 2)     Status: None   Collection Time: 05/25/18  8:28 PM  Result Value Ref Range Status   Specimen Description BLOOD LEFT HAND  Final   Special Requests   Final    BOTTLES DRAWN AEROBIC ONLY Blood Culture results may not be optimal due to an inadequate volume of blood received in culture bottles   Culture   Final    NO GROWTH 5 DAYS Performed at [facility], [address] [place], [place] [address]    Report Status 05/30/2018 FINAL  Final    Coagulation Studies: No results for input(s): LABPROT, INR in the last 72 hours.  Urinalysis: No results for input(s): COLORURINE, LABSPEC, PHURINE, GLUCOSEU, HGBUR, BILIRUBINUR, KETONESUR, PROTEINUR, UROBILINOGEN, NITRITE, LEUKOCYTESUR in the last 72 hours.  Invalid input(s): APPERANCEUR    Imaging: No results found.   Medications:       Assessment/ Plan:  37 y.o. female with a PMHx of recent acute respiratory failure requiring intubation, aspiration pneumonia, cardiac arrest, chronic systolic heart failure, delay of cognitive development, end-stage renal disease on hemodialysis MWF, hypertension, seizure disorder, who was admitted to [facility] on 04/30/2018 for ongoing management of acute respiratory failure, recent PEA arrest, and end-stage renal disease.   1.  ESRD on HD.    Patient seen at bedside.  Due for hemodialysis today.  Orders have been prepared.  Thereafter next dialysis on Monday.  2.  Acute respiratory failure.  Secondary to aspiration.   -Patient has been difficult to wean from the ventilator.  Continue current ventilatory support.  3.  Anemia of chronic kidney  disease.  Hemoglobin remained stable at 8.6.  Recheck on Monday.  4.  Secondary hyperparathyroidism.  Phosphorus currently 3.5 and acceptable.  Continue to monitor.  5.  Hypertension:   Blood pressure continues to be labile and currently 141/101 but there are periods of good control.  Continue amlodipine, clonidine, hydralazine, lisinopril, and metoprolol.     LOS: 0 Raianna Slight 9/20/20198:32 AM

## 2018-06-20 ENCOUNTER — Other Ambulatory Visit (HOSPITAL_COMMUNITY): Payer: Self-pay

## 2018-06-20 DIAGNOSIS — J690 Pneumonitis due to inhalation of food and vomit: Secondary | ICD-10-CM | POA: Diagnosis not present

## 2018-06-20 DIAGNOSIS — J9621 Acute and chronic respiratory failure with hypoxia: Secondary | ICD-10-CM | POA: Diagnosis not present

## 2018-06-20 DIAGNOSIS — I469 Cardiac arrest, cause unspecified: Secondary | ICD-10-CM | POA: Diagnosis not present

## 2018-06-20 DIAGNOSIS — I5022 Chronic systolic (congestive) heart failure: Secondary | ICD-10-CM | POA: Diagnosis not present

## 2018-06-20 NOTE — Progress Notes (Signed)
Pulmonary Critical Care Medicine [facility] [place]   PULMONARY CRITICAL CARE SERVICE  PROGRESS NOTE  Date of Service: 06/20/2018  Lori Gutierrez  WGN:[id number]  DOB: 07/28/1981   DOA: 05/13/2018  Referring Physician: Carron Curie, MD  HPI: Lori Gutierrez is a 37 y.o. female seen for follow up of Acute on Chronic Respiratory Failure.  At this time patient is doing fairly well she is weaning on T collar.  She wants to tracheostomy out she wants the NG tube out explained to her that we are making progress and she needs to be patient as we move forward  Medications: Reviewed on Rounds  Physical Exam:  Vitals: Temperature 97.9 pulse 75 respiratory rate 25 blood pressure 177/112 saturations 99%  Ventilator Settings off the ventilator on T collar  . General: Comfortable at this time . Eyes: Grossly normal lids, irises & conjunctiva . ENT: grossly tongue is normal . Neck: no obvious mass . Cardiovascular: S1 S2 normal no gallop . Respiratory: No rhonchi or rales are noted . Abdomen: soft . Skin: no rash seen on limited exam . Musculoskeletal: not rigid . Psychiatric:unable to assess . Neurologic: no seizure no involuntary movements         Lab Data:   Basic Metabolic Panel: Recent Labs  Lab 06/15/18 0500 06/17/18 0735 06/19/18 0715  NA 131* 132* 132*  K 4.2 3.7 3.8  CL 89* 92* 91*  CO2 24 27 26   GLUCOSE 124* 116* 120*  BUN 92* 67* 57*  CREATININE 3.98* 3.12* 2.61*  CALCIUM 8.9 8.8* 9.0  PHOS 5.0* 4.1 3.5    ABG: No results for input(s): PHART, PCO2ART, PO2ART, HCO3, O2SAT in the last 168 hours.  Liver Function Tests: Recent Labs  Lab 06/15/18 0500 06/17/18 0735 06/19/18 0715  ALBUMIN 2.2* 2.3* 2.4*   No results for input(s): LIPASE, AMYLASE in the last 168 hours. No results for input(s): AMMONIA in the last 168 hours.  CBC: Recent Labs  Lab 06/15/18 0618 06/17/18 0735 06/19/18 0715  WBC 14.4* 14.4* 15.0*  HGB 8.7* 8.5* 8.6*  HCT 28.6*  28.0* 28.1*  MCV 87.7 88.6 87.8  PLT 573* 547* 541*    Cardiac Enzymes: No results for input(s): CKTOTAL, CKMB, CKMBINDEX, TROPONINI in the last 168 hours.  BNP (last 3 results) No results for input(s): BNP in the last 8760 hours.  ProBNP (last 3 results) No results for input(s): PROBNP in the last 8760 hours.  Radiological Exams: Dg Humerus Left  Result Date: 06/20/2018 CLINICAL DATA:  Evaluate for hematoma. EXAM: LEFT HUMERUS - 2+ VIEW COMPARISON:  CT, 05/16/2018. FINDINGS: No fracture.  No bone lesion. Elbow and shoulder joints appear normally aligned. There are surgical vascular clips along the anteromedial aspect of the distal arm just above the elbow. There is diffuse soft tissue edema. No defined hematoma. IMPRESSION: 1. No fracture, bone lesion or dislocation. 2. Diffuse soft tissue edema.  No defined hematoma. Electronically Signed   By: Amie Portland M.D.   On: 06/20/2018 15:27    Assessment/Plan Principal Problem:   Acute on chronic respiratory failure with hypoxia (HCC) Active Problems:   Aspiration pneumonia (HCC)   Seizure disorder (HCC)   Cardiac arrest (HCC)   End stage renal disease on dialysis (HCC)   Chronic systolic heart failure (HCC)   1. Acute on chronic respiratory failure with hypoxia we will wean as tolerated.  Patient is doing well tolerating the T collar will continue to advance.  Ultimately she wants to not  go back on the ventilator but we are still trying to work on whether this means comfort care or just straight DNR. 2. Aspiration pneumonia treated we will continue with supportive care. 3. Seizure disorder she has no active seizures. 4. Cardiac arrest rhythm is stable we will continue to follow 5. End-stage renal disease she is on hemodialysis which is being continued 6. Chronic systolic heart failure we will continue supportive care   I have personally seen and evaluated the patient, evaluated laboratory and imaging results, formulated the  assessment and plan and placed orders. The Patient requires high complexity decision making for assessment and support.  Case was discussed on Rounds with the Respiratory Therapy Staff  Yevonne PaxSaadat A Darryle Dennie, MD [facility] Pulmonary Critical Care Medicine Sleep Medicine

## 2018-06-21 DIAGNOSIS — J690 Pneumonitis due to inhalation of food and vomit: Secondary | ICD-10-CM | POA: Diagnosis not present

## 2018-06-21 DIAGNOSIS — J9621 Acute and chronic respiratory failure with hypoxia: Secondary | ICD-10-CM | POA: Diagnosis not present

## 2018-06-21 DIAGNOSIS — I5022 Chronic systolic (congestive) heart failure: Secondary | ICD-10-CM | POA: Diagnosis not present

## 2018-06-21 DIAGNOSIS — I469 Cardiac arrest, cause unspecified: Secondary | ICD-10-CM | POA: Diagnosis not present

## 2018-06-21 NOTE — Progress Notes (Signed)
Pulmonary Critical Care [facility] [place]   PULMONARY CRITICAL CARE SERVICE  PROGRESS NOTE  Date of Service: 06/21/2018  Lori Gutierrez  EXB:[id number]  DOB: 09/26/1981   DOA: 05/27/2018  Referring Physician: Carron CurieAli Hijazi, MD  HPI: Lori NobleMegan C Mccaughan is a 37 y.o. female seen for follow up of Acute on Chronic Respiratory Failure.  She is on T collar this morning still stating that she wants to tube out she is actually doing very well.  Apparently overnight however she was placed back on the ventilator because she was tired.  I do not see that any blood gas was drawn and I do not see any other documentation to reflect whether she was desaturating  Medications: Reviewed on Rounds  Physical Exam:  Vitals: Temperature 97.6 pulse 78 respiratory 18 blood pressure 173/95 saturations 98%  Ventilator Settings currently is on T collar  . General: Comfortable at this time . Eyes: Grossly normal lids, irises & conjunctiva . ENT: grossly tongue is normal . Neck: no obvious mass . Cardiovascular: S1 S2 normal no gallop . Respiratory: Coarse rhonchi are noted . Abdomen: soft . Skin: no rash seen on limited exam . Musculoskeletal: not rigid . Psychiatric:unable to assess . Neurologic: no seizure no involuntary movements         Lab Data:   Basic Metabolic Panel: Recent Labs  Lab 06/15/18 0500 06/17/18 0735 06/19/18 0715  NA 131* 132* 132*  K 4.2 3.7 3.8  CL 89* 92* 91*  CO2 24 27 26   GLUCOSE 124* 116* 120*  BUN 92* 67* 57*  CREATININE 3.98* 3.12* 2.61*  CALCIUM 8.9 8.8* 9.0  PHOS 5.0* 4.1 3.5    ABG: No results for input(s): PHART, PCO2ART, PO2ART, HCO3, O2SAT in the last 168 hours.  Liver Function Tests: Recent Labs  Lab 06/15/18 0500 06/17/18 0735 06/19/18 0715  ALBUMIN 2.2* 2.3* 2.4*   No results for input(s): LIPASE, AMYLASE in the last 168 hours. No results for input(s): AMMONIA in the last 168 hours.  CBC: Recent Labs  Lab 06/15/18 0618  06/17/18 0735 06/19/18 0715  WBC 14.4* 14.4* 15.0*  HGB 8.7* 8.5* 8.6*  HCT 28.6* 28.0* 28.1*  MCV 87.7 88.6 87.8  PLT 573* 547* 541*    Cardiac Enzymes: No results for input(s): CKTOTAL, CKMB, CKMBINDEX, TROPONINI in the last 168 hours.  BNP (last 3 results) No results for input(s): BNP in the last 8760 hours.  ProBNP (last 3 results) No results for input(s): PROBNP in the last 8760 hours.  Radiological Exams: Dg Humerus Left  Result Date: 06/20/2018 CLINICAL DATA:  Evaluate for hematoma. EXAM: LEFT HUMERUS - 2+ VIEW COMPARISON:  CT, 05/16/2018. FINDINGS: No fracture.  No bone lesion. Elbow and shoulder joints appear normally aligned. There are surgical vascular clips along the anteromedial aspect of the distal arm just above the elbow. There is diffuse soft tissue edema. No defined hematoma. IMPRESSION: 1. No fracture, bone lesion or dislocation. 2. Diffuse soft tissue edema.  No defined hematoma. Electronically Signed   By: Amie Portlandavid  Ormond M.D.   On: 06/20/2018 15:27    Assessment/Plan Principal Problem:   Acute on chronic respiratory failure with hypoxia (HCC) Active Problems:   Aspiration pneumonia (HCC)   Seizure disorder (HCC)   Cardiac arrest (HCC)   End stage renal disease on dialysis (HCC)   Chronic systolic heart failure (HCC)   1. Acute on chronic respiratory failure with hypoxia she is actually been weaning on T collar doing fairly well  we will continue to advance weaning. 2. Pneumonia due to aspiration treated clinically improved 3. Seizure disorder no active seizures noted 4. Cardiac arrest rhythm has been stable 5. End-stage renal disease she is on dialysis 6. Chronic systolic heart failure stable we will continue with present management   I have personally seen and evaluated the patient, evaluated laboratory and imaging results, formulated the assessment and plan and placed orders. The Patient requires high complexity decision making for assessment and  support.  Case was discussed on Rounds with the Respiratory Therapy Staff  Yevonne Pax, MD [facility] Pulmonary Critical Care Medicine Sleep Medicine

## 2018-06-22 DIAGNOSIS — J690 Pneumonitis due to inhalation of food and vomit: Secondary | ICD-10-CM | POA: Diagnosis not present

## 2018-06-22 DIAGNOSIS — I5022 Chronic systolic (congestive) heart failure: Secondary | ICD-10-CM | POA: Diagnosis not present

## 2018-06-22 DIAGNOSIS — J9621 Acute and chronic respiratory failure with hypoxia: Secondary | ICD-10-CM | POA: Diagnosis not present

## 2018-06-22 DIAGNOSIS — I469 Cardiac arrest, cause unspecified: Secondary | ICD-10-CM | POA: Diagnosis not present

## 2018-06-22 LAB — RENAL FUNCTION PANEL
ANION GAP: 16 — AB (ref 5–15)
Albumin: 2.8 g/dL — ABNORMAL LOW (ref 3.5–5.0)
BUN: 78 mg/dL — ABNORMAL HIGH (ref 6–20)
CALCIUM: 9.4 mg/dL (ref 8.9–10.3)
CO2: 24 mmol/L (ref 22–32)
Chloride: 91 mmol/L — ABNORMAL LOW (ref 98–111)
Creatinine, Ser: 3.12 mg/dL — ABNORMAL HIGH (ref 0.44–1.00)
GFR calc Af Amer: 21 mL/min — ABNORMAL LOW (ref >60)
GFR calc non Af Amer: 18 mL/min — ABNORMAL LOW (ref >60)
GLUCOSE: 111 mg/dL — AB (ref 70–99)
Phosphorus: 4.9 mg/dL — ABNORMAL HIGH (ref 2.5–4.6)
Potassium: 4.6 mmol/L (ref 3.5–5.1)
SODIUM: 131 mmol/L — AB (ref 135–145)

## 2018-06-22 LAB — CBC
HCT: 30.5 % — ABNORMAL LOW (ref 36.0–46.0)
Hemoglobin: 9.3 g/dL — ABNORMAL LOW (ref 12.0–15.0)
MCH: 27 pg (ref 26.0–34.0)
MCHC: 30.5 g/dL (ref 30.0–36.0)
MCV: 88.4 fL (ref 78.0–100.0)
Platelets: 574 10*3/uL — ABNORMAL HIGH (ref 150–400)
RBC: 3.45 MIL/uL — ABNORMAL LOW (ref 3.87–5.11)
RDW: 17.7 % — ABNORMAL HIGH (ref 11.5–15.5)
WBC: 17.5 10*3/uL — ABNORMAL HIGH (ref 4.0–10.5)

## 2018-06-22 NOTE — Progress Notes (Signed)
Pulmonary Critical Care Medicine [facility]   PULMONARY CRITICAL CARE SERVICE  PROGRESS NOTE  Date of Service: 06/22/2018  Asencion NobleMegan C Colbaugh  ZOX:[id number]  DOB: 1981-09-02   DOA: 02-07-2018  Referring Physician: Carron CurieAli Hijazi, MD  HPI: Asencion NobleMegan C Enderle is a 37 y.o. female seen for follow up of Acute on Chronic Respiratory Failure.  Patient is on T collar has been actually tolerating it quite well.  She has been going back on the ventilator at nighttime for fatigue.  Medications: Reviewed on Rounds  Physical Exam:  Vitals: Temperature 97.6 pulse 104 respiratory rate 28 blood pressure 152/69 saturations are 98%  Ventilator Settings off the ventilator on T collar FiO2 28%  . General: Comfortable at this time . Eyes: Grossly normal lids, irises & conjunctiva . ENT: grossly tongue is normal . Neck: no obvious mass . Cardiovascular: S1 S2 normal no gallop . Respiratory: No rhonchi or rales are noted at this time . Abdomen: soft . Skin: no rash seen on limited exam . Musculoskeletal: not rigid . Psychiatric:unable to assess . Neurologic: no seizure no involuntary movements         Lab Data:   Basic Metabolic Panel: Recent Labs  Lab 06/17/18 0735 06/19/18 0715 06/22/18 0715  NA 132* 132* 131*  K 3.7 3.8 4.6  CL 92* 91* 91*  CO2 27 26 24   GLUCOSE 116* 120* 111*  BUN 67* 57* 78*  CREATININE 3.12* 2.61* 3.12*  CALCIUM 8.8* 9.0 9.4  PHOS 4.1 3.5 4.9*    ABG: No results for input(s): PHART, PCO2ART, PO2ART, HCO3, O2SAT in the last 168 hours.  Liver Function Tests: Recent Labs  Lab 06/17/18 0735 06/19/18 0715 06/22/18 0715  ALBUMIN 2.3* 2.4* 2.8*   No results for input(s): LIPASE, AMYLASE in the last 168 hours. No results for input(s): AMMONIA in the last 168 hours.  CBC: Recent Labs  Lab 06/17/18 0735 06/19/18 0715 06/22/18 0715  WBC 14.4* 15.0* 17.5*  HGB 8.5* 8.6* 9.3*  HCT 28.0* 28.1* 30.5*  MCV 88.6 87.8 88.4  PLT 547* 541* 574*     Cardiac Enzymes: No results for input(s): CKTOTAL, CKMB, CKMBINDEX, TROPONINI in the last 168 hours.  BNP (last 3 results) No results for input(s): BNP in the last 8760 hours.  ProBNP (last 3 results) No results for input(s): PROBNP in the last 8760 hours.  Radiological Exams: No results found.  Assessment/Plan Principal Problem:   Acute on chronic respiratory failure with hypoxia (HCC) Active Problems:   Aspiration pneumonia (HCC)   Seizure disorder (HCC)   Cardiac arrest (HCC)   End stage renal disease on dialysis (HCC)   Chronic systolic heart failure (HCC)   1. Acute on chronic respiratory failure with hypoxia we will continue weaning on T collar as tolerated.  I would like to go 24 hours if possible. 2. Pneumonia due to aspiration treated clinically improved 3. Seizure disorder no active seizures are noted. 4. Cardiac arrest rhythm has been stable 5. End-stage renal disease on dialysis followed by nephrology 6. Chronic systolic heart failure currently is compensated no distress   I have personally seen and evaluated the patient, evaluated laboratory and imaging results, formulated the assessment and plan and placed orders. The Patient requires high complexity decision making for assessment and support.  Case was discussed on Rounds with the Respiratory Therapy Staff  Yevonne PaxSaadat A Gabor Lusk, MD [facility] Pulmonary Critical Care Medicine Sleep Medicine

## 2018-06-22 NOTE — Progress Notes (Signed)
[facility]  ROUNDING NOTE   Subjective:  Patient seen at bedside. Awake and alert at the moment. Due for dialysis today.   Objective:  Vital signs in last 24 hours:  Temperature 97.6 pulse 64 respirations 23 blood pressure 152/101  Physical Exam: General: No acute distress  Head: [place]/AT NG in place  Eyes: Anicteric  Neck: Tracheostomy in place  Lungs:  Scattered rhonchi  Heart: S1S2 no rubs  Abdomen:  Soft, nontender, bowel sounds present  Extremities: 1+ peripheral edema.  Neurologic: Awake, follows commands  Skin: No lesions  Access: L IJ permcath    Basic Metabolic Panel: Recent Labs  Lab 06/17/18 0735 06/19/18 0715  NA 132* 132*  K 3.7 3.8  CL 92* 91*  CO2 27 26  GLUCOSE 116* 120*  BUN 67* 57*  CREATININE 3.12* 2.61*  CALCIUM 8.8* 9.0  PHOS 4.1 3.5    Liver Function Tests: Recent Labs  Lab 06/17/18 0735 06/19/18 0715  ALBUMIN 2.3* 2.4*   No results for input(s): LIPASE, AMYLASE in the last 168 hours. No results for input(s): AMMONIA in the last 168 hours.  CBC: Recent Labs  Lab 06/17/18 0735 06/19/18 0715 06/22/18 0715  WBC 14.4* 15.0* 17.5*  HGB 8.5* 8.6* 9.3*  HCT 28.0* 28.1* 30.5*  MCV 88.6 87.8 88.4  PLT 547* 541* 574*    Cardiac Enzymes: No results for input(s): CKTOTAL, CKMB, CKMBINDEX, TROPONINI in the last 168 hours.  BNP: Invalid input(s): POCBNP  CBG: No results for input(s): GLUCAP in the last 168 hours.  Microbiology: Results for orders placed or performed during the hospital encounter of 22-Feb-2018  Culture, respiratory (non-expectorated)     Status: None   Collection Time: 05/25/18  4:10 PM  Result Value Ref Range Status   Specimen Description TRACHEAL ASPIRATE  Final   Special Requests NONE  Final   Gram Stain   Final    RARE WBC PRESENT, PREDOMINANTLY PMN FEW GRAM POSITIVE COCCI Performed at [facility], [address] [place], [place] [address]    Culture   Final    MODERATE ESCHERICHIA  COLI Confirmed Extended Spectrum Beta-Lactamase Producer (ESBL).  In bloodstream infections from ESBL organisms, carbapenems are preferred over piperacillin/tazobactam. They are shown to have a lower risk of mortality.    Report Status 05/28/2018 FINAL  Final   Organism ID, Bacteria ESCHERICHIA COLI  Final      Susceptibility   Escherichia coli - MIC*    AMPICILLIN >=32 RESISTANT Resistant     CEFAZOLIN >=64 RESISTANT Resistant     CEFEPIME RESISTANT Resistant     CEFTAZIDIME >=64 RESISTANT Resistant     CEFTRIAXONE >=64 RESISTANT Resistant     CIPROFLOXACIN >=4 RESISTANT Resistant     GENTAMICIN <=1 SENSITIVE Sensitive     IMIPENEM <=0.25 SENSITIVE Sensitive     TRIMETH/SULFA <=20 SENSITIVE Sensitive     AMPICILLIN/SULBACTAM >=32 RESISTANT Resistant     PIP/TAZO >=128 RESISTANT Resistant     Extended ESBL POSITIVE Resistant     * MODERATE ESCHERICHIA COLI  C difficile quick scan w PCR reflex     Status: Abnormal   Collection Time: 05/25/18  4:27 PM  Result Value Ref Range Status   C Diff antigen POSITIVE (A) NEGATIVE Final   C Diff toxin NEGATIVE NEGATIVE Final   C Diff interpretation Results are indeterminate. See PCR results.  Final    Comment: Performed at [facility], [address] [place], [place] [address]  C. Diff by PCR, Reflexed     Status: Abnormal   Collection Time: 05/25/18  4:27 PM  Result Value Ref Range Status   Toxigenic C. Difficile by PCR POSITIVE (A) NEGATIVE Final    Comment: Positive for toxigenic C. difficile with little to no toxin production. Only treat if clinical presentation suggests symptomatic illness. Performed at [facility], [address] [place], [place] [address]   Culture, blood (routine x 2)     Status: None   Collection Time: 05/25/18  8:20 PM  Result Value Ref Range Status   Specimen Description BLOOD LEFT WRIST  Final   Special Requests   Final    BOTTLES DRAWN AEROBIC ONLY Blood Culture results may not be optimal due to  an inadequate volume of blood received in culture bottles   Culture   Final    NO GROWTH 5 DAYS Performed at [facility], [address], [place], [place] [address]    Report Status 05/30/2018 FINAL  Final  Culture, blood (routine x 2)     Status: None   Collection Time: 05/25/18  8:28 PM  Result Value Ref Range Status   Specimen Description BLOOD LEFT HAND  Final   Special Requests   Final    BOTTLES DRAWN AEROBIC ONLY Blood Culture results may not be optimal due to an inadequate volume of blood received in culture bottles   Culture   Final    NO GROWTH 5 DAYS Performed at [facility], [address], [place], [place] [address]    Report Status 05/30/2018 FINAL  Final    Coagulation Studies: No results for input(s): LABPROT, INR in the last 72 hours.  Urinalysis: No results for input(s): COLORURINE, LABSPEC, PHURINE, GLUCOSEU, HGBUR, BILIRUBINUR, KETONESUR, PROTEINUR, UROBILINOGEN, NITRITE, LEUKOCYTESUR in the last 72 hours.  Invalid input(s): APPERANCEUR    Imaging: Dg Humerus Left  Result Date: 06/20/2018 CLINICAL DATA:  Evaluate for hematoma. EXAM: LEFT HUMERUS - 2+ VIEW COMPARISON:  CT, 05/16/2018. FINDINGS: No fracture.  No bone lesion. Elbow and shoulder joints appear normally aligned. There are surgical vascular clips along the anteromedial aspect of the distal arm just above the elbow. There is diffuse soft tissue edema. No defined hematoma. IMPRESSION: 1. No fracture, bone lesion or dislocation. 2. Diffuse soft tissue edema.  No defined hematoma. Electronically Signed   By: Amie Portland M.D.   On: 06/20/2018 15:27     Medications:       Assessment/ Plan:  37 y.o. female with a PMHx of recent acute respiratory failure requiring intubation, aspiration pneumonia, cardiac arrest, chronic systolic heart failure, delay of cognitive development, end-stage renal disease on hemodialysis MWF, hypertension, seizure disorder, who was [facility]2018 for ongoing management of acute respiratory failure, recent PEA arrest, and end-stage renal disease.   1.  ESRD on HD.    Patient due for hemodialysis today.  Orders have been prepared.  2.  Acute respiratory failure.  Secondary to aspiration.   -Currently on a T-piece and has been weaned from the ventilator.  There has been some progress.  3.  Anemia of chronic kidney disease.  Hemoglobin improved to 9.3.  Continue to periodically monitor.  4.  Secondary hyperparathyroidism.  Phosphorus currently 3.5 but we do plan to recheck this today.  5.  Hypertension:   Blood pressure trend from yesterday reviewed.  As before there are periods of good blood pressure control but there are many  periods when the blood pressure is high also.  Continue amlodipine, clonidine, hydralazine, lisinopril, and metoprolol.     LOS: 0 Braxdon Gappa 9/23/20198:32 AM

## 2018-06-23 DIAGNOSIS — J9621 Acute and chronic respiratory failure with hypoxia: Secondary | ICD-10-CM | POA: Diagnosis not present

## 2018-06-23 DIAGNOSIS — I469 Cardiac arrest, cause unspecified: Secondary | ICD-10-CM | POA: Diagnosis not present

## 2018-06-23 DIAGNOSIS — I5022 Chronic systolic (congestive) heart failure: Secondary | ICD-10-CM | POA: Diagnosis not present

## 2018-06-23 DIAGNOSIS — J690 Pneumonitis due to inhalation of food and vomit: Secondary | ICD-10-CM | POA: Diagnosis not present

## 2018-06-23 NOTE — Progress Notes (Signed)
Pulmonary Critical Care Medicine [facility]   PULMONARY CRITICAL CARE SERVICE  PROGRESS NOTE  Date of Service: 06/23/2018  Lori NobleMegan C Gutierrez  [id number]  DOB: [birth year]   DOA: 14-Aug-2018  Referring Physician: Carron CurieAli Hijazi, MD  HPI: Lori Gutierrez is a 37 y.o. female seen for follow up of Acute on Chronic Respiratory Failure.  She is been made comfort care right now is on T collar currently is on 28% oxygen.  Medications: Reviewed on Rounds  Physical Exam:  Vitals: Temperature 97.9 pulse 66 respiratory 21 blood pressure 134/61 saturation 99%  Ventilator Settings off the ventilator right now on T collar  . General: Comfortable at this time . Eyes: Grossly normal lids, irises & conjunctiva . ENT: grossly tongue is normal . Neck: no obvious mass . Cardiovascular: S1 S2 normal no gallop . Respiratory: No rhonchi . Abdomen: soft . Skin: no rash seen on limited exam . Musculoskeletal: not rigid . Psychiatric:unable to assess . Neurologic: no seizure no involuntary movements         Lab Data:   Basic Metabolic Panel: Recent Labs  Lab 06/17/18 0735 06/19/18 0715 06/22/18 0715  NA 132* 132* 131*  K 3.7 3.8 4.6  CL 92* 91* 91*  CO2 27 26 24   GLUCOSE 116* 120* 111*  BUN 67* 57* 78*  CREATININE 3.12* 2.61* 3.12*  CALCIUM 8.8* 9.0 9.4  PHOS 4.1 3.5 4.9*    ABG: No results for input(s): PHART, PCO2ART, PO2ART, HCO3, O2SAT in the last 168 hours.  Liver Function Tests: Recent Labs  Lab 06/17/18 0735 06/19/18 0715 06/22/18 0715  ALBUMIN 2.3* 2.4* 2.8*   No results for input(s): LIPASE, AMYLASE in the last 168 hours. No results for input(s): AMMONIA in the last 168 hours.  CBC: Recent Labs  Lab 06/17/18 0735 06/19/18 0715 06/22/18 0715  WBC 14.4* 15.0* 17.5*  HGB 8.5* 8.6* 9.3*  HCT 28.0* 28.1* 30.5*  MCV 88.6 87.8 88.4  PLT 547* 541* 574*    Cardiac Enzymes: No results for input(s): CKTOTAL, CKMB, CKMBINDEX, TROPONINI in the last 168  hours.  BNP (last 3 results) No results for input(s): BNP in the last 8760 hours.  ProBNP (last 3 results) No results for input(s): PROBNP in the last 8760 hours.  Radiological Exams: No results found.  Assessment/Plan Principal Problem:   Acute on chronic respiratory failure with hypoxia (HCC) Active Problems:   Aspiration pneumonia (HCC)   Seizure disorder (HCC)   Cardiac arrest (HCC)   End stage renal disease on dialysis (HCC)   Chronic systolic heart failure (HCC)   1. Acute on chronic respiratory failure with hypoxia patient has been taken off the ventilator she is now comfort care. 2. Seizure disorder no active seizures 3. Cardiac arrest 4. End-stage renal disease dialysis will be   I have personally seen and evaluated the patient, evaluated laboratory and imaging results, formulated the assessment and plan and placed orders. The Patient requires high complexity decision making for assessment and support.  Case was discussed on Rounds with the Respiratory Therapy Staff  Yevonne PaxSaadat A Khan, MD [facility] Pulmonary Critical Care Medicine Sleep Medicine

## 2018-06-30 DEATH — deceased

## 2019-03-09 IMAGING — DX DG CHEST 1V PORT
1 series · 1 of 1 positions shown · non-contrast
Comparison: Radiograph May 25, 2018.

CLINICAL DATA: Nasogastric tube placement.

EXAM:
PORTABLE CHEST 1 VIEW

[chest ap]
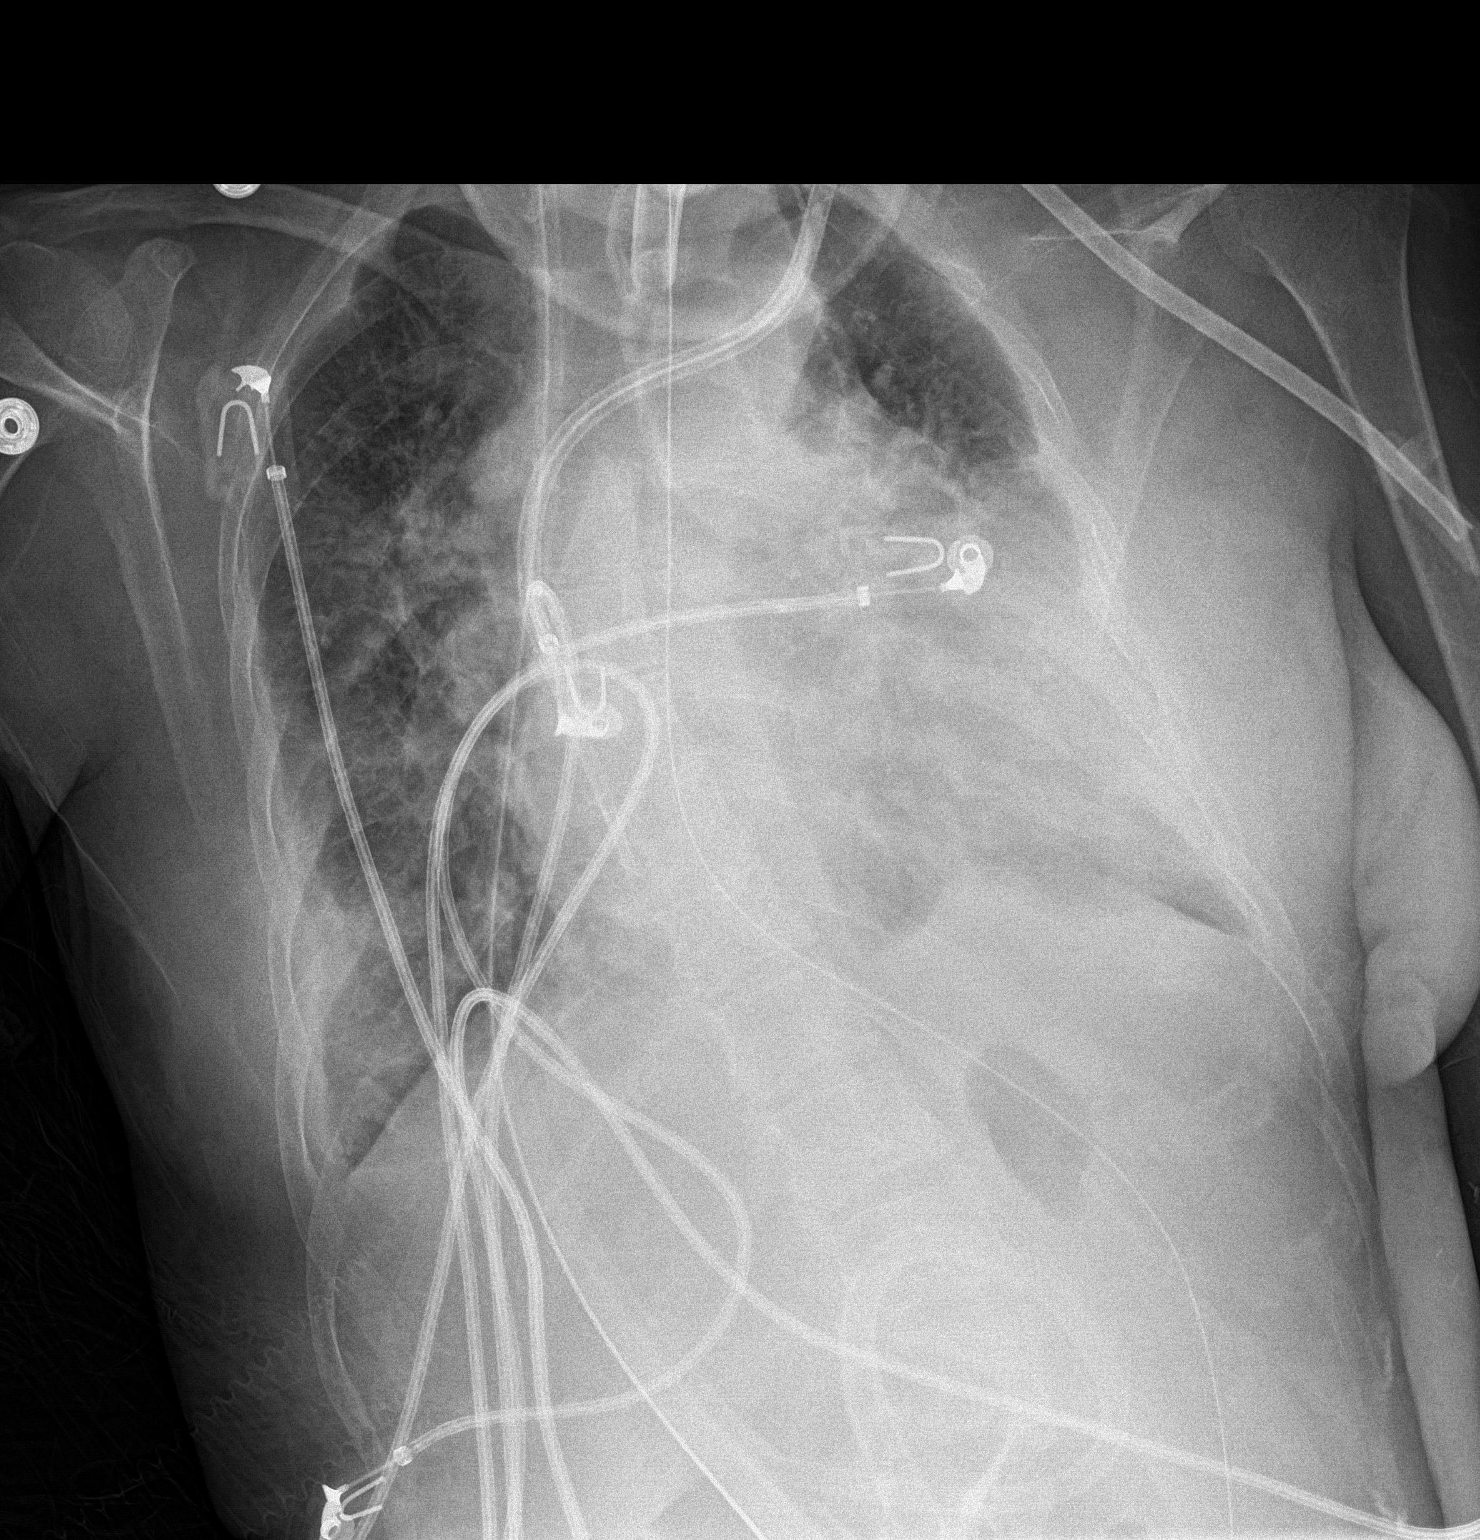

[1 of 1 positions shown; findings below may reference images not displayed]

FINDINGS: Stable cardiomegaly. Tracheostomy tube and nasogastric tube are
unchanged in position. Left internal jugular dialysis catheter is
noted with tip in expected position of right atrium. Central
pulmonary vascular congestion is noted with possible minimal
bilateral pulmonary edema. No pneumothorax or significant pleural
effusion is noted. Bony thorax is unremarkable.
IMPRESSION: Stable support apparatus. Stable cardiomegaly with central pulmonary
vascular congestion. Stable minimal bilateral pulmonary edema.

## 2019-03-09 IMAGING — DX DG ABD PORTABLE 1V
1 series · 1 of 1 positions shown · non-contrast
Comparison: Radiograph April 30, 2018.

CLINICAL DATA: Nasogastric tube.

EXAM:
PORTABLE ABDOMEN - 1 VIEW

[abdomen kub]
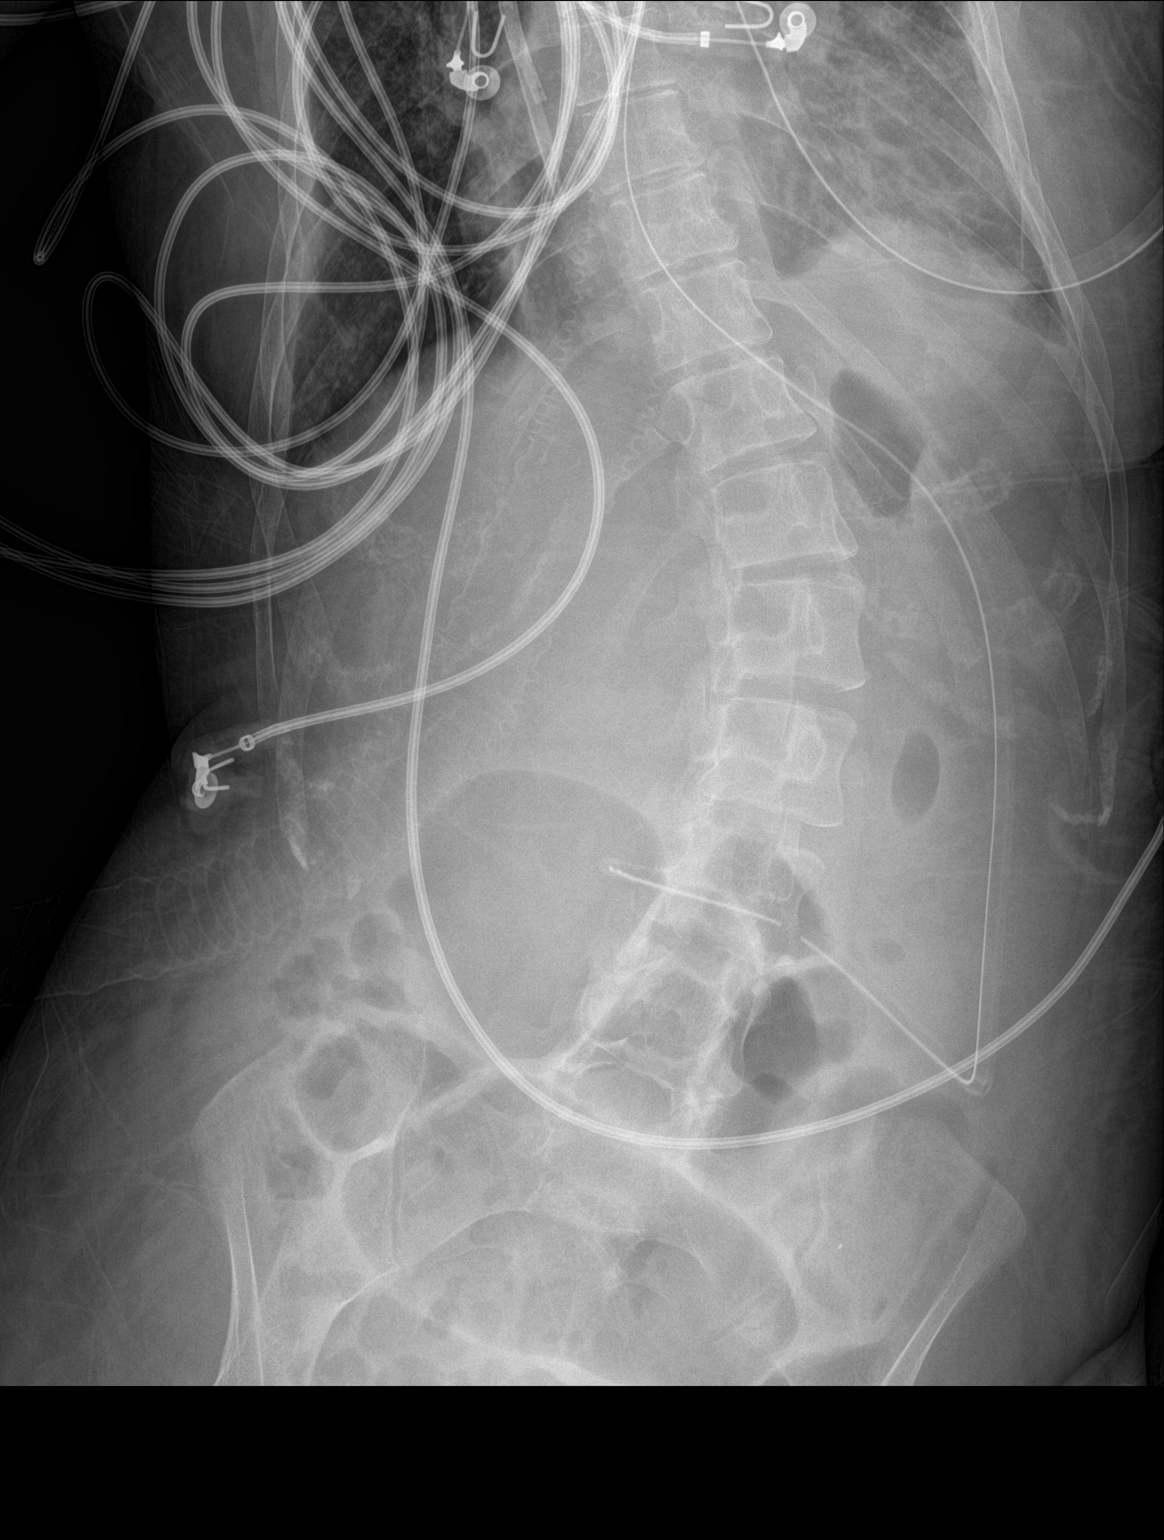

[1 of 1 positions shown; findings below may reference images not displayed]

FINDINGS: The bowel gas pattern is normal. Nasogastric tube tip is seen in
expected position of distal stomach. No radio-opaque calculi or
other significant radiographic abnormality are seen.
IMPRESSION: Nasogastric tube tip seen in expected position of distal stomach. No
definite evidence of bowel obstruction or ileus.

## 2019-03-12 IMAGING — DX DG ABD PORTABLE 1V
1 series · 1 of 1 positions shown · non-contrast
Comparison: Abdominal radiograph obtained 05/28/2018

CLINICAL DATA: 36-year-old female with ileus

EXAM:
PORTABLE ABDOMEN - 1 VIEW

[abdomen kub]
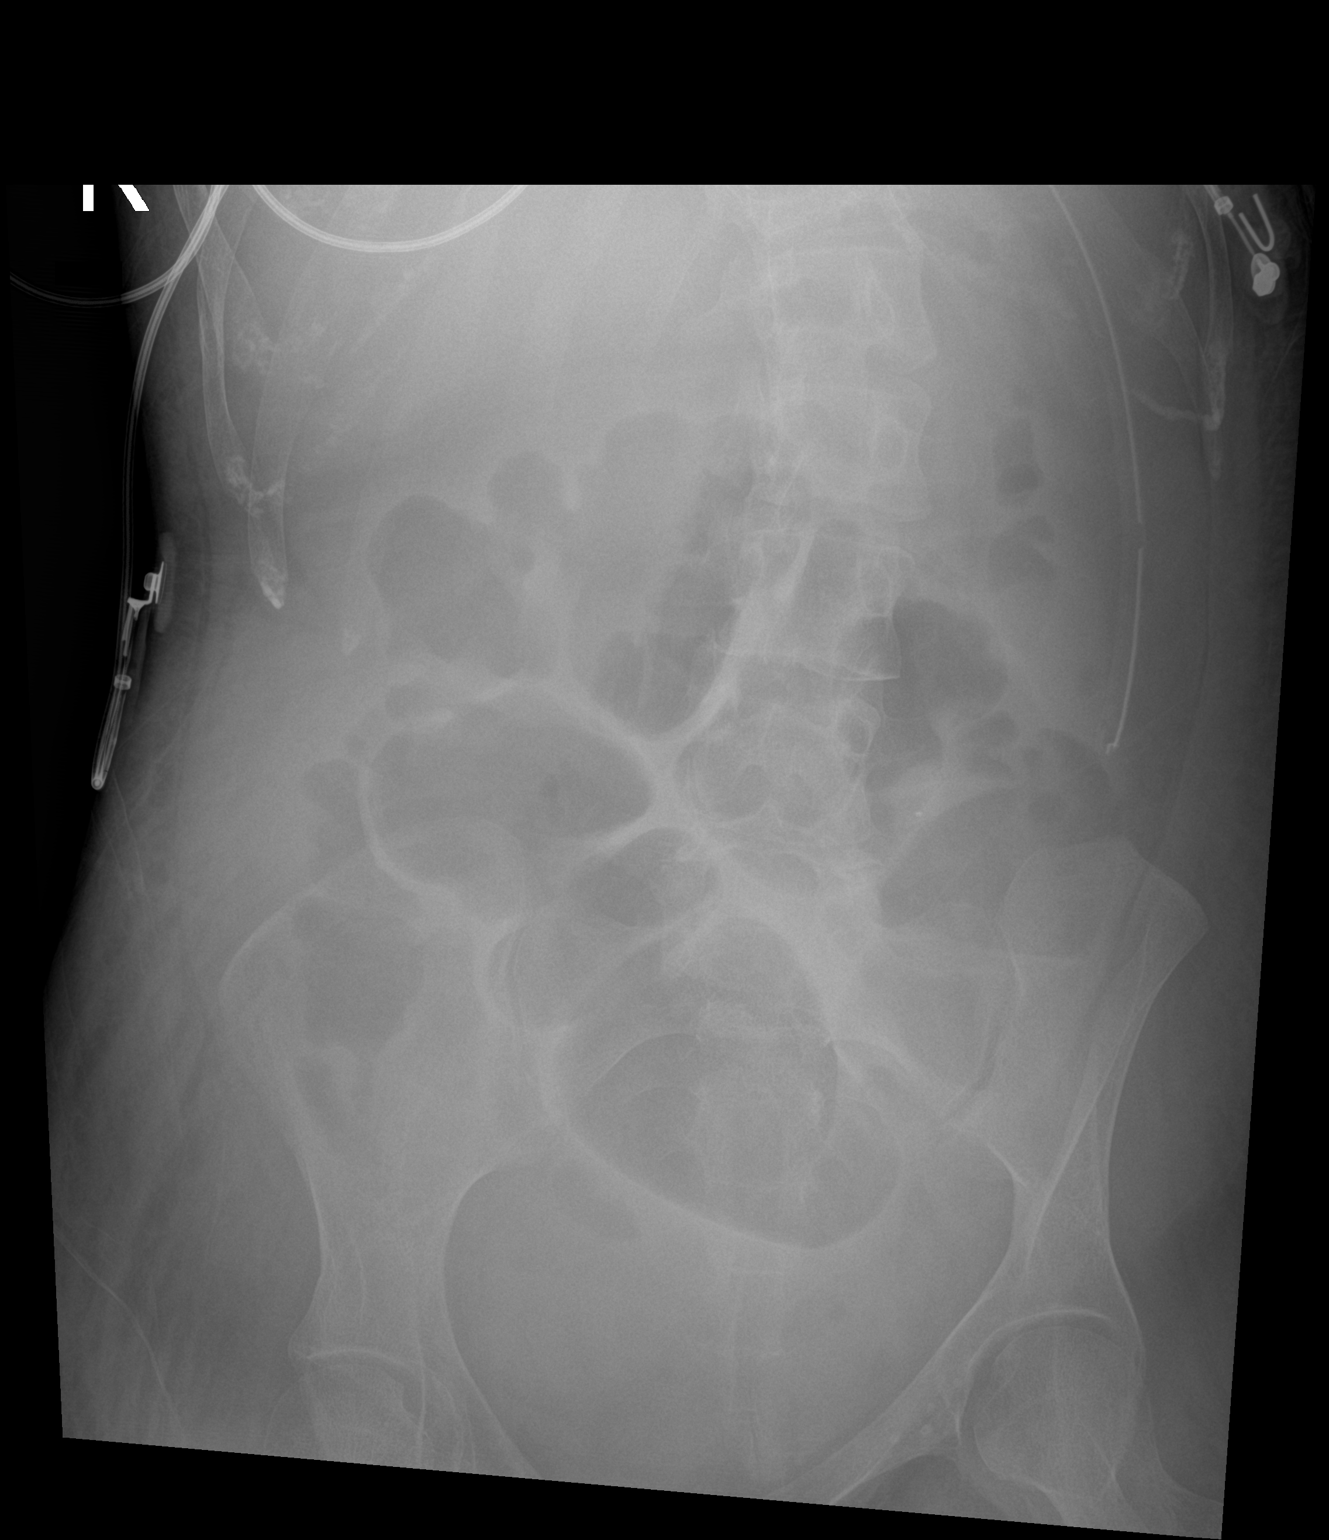

[1 of 1 positions shown; findings below may reference images not displayed]

FINDINGS: A gastric tube projects over the left abdomen. The tip of the tube
likely projects over the mid gastric body which lies quite low in
the abdomen. Persistent mild gaseous distension of the colon. Rotary
and levoconvex scoliosis again noted.
IMPRESSION: 1. Stable position of gastric tube.
2. Persisting mild gaseous distension of the colon.

## 2019-03-24 IMAGING — DX DG CHEST 1V PORT
1 series · 1 of 1 positions shown · non-contrast
Comparison: 05/31/2018

CLINICAL DATA: Follow-up pneumonia

EXAM:
PORTABLE CHEST 1 VIEW

[chest]
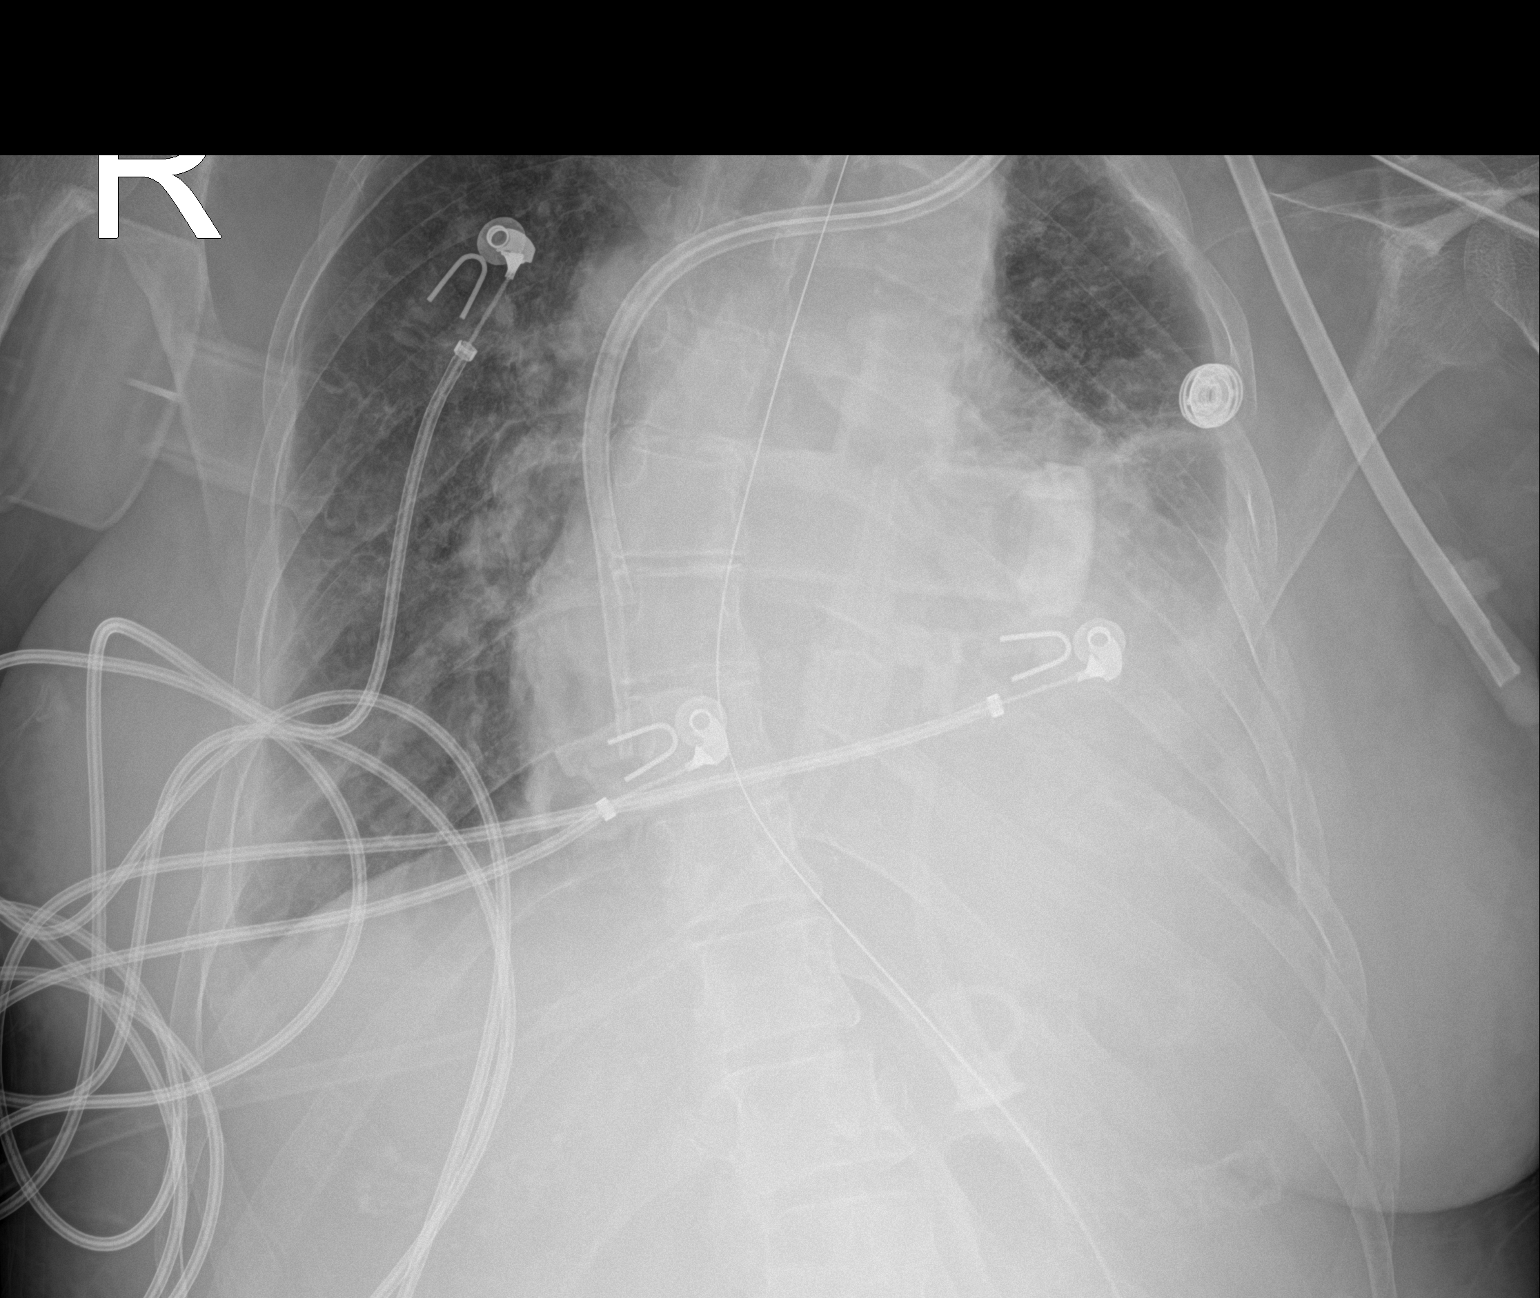

[1 of 1 positions shown; findings below may reference images not displayed]

FINDINGS: Cardiac shadow is enlarged but stable. Nasogastric catheter and
tracheostomy tube are again seen. The lungs are well aerated with
persistent left retrocardiac opacity consistent with consolidation.
Dialysis catheter is again noted and stable. No other focal
infiltrate is seen. Scoliosis is again noted and stable.
IMPRESSION: Stable consolidation in the left lower lobe.

Tubes and lines as described.

## 2019-03-24 IMAGING — DX DG ABD PORTABLE 1V
1 series · 1 of 1 positions shown · non-contrast
Comparison: June 04, 2018

CLINICAL DATA: Abdominal distension.  End-stage renal disease

EXAM:
PORTABLE ABDOMEN - 1 VIEW

[abdomen kub]
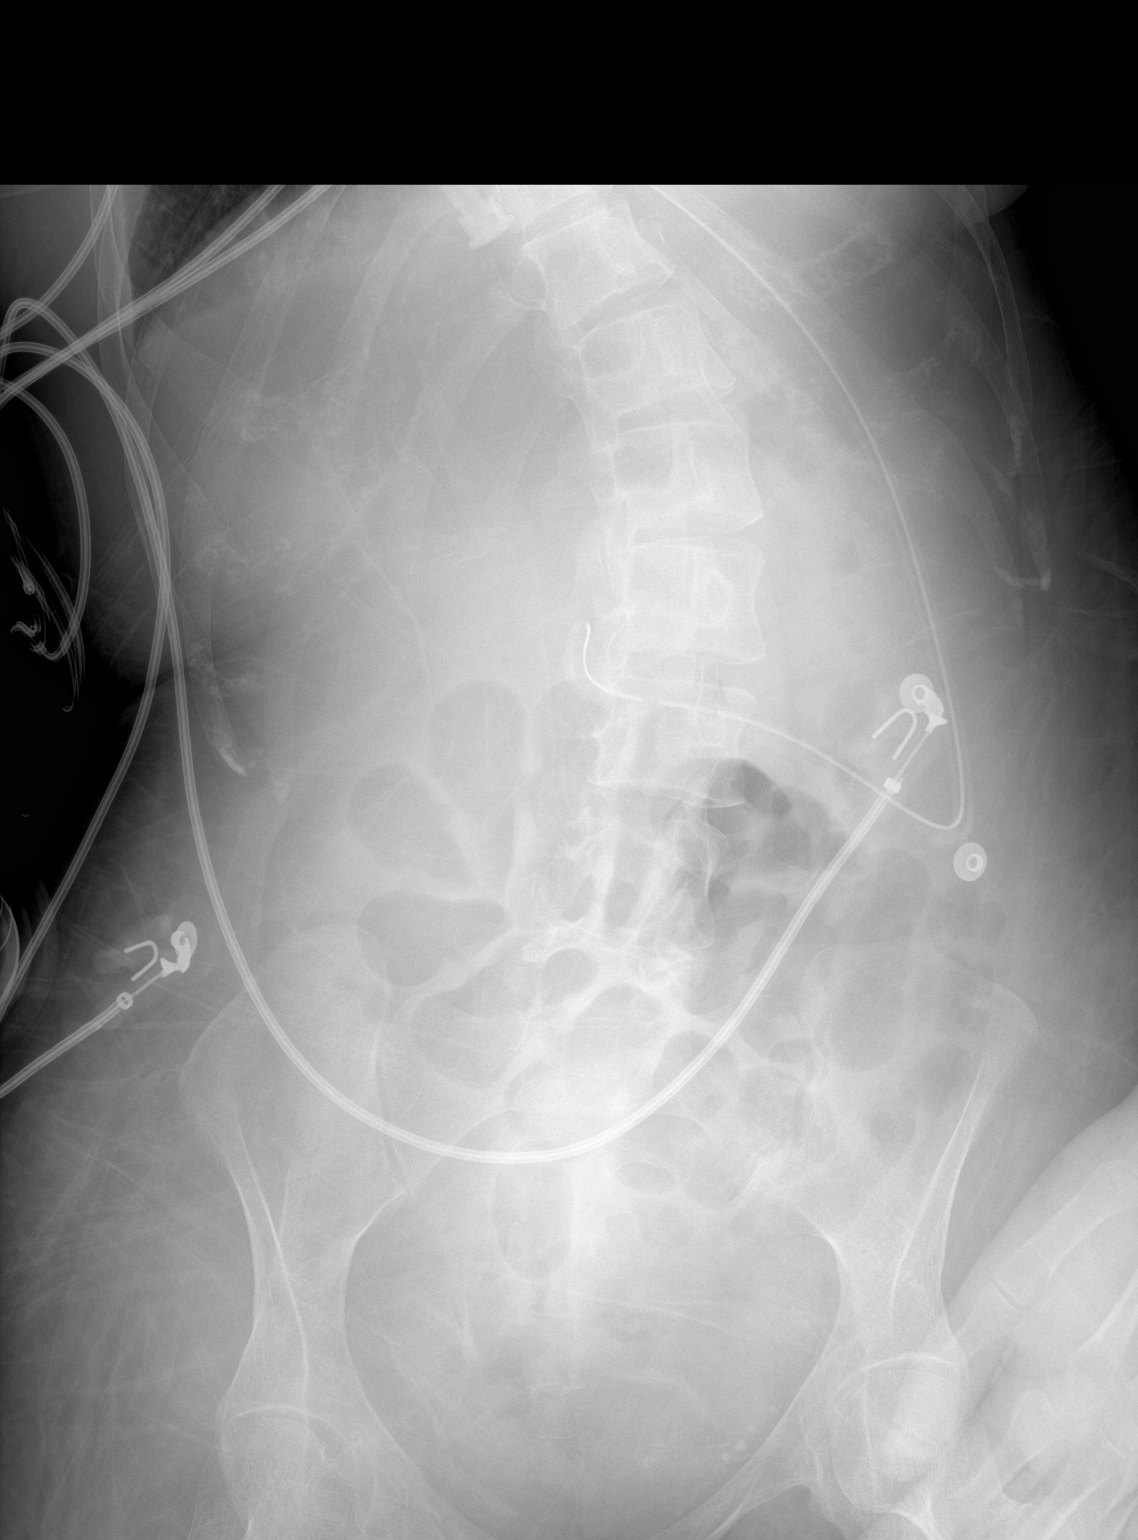

[1 of 1 positions shown; findings below may reference images not displayed]

FINDINGS: Nasogastric tube tip and side port in stomach. No bowel dilatation
or air-fluid level to suggest bowel obstruction. No evident free
air. There is consolidation with apparent pleural effusion on the
left. There is lumbar levoscoliosis.
IMPRESSION: Nasogastric tube tip and side port in stomach. Bowel gas pattern
unremarkable. Opacity left lung base region persists.
